# Patient Record
Sex: Female | Born: 1995 | Hispanic: No | Marital: Married | Smoking: Never smoker
Health system: Southern US, Community
[De-identification: ages and names within clinical notes are randomized; demographics above are authoritative.]

## PROBLEM LIST (undated history)

## (undated) ENCOUNTER — Inpatient Hospital Stay: Payer: Self-pay

## (undated) DIAGNOSIS — J45909 Unspecified asthma, uncomplicated: Secondary | ICD-10-CM

## (undated) DIAGNOSIS — O24419 Gestational diabetes mellitus in pregnancy, unspecified control: Secondary | ICD-10-CM

## (undated) DIAGNOSIS — L409 Psoriasis, unspecified: Secondary | ICD-10-CM

## (undated) DIAGNOSIS — L301 Dyshidrosis [pompholyx]: Secondary | ICD-10-CM

## (undated) DIAGNOSIS — K8050 Calculus of bile duct without cholangitis or cholecystitis without obstruction: Secondary | ICD-10-CM

## (undated) DIAGNOSIS — K810 Acute cholecystitis: Secondary | ICD-10-CM

## (undated) DIAGNOSIS — R1011 Right upper quadrant pain: Secondary | ICD-10-CM

## (undated) HISTORY — PX: NO PAST SURGERIES: SHX2092

## (undated) HISTORY — DX: Right upper quadrant pain: R10.11

## (undated) HISTORY — DX: Acute cholecystitis: K81.0

## (undated) HISTORY — DX: Calculus of bile duct without cholangitis or cholecystitis without obstruction: K80.50

## (undated) HISTORY — DX: Gestational diabetes mellitus in pregnancy, unspecified control: O24.419

---

## 2014-08-02 ENCOUNTER — Emergency Department: Payer: Self-pay | Admitting: Emergency Medicine

## 2014-09-15 DIAGNOSIS — L409 Psoriasis, unspecified: Secondary | ICD-10-CM | POA: Insufficient documentation

## 2015-09-15 DIAGNOSIS — J4520 Mild intermittent asthma, uncomplicated: Secondary | ICD-10-CM | POA: Insufficient documentation

## 2016-05-23 LAB — OB RESULTS CONSOLE RPR: RPR: NONREACTIVE

## 2016-05-23 LAB — OB RESULTS CONSOLE GC/CHLAMYDIA
Chlamydia: NEGATIVE
GC PROBE AMP, GENITAL: NEGATIVE

## 2016-05-23 LAB — OB RESULTS CONSOLE HEPATITIS B SURFACE ANTIGEN: HEP B S AG: NEGATIVE

## 2016-05-23 LAB — OB RESULTS CONSOLE VARICELLA ZOSTER ANTIBODY, IGG: Varicella: IMMUNE

## 2016-05-23 LAB — OB RESULTS CONSOLE RUBELLA ANTIBODY, IGM: RUBELLA: IMMUNE

## 2016-09-13 LAB — OB RESULTS CONSOLE HIV ANTIBODY (ROUTINE TESTING): HIV: NONREACTIVE

## 2016-09-16 NOTE — L&D Delivery Note (Signed)
Operative Delivery Note   Delivery was on 11/20/16 at 2243  At  a viable and female  was delivered via Kielland forcep.  Presentation: vtx; Position: Left,, Occiput,, Transverse; Station: +3/3.  Verbal consent: obtained from patient.  Risks and benefits discussed in detail.  Risks include, but are not limited to the risks of anesthesia, bleeding, infection, damage to maternal tissues, fetal cephalhematoma.  There is also the risk of inability to effect vaginal delivery of the head, or shoulder dystocia that cannot be resolved by established maneuvers, leading to the need for emergency cesarean section.  APGAR: , ; weight  .   Placenta status: , .   Cord:  with the following complications: .  Cord pH:  pH cord blood (arterial) 7.210 - 7.380 7.34   pCO2 cord blood (arterial) 42.0 - 56.0 mmHg 40.0    Bicarbonate 13.0 - 22.0 mmol/L 21.6      Anesthesia: local  Instruments: kielland Episiotomy:  MLE Lacerations:  3rd degree laceration , right posterior sulcus tear Suture Repair: 2.0 3.0 vicryl Est. Blood Loss (mL):  800cc  Mom to postpartum.  Baby to Couplet care / Skin to Skin.  For a full detailed account of the procedure see dictated noted Eryanna Regal 11/21/2016, 12:10 AM

## 2016-10-03 ENCOUNTER — Emergency Department: Payer: Medicaid Other

## 2016-10-03 ENCOUNTER — Encounter: Payer: Self-pay | Admitting: Urgent Care

## 2016-10-03 ENCOUNTER — Inpatient Hospital Stay
Admission: EM | Admit: 2016-10-03 | Discharge: 2016-10-05 | DRG: 781 | Disposition: A | Discharge status: Home / Self Care | Payer: Medicaid Other | Attending: Surgery | Admitting: Surgery

## 2016-10-03 DIAGNOSIS — K810 Acute cholecystitis: Secondary | ICD-10-CM | POA: Diagnosis present

## 2016-10-03 DIAGNOSIS — O99713 Diseases of the skin and subcutaneous tissue complicating pregnancy, third trimester: Secondary | ICD-10-CM | POA: Diagnosis present

## 2016-10-03 DIAGNOSIS — O99613 Diseases of the digestive system complicating pregnancy, third trimester: Principal | ICD-10-CM | POA: Diagnosis present

## 2016-10-03 DIAGNOSIS — K8042 Calculus of bile duct with acute cholecystitis without obstruction: Secondary | ICD-10-CM | POA: Diagnosis present

## 2016-10-03 DIAGNOSIS — R1010 Upper abdominal pain, unspecified: Secondary | ICD-10-CM

## 2016-10-03 DIAGNOSIS — R1011 Right upper quadrant pain: Secondary | ICD-10-CM

## 2016-10-03 DIAGNOSIS — J45909 Unspecified asthma, uncomplicated: Secondary | ICD-10-CM | POA: Diagnosis present

## 2016-10-03 DIAGNOSIS — Z3A31 31 weeks gestation of pregnancy: Secondary | ICD-10-CM

## 2016-10-03 DIAGNOSIS — L309 Dermatitis, unspecified: Secondary | ICD-10-CM | POA: Diagnosis present

## 2016-10-03 DIAGNOSIS — L409 Psoriasis, unspecified: Secondary | ICD-10-CM | POA: Diagnosis present

## 2016-10-03 DIAGNOSIS — Z7952 Long term (current) use of systemic steroids: Secondary | ICD-10-CM

## 2016-10-03 DIAGNOSIS — K8050 Calculus of bile duct without cholangitis or cholecystitis without obstruction: Secondary | ICD-10-CM

## 2016-10-03 DIAGNOSIS — O99513 Diseases of the respiratory system complicating pregnancy, third trimester: Secondary | ICD-10-CM | POA: Diagnosis present

## 2016-10-03 DIAGNOSIS — K219 Gastro-esophageal reflux disease without esophagitis: Secondary | ICD-10-CM | POA: Diagnosis present

## 2016-10-03 DIAGNOSIS — K838 Other specified diseases of biliary tract: Secondary | ICD-10-CM

## 2016-10-03 HISTORY — DX: Psoriasis, unspecified: L40.9

## 2016-10-03 HISTORY — DX: Unspecified asthma, uncomplicated: J45.909

## 2016-10-03 HISTORY — DX: Dyshidrosis (pompholyx): L30.1

## 2016-10-03 LAB — COMPREHENSIVE METABOLIC PANEL
ALT: 10 U/L — ABNORMAL LOW (ref 14–54)
ANION GAP: 7 (ref 5–15)
AST: 17 U/L (ref 15–41)
Albumin: 2.9 g/dL — ABNORMAL LOW (ref 3.5–5.0)
Alkaline Phosphatase: 126 U/L (ref 38–126)
BILIRUBIN TOTAL: 0.3 mg/dL (ref 0.3–1.2)
BUN: <5 mg/dL — ABNORMAL LOW (ref 6–20)
CHLORIDE: 109 mmol/L (ref 101–111)
CO2: 21 mmol/L — ABNORMAL LOW (ref 22–32)
Calcium: 8.7 mg/dL — ABNORMAL LOW (ref 8.9–10.3)
Creatinine, Ser: 0.35 mg/dL — ABNORMAL LOW (ref 0.44–1.00)
GFR calc Af Amer: >60 mL/min (ref >60)
GFR calc non Af Amer: >60 mL/min (ref >60)
GLUCOSE: 92 mg/dL (ref 65–99)
POTASSIUM: 3.2 mmol/L — AB (ref 3.5–5.1)
Sodium: 137 mmol/L (ref 135–145)
TOTAL PROTEIN: 6.4 g/dL — AB (ref 6.5–8.1)

## 2016-10-03 LAB — CBC
HEMATOCRIT: 35 % (ref 35.0–47.0)
Hemoglobin: 12.1 g/dL (ref 12.0–16.0)
MCH: 30.1 pg (ref 26.0–34.0)
MCHC: 34.6 g/dL (ref 32.0–36.0)
MCV: 86.9 fL (ref 80.0–100.0)
PLATELETS: 193 10*3/uL (ref 150–440)
RBC: 4.03 MIL/uL (ref 3.80–5.20)
RDW: 14 % (ref 11.5–14.5)
WBC: 13.1 10*3/uL — ABNORMAL HIGH (ref 3.6–11.0)

## 2016-10-03 LAB — TROPONIN I

## 2016-10-03 LAB — LIPASE, BLOOD: Lipase: 24 U/L (ref 11–51)

## 2016-10-03 MED ORDER — GI COCKTAIL ~~LOC~~
ORAL | Status: AC
  Filled 2016-10-03: qty 30

## 2016-10-03 MED ORDER — MORPHINE SULFATE (PF) 2 MG/ML IV SOLN
2.0000 mg | Freq: Once | INTRAVENOUS | Status: AC
  Administered 2016-10-03: 2 mg via INTRAVENOUS
  Filled 2016-10-03: qty 1

## 2016-10-03 MED ORDER — PROMETHAZINE HCL 25 MG/ML IJ SOLN
12.5000 mg | Freq: Once | INTRAMUSCULAR | Status: AC
  Administered 2016-10-03: 12.5 mg via INTRAVENOUS
  Filled 2016-10-03: qty 1

## 2016-10-03 MED ORDER — MORPHINE SULFATE (PF) 4 MG/ML IV SOLN
4.0000 mg | Freq: Once | INTRAVENOUS | Status: AC
  Administered 2016-10-04: 4 mg via INTRAVENOUS
  Filled 2016-10-03: qty 1

## 2016-10-03 NOTE — ED Notes (Signed)
Patient transported to Ultrasound 

## 2016-10-03 NOTE — ED Triage Notes (Addendum)
Patient presents with c/o RUQ abdominal paint with (+) radiation into epigastric and and up into center of chest. Pain started approximately 1 hour ago. Patient with referred pain to RIGHT shoulder. (+) N/V and shortness of breath reported. Patient is [place]; for religious reasons, she would not allow EKG to be done in triage citing that she could not remove her clothing or wrap in front of a female. Patient is an approximate 588 month primigravid female.

## 2016-10-03 NOTE — ED Provider Notes (Addendum)
[facility] Emergency Department Provider Note   ____________________________________________   First MD Initiated Contact with Patient 10/03/16 2137     (approximate)  I have reviewed the triage vital signs and the nursing notes.   HISTORY  Chief Complaint Abdominal Pain    HPI Cheryl Wong is a 21 y.o. female comes in with family reports that she had a grilled chicken sandwich and then vomited and developed pain in the epigastric area rotated radiating through to the back into the right shoulder. Pain is severe seems to be sharp and is associated with nausea area patient is 8 months pregnant. Patient reports the baby is moving normally   History reviewed. No pertinent past medical history.  There are no active problems to display for this patient.   History reviewed. No pertinent surgical history.  Prior to Admission medications   Not on File    Allergies Patient has no known allergies.  No family history on file.  Social History Social History  Substance Use Topics  . Smoking status: Never Smoker  . Smokeless tobacco: Never Used  . Alcohol use No    Review of Systems Constitutional: No fever/chills Eyes: No visual changes. ENT: No sore throat. Cardiovascular: Denies chest pain. Respiratory: Denies shortness of breath. Gastrointestinal:See history of present illness Genitourinary: Negative for dysuria. Musculoskeletal: Negative for back pain. Skin: Negative for rash. Neurological: Negative for headaches, focal weakness or numbness.  10-point ROS otherwise negative.  ____________________________________________   PHYSICAL EXAM:  VITAL SIGNS: ED Triage Vitals  Enc Vitals Group     BP 10/03/16 2132 119/73     Pulse Rate 10/03/16 2132 99     Resp 10/03/16 2132 20     Temp 10/03/16 2132 97.9 F (36.6 C)     Temp Source 10/03/16 2132 Oral     SpO2 10/03/16 2132 97 %     Weight 10/03/16 2128 155 lb (70.3 kg)     Height  10/03/16 2128 4\' 11"  (1.499 m)     Head Circumference --      Peak Flow --      Pain Score 10/03/16 2129 10     Pain Loc --      Pain Edu? --      Excl. in GC? --    Constitutional: Alert and oriented. Well appearing and in no acute distress. Eyes: Conjunctivae are normal. PERRL. EOMI. Head: Atraumatic. Nose: No congestion/rhinnorhea. Mouth/Throat: Mucous membranes are moist.  Oropharynx non-erythematous. Neck: No stridor.  Cardiovascular: Normal rate, regular rhythm. Grossly normal heart sounds.  Good peripheral circulation. Respiratory: Normal respiratory effort.  No retractions. Lungs CTAB. Gastrointestinal: Soft Tender in the epigastric area to palpation percussion. Deep breathing while palpating in the epigastrium and right upper quadrant causes the patient stopped breathing because of pain No distention. No abdominal bruits. Marland Kitchen. there is no tenderness over the uterus Musculoskeletal: No lower extremity tenderness nor edema.  No joint effusions. Neurologic:  Normal speech and language. No gross focal neurologic deficits are appreciated. No gait instability. Skin:  Skin is warm, dry and intact. No rash noted.   ____________________________________________   LABS (all labs ordered are listed, but only abnormal results are displayed)  Labs Reviewed  CBC - Abnormal; Notable for the following:       Result Value   WBC 13.1 (*)    All other components within normal limits  COMPREHENSIVE METABOLIC PANEL - Abnormal; Notable for the following:    Potassium 3.2 (*)  CO2 21 (*)    BUN <5 (*)    Creatinine, Ser 0.35 (*)    Calcium 8.7 (*)    Total Protein 6.4 (*)    Albumin 2.9 (*)    ALT 10 (*)    All other components within normal limits  TROPONIN I  LIPASE, BLOOD   ____________________________________________  EKG EKG read and interpreted by me shows normal sinus rhythm rate of 92 normal axis no acute ST-T wave  changes ____________________________________________  RADIOLOGY  dy Result   CLINICAL DATA:  RIGHT upper quadrant pain.  EXAM: US ABDOMEN LIMITED - RIGHT UPPER QUADRANT  COMPARISON:  None.  FINDINGS: Gallbladder:  Echogenic mobile 3.1 x 2.5 x 2.1 cm collection of tiny gallstones versus gallbladder sludge with intermediate acoustic shadowing. No gallbladder wall thickening or gallbladder distention. No pericholecystic fluid. No sonographic Murphy's sign elicited.  Common bile duct:  Diameter: 3 mm  Liver:  No focal lesion identified. Within normal limits in parenchymal echogenicity.  Included view of the RIGHT kidney demonstrates mild hydronephrosis.  IMPRESSION: 3.1 cm gallbladder sludge ball versus collection of small gallstones without sonographic findings of acute cholecystitis.  Incidental note of mild RIGHT hydronephrosis.   Electronically Signed   By: Awilda Metro M.D.   On: 10/03/2016 22:38    ____________________________________________   PROCEDURES  Procedure(s) performed:  Procedures  Critical Care performed:   ____________________________________________   INITIAL IMPRESSION / ASSESSMENT AND PLAN / ED COURSE  Pertinent labs & imaging results that were available during my care of the patient were reviewed by me and considered in my medical decision making (see chart for details).  Surgeon is coming to evaluate the patient. Disposition per the surgeon.      ____________________________________________   FINAL CLINICAL IMPRESSION(S) / ED DIAGNOSES  Final diagnoses:  Pain of upper abdomen      NEW MEDICATIONS STARTED DURING THIS VISIT:  New Prescriptions   No medications on file     Note:  This document was prepared using Dragon voice recognition software and may include unintentional dictation errors.    Arnaldo Natal, MD 10/03/16 [phone] Surgeon comes down to see the patient wants to wait for couple  hours to see how she does. Patient is reluctant to be admitted to the hospital and want to stay in the ER so Dr. Zenda Alpers will The patient for a couple hours until the surgeon can reexamine her and decide on admission or discharge home.   Arnaldo Natal, MD 10/04/16 [phone]

## 2016-10-04 ENCOUNTER — Encounter: Payer: Self-pay | Admitting: Obstetrics and Gynecology

## 2016-10-04 DIAGNOSIS — K219 Gastro-esophageal reflux disease without esophagitis: Secondary | ICD-10-CM | POA: Diagnosis present

## 2016-10-04 DIAGNOSIS — J45909 Unspecified asthma, uncomplicated: Secondary | ICD-10-CM | POA: Diagnosis present

## 2016-10-04 DIAGNOSIS — L409 Psoriasis, unspecified: Secondary | ICD-10-CM | POA: Diagnosis present

## 2016-10-04 DIAGNOSIS — K8042 Calculus of bile duct with acute cholecystitis without obstruction: Secondary | ICD-10-CM | POA: Diagnosis present

## 2016-10-04 DIAGNOSIS — R109 Unspecified abdominal pain: Secondary | ICD-10-CM | POA: Diagnosis present

## 2016-10-04 DIAGNOSIS — R1011 Right upper quadrant pain: Secondary | ICD-10-CM | POA: Diagnosis not present

## 2016-10-04 DIAGNOSIS — K810 Acute cholecystitis: Secondary | ICD-10-CM | POA: Diagnosis not present

## 2016-10-04 DIAGNOSIS — Z7952 Long term (current) use of systemic steroids: Secondary | ICD-10-CM | POA: Diagnosis not present

## 2016-10-04 DIAGNOSIS — L309 Dermatitis, unspecified: Secondary | ICD-10-CM | POA: Diagnosis present

## 2016-10-04 DIAGNOSIS — K8050 Calculus of bile duct without cholangitis or cholecystitis without obstruction: Secondary | ICD-10-CM

## 2016-10-04 DIAGNOSIS — O99513 Diseases of the respiratory system complicating pregnancy, third trimester: Secondary | ICD-10-CM | POA: Diagnosis present

## 2016-10-04 DIAGNOSIS — O99613 Diseases of the digestive system complicating pregnancy, third trimester: Secondary | ICD-10-CM | POA: Diagnosis present

## 2016-10-04 DIAGNOSIS — Z3A31 31 weeks gestation of pregnancy: Secondary | ICD-10-CM | POA: Diagnosis not present

## 2016-10-04 DIAGNOSIS — O99713 Diseases of the skin and subcutaneous tissue complicating pregnancy, third trimester: Secondary | ICD-10-CM | POA: Diagnosis present

## 2016-10-04 HISTORY — DX: Acute cholecystitis: K81.0

## 2016-10-04 LAB — CBC WITH DIFFERENTIAL/PLATELET
Basophils Absolute: 0.1 10*3/uL (ref 0–0.1)
Basophils Relative: 1 %
EOS PCT: 1 %
Eosinophils Absolute: 0.1 10*3/uL (ref 0–0.7)
HEMATOCRIT: 33.9 % — AB (ref 35.0–47.0)
Hemoglobin: 11.8 g/dL — ABNORMAL LOW (ref 12.0–16.0)
LYMPHS ABS: 1.5 10*3/uL (ref 1.0–3.6)
LYMPHS PCT: 14 %
MCH: 30.4 pg (ref 26.0–34.0)
MCHC: 34.8 g/dL (ref 32.0–36.0)
MCV: 87.1 fL (ref 80.0–100.0)
Monocytes Absolute: 0.7 10*3/uL (ref 0.2–0.9)
Monocytes Relative: 6 %
NEUTROS ABS: 8.3 10*3/uL — AB (ref 1.4–6.5)
NEUTROS PCT: 78 %
PLATELETS: 179 10*3/uL (ref 150–440)
RBC: 3.9 MIL/uL (ref 3.80–5.20)
RDW: 13.5 % (ref 11.5–14.5)
WBC: 10.6 10*3/uL (ref 3.6–11.0)

## 2016-10-04 LAB — BASIC METABOLIC PANEL
ANION GAP: 7 (ref 5–15)
BUN: <5 mg/dL — ABNORMAL LOW (ref 6–20)
CALCIUM: 8.6 mg/dL — AB (ref 8.9–10.3)
CO2: 21 mmol/L — ABNORMAL LOW (ref 22–32)
Chloride: 110 mmol/L (ref 101–111)
Creatinine, Ser: 0.3 mg/dL — ABNORMAL LOW (ref 0.44–1.00)
GFR calc Af Amer: >60 mL/min (ref >60)
GLUCOSE: 76 mg/dL (ref 65–99)
POTASSIUM: 3 mmol/L — AB (ref 3.5–5.1)
Sodium: 138 mmol/L (ref 135–145)

## 2016-10-04 LAB — MAGNESIUM: MAGNESIUM: 1.5 mg/dL — AB (ref 1.7–2.4)

## 2016-10-04 MED ORDER — ALBUTEROL SULFATE (2.5 MG/3ML) 0.083% IN NEBU: 3.0000 mL | INHALATION_SOLUTION | Freq: Four times a day (QID) | RESPIRATORY_TRACT | Status: DC | PRN

## 2016-10-04 MED ORDER — FAMOTIDINE IN NACL 20-0.9 MG/50ML-% IV SOLN
20.0000 mg | Freq: Two times a day (BID) | INTRAVENOUS | Status: DC
  Administered 2016-10-04: 20 mg via INTRAVENOUS

## 2016-10-04 MED ORDER — LACTATED RINGERS IV SOLN
INTRAVENOUS | Status: DC
  Administered 2016-10-04 – 2016-10-05 (×5): via INTRAVENOUS

## 2016-10-04 MED ORDER — ACETAMINOPHEN 500 MG PO TABS
1000.0000 mg | ORAL_TABLET | Freq: Four times a day (QID) | ORAL | Status: DC
  Administered 2016-10-04 – 2016-10-05 (×3): 1000 mg via ORAL
  Filled 2016-10-04 (×3): qty 2

## 2016-10-04 MED ORDER — HYDROMORPHONE HCL 1 MG/ML IJ SOLN: 0.5000 mg | INTRAMUSCULAR | Status: DC | PRN

## 2016-10-04 MED ORDER — HYDROMORPHONE HCL 1 MG/ML IJ SOLN
0.5000 mg | Freq: Once | INTRAMUSCULAR | Status: AC
  Administered 2016-10-04: 0.5 mg via INTRAVENOUS

## 2016-10-04 MED ORDER — SODIUM CHLORIDE 0.9 % IV SOLN
3.0000 g | Freq: Four times a day (QID) | INTRAVENOUS | Status: DC
  Administered 2016-10-04 – 2016-10-05 (×6): 3 g via INTRAVENOUS
  Filled 2016-10-04 (×9): qty 3

## 2016-10-04 MED ORDER — HYDROMORPHONE HCL 1 MG/ML IJ SOLN
INTRAMUSCULAR | Status: AC
  Administered 2016-10-04: 0.5 mg via INTRAVENOUS
  Filled 2016-10-04: qty 1

## 2016-10-04 MED ORDER — POTASSIUM CHLORIDE CRYS ER 20 MEQ PO TBCR
40.0000 meq | EXTENDED_RELEASE_TABLET | Freq: Once | ORAL | Status: AC
  Administered 2016-10-04: 40 meq via ORAL
  Filled 2016-10-04: qty 2

## 2016-10-04 MED ORDER — MAGNESIUM SULFATE 2 GM/50ML IV SOLN
2.0000 g | Freq: Once | INTRAVENOUS | Status: AC
  Administered 2016-10-04: 2 g via INTRAVENOUS
  Filled 2016-10-04: qty 50

## 2016-10-04 MED ORDER — ACETAMINOPHEN 500 MG PO TABS
1000.0000 mg | ORAL_TABLET | Freq: Four times a day (QID) | ORAL | Status: DC
  Administered 2016-10-04 (×3): 1000 mg via ORAL
  Filled 2016-10-04 (×3): qty 2

## 2016-10-04 MED ORDER — ENOXAPARIN SODIUM 40 MG/0.4ML ~~LOC~~ SOLN
40.0000 mg | SUBCUTANEOUS | Status: DC
  Administered 2016-10-04 – 2016-10-05 (×2): 40 mg via SUBCUTANEOUS
  Filled 2016-10-04 (×2): qty 0.4

## 2016-10-04 MED ORDER — HYDROMORPHONE HCL 1 MG/ML PO LIQD: 0.5000 mg | Freq: Once | ORAL | Status: DC

## 2016-10-04 MED ORDER — FAMOTIDINE IN NACL 20-0.9 MG/50ML-% IV SOLN
INTRAVENOUS | Status: AC
  Administered 2016-10-04: 20 mg via INTRAVENOUS
  Filled 2016-10-04: qty 50

## 2016-10-04 MED ORDER — PANTOPRAZOLE SODIUM 40 MG PO TBEC
40.0000 mg | DELAYED_RELEASE_TABLET | Freq: Every day | ORAL | Status: DC
  Administered 2016-10-04 – 2016-10-05 (×2): 40 mg via ORAL
  Filled 2016-10-04 (×2): qty 1

## 2016-10-04 MED ORDER — ACETAMINOPHEN 500 MG PO TABS
ORAL_TABLET | ORAL | Status: AC
  Administered 2016-10-04: 1000 mg via ORAL
  Filled 2016-10-04: qty 2

## 2016-10-04 MED ORDER — ONDANSETRON HCL 4 MG/2ML IJ SOLN: 4.0000 mg | Freq: Four times a day (QID) | INTRAMUSCULAR | Status: DC | PRN

## 2016-10-04 MED ORDER — ONDANSETRON 4 MG PO TBDP: 4.0000 mg | ORAL_TABLET | Freq: Four times a day (QID) | ORAL | Status: DC | PRN

## 2016-10-04 NOTE — H&P (Signed)
Date of Admission:  10/04/2016  Reason for Admission:  Abdominal pain  History of Present Illness: Cheryl Wong is a 21 y.o. female who presents with a one day history of abdominal pain.  Patient reports that she started having abdominal pain in the epigastric and right upper quadrant areas last night around 8-9 pm.  She had a chicken salad sandwich for dinner.  Associated with nausea and one episode of emesis prior to arrival to ED.  Reports she feels a stabbing type of pain, radiating towards the back as well.  No prior episodes of pain like this in the past.  Denies any fevers, chills, chest pain, shortness of breath, other areas or abdominal pain, dysuria, hematuria, blood in the stool.  Patient is 8 months pregnant and has her OB care at a community [facility] center.  Denies any complications with her pregnancy.  Past Medical History: History reviewed. No pertinent past medical history.   Past Surgical History: History reviewed. No pertinent surgical history.  Home Medications: Prior to Admission medications   Not on File    Allergies: No Known Allergies  Social History:  reports that she has never smoked. She has never used smokeless tobacco. She reports that she does not drink alcohol. Her drug history is not on file.   Family History: No family history on file.  Review of Systems: Review of Systems  Constitutional: Negative for chills and fever.  HENT: Negative for hearing loss.   Eyes: Negative for blurred vision.  Respiratory: Negative for cough and shortness of breath.   Cardiovascular: Negative for chest pain and leg swelling.  Gastrointestinal: Positive for abdominal pain, nausea and vomiting. Negative for constipation, diarrhea and heartburn.  Genitourinary: Negative for dysuria and hematuria.  Musculoskeletal: Negative for myalgias.  Skin: Negative for rash.  Neurological: Negative for dizziness.  Psychiatric/Behavioral: Negative for depression.  All other  systems reviewed and are negative.   Physical Exam BP 119/70 (BP Location: Right Arm)   Pulse 95   Temp 97.9 F (36.6 C) (Oral)   Resp 18   Ht 4\' 11"  (1.499 m)   Wt 70.3 kg (155 lb)   SpO2 100%   BMI 31.31 kg/m  CONSTITUTIONAL: No acute distress HEENT:  Normocephalic, atraumatic, extraocular motion intact. NECK: Trachea is midline, and there is no jugular venous distension. RESPIRATORY:  Lungs are clear, and breath sounds are equal bilaterally. Normal respiratory effort without pathologic use of accessory muscles. CARDIOVASCULAR: Heart is regular without murmurs, gallops, or rubs. GI: The abdomen is soft, nondistended, tender to palpation in the epigastric and right upper quadrant.  Positive Murphy's sign.  Uterus size consistent with 8 month pregnancy. MUSCULOSKELETAL:  Normal muscle strength and tone in all four extremities.  No peripheral edema or cyanosis. SKIN: Skin turgor is normal. There are no pathologic skin lesions.  NEUROLOGIC:  Motor and sensation is grossly normal.  Cranial nerves are grossly intact. PSYCH:  Alert and oriented to person, place and time. Affect is normal.  Laboratory Analysis: Results for orders placed or performed during the hospital encounter of 10/03/16 (from the past 24 hour(s))  CBC     Status: Abnormal   Collection Time: 10/03/16  9:45 PM  Result Value Ref Range   WBC 13.1 (H) 3.6 - 11.0 K/uL   RBC 4.03 3.80 - 5.20 MIL/uL   Hemoglobin 12.1 12.0 - 16.0 g/dL   HCT 40.935.0 81.135.0 - 91.447.0 %   MCV 86.9 80.0 - 100.0 fL   MCH 30.1 26.0 -  34.0 pg   MCHC 34.6 32.0 - 36.0 g/dL   RDW 16.1 09.6 - 04.5 %   Platelets 193 150 - 440 K/uL  Comprehensive metabolic panel     Status: Abnormal   Collection Time: 10/03/16  9:45 PM  Result Value Ref Range   Sodium 137 135 - 145 mmol/L   Potassium 3.2 (L) 3.5 - 5.1 mmol/L   Chloride 109 101 - 111 mmol/L   CO2 21 (L) 22 - 32 mmol/L   Glucose, Bld 92 65 - 99 mg/dL   BUN <5 (L) 6 - 20 mg/dL   Creatinine, Ser 4.09 (L)  0.44 - 1.00 mg/dL   Calcium 8.7 (L) 8.9 - 10.3 mg/dL   Total Protein 6.4 (L) 6.5 - 8.1 g/dL   Albumin 2.9 (L) 3.5 - 5.0 g/dL   AST 17 15 - 41 U/L   ALT 10 (L) 14 - 54 U/L   Alkaline Phosphatase 126 38 - 126 U/L   Total Bilirubin 0.3 0.3 - 1.2 mg/dL   GFR calc non Af Amer >60 >60 mL/min   GFR calc Af Amer >60 >60 mL/min   Anion gap 7 5 - 15  Troponin I     Status: None   Collection Time: 10/03/16  9:45 PM  Result Value Ref Range   Troponin I <0.03 <0.03 ng/mL  Lipase, blood     Status: None   Collection Time: 10/03/16  9:45 PM  Result Value Ref Range   Lipase 24 11 - 51 U/L    Imaging: US Abdomen Limited Ruq  Result Date: 10/03/2016 CLINICAL DATA:  RIGHT upper quadrant pain. EXAM: US ABDOMEN LIMITED - RIGHT UPPER QUADRANT COMPARISON:  None. FINDINGS: Gallbladder: Echogenic mobile 3.1 x 2.5 x 2.1 cm collection of tiny gallstones versus gallbladder sludge with intermediate acoustic shadowing. No gallbladder wall thickening or gallbladder distention. No pericholecystic fluid. No sonographic Murphy's sign elicited. Common bile duct: Diameter: 3 mm Liver: No focal lesion identified. Within normal limits in parenchymal echogenicity. Included view of the RIGHT kidney demonstrates mild hydronephrosis. IMPRESSION: 3.1 cm gallbladder sludge ball versus collection of small gallstones without sonographic findings of acute cholecystitis. Incidental note of mild RIGHT hydronephrosis. Electronically Signed   By: Awilda Metro M.D.   On: 10/03/2016 22:38    Assessment and Plan: This is a 21 y.o. female who presents with likely acute cholecystitis. I have personally reviewed her laboratory and imaging studies and discussed them with the patient. There is not much gallbladder wall thickening or pericholecystic fluid but she does have a positive Murphy's sign and symptoms consistent with biliary etiology.  Her WBC is 13.1, but she is pregnant.    Had a lengthy discussion with the patient.  Initially  the patient was hesitant to be admitted but her pain was not improving despite of multiple doses of pain medication and I informed her that going home with a possible infection untreated or undertreated would not be safe for the fetus and recommended admission to the hospital.  After discussing with her family, she agreed to admission.  She will be admitted to General Surgery.  Will be NPO with IVF hydration, appropriate pain and nausea control, and will be started on IV Unasyn for possible cholecystitis.  Have discussed with the patient that conservative management could be attempted first with antibiotics and evaluate her progression.  If there is no improvement, she may require a cholecystectomy, which may need to be open given her larger uterine size.  The patient  does understand this plan and all of her questions have been answered.   Howie Ill, MD [facility]

## 2016-10-04 NOTE — Progress Notes (Signed)
Interval note:  [facility] and are on patient's chart.  She is a G1P0 at 31 weeks today, dated by exact LMP of 03/01/17.   Normal anatomy US on 07/15/16, confirming dates.  OB care complicated by: severe eczema with chronic topical steroid use, asthma, and psoriasis  Otherwise, normal prenatal course.   FHTs: 149 bpm  Carlean JewsMeredith Devinn Hurwitz, CNM

## 2016-10-04 NOTE — Progress Notes (Signed)
Tolerating po intake of clear liquids well , Void qs , No emesis , pain controlled by tylenol , IV fluids and antibiotics continued treatment , Next shift report given to Carmon Ginsberg Smith RN.

## 2016-10-04 NOTE — Progress Notes (Signed)
[age]yr old female with RUQ pain and concern for possible acute cholecystitis verse biliary colic.  She now states no pain and feeling much better after having Unasyn. She denies any nausea.   Vitals:   10/04/16 0312 10/04/16 0831  BP: 120/74 109/72  Pulse: 95 99  Resp: 18 18  Temp: 98.5 F (36.9 C) 98 F (36.7 C)   I/O last 3 completed shifts: In: 50 [IV Piggyback:50] Out: 250 [Urine:250] No intake/output data recorded.   PE:  Gen: NAD Abd: 8 months pregnant with fundus need epigastrium, non tender, now no Murphy's sign Ext: no edema  CBC Latest Ref Rng & Units 10/04/2016 10/03/2016  WBC 3.6 - 11.0 K/uL 10.6 13.1(H)  Hemoglobin 12.0 - 16.0 g/dL 11.8(L) 12.1  Hematocrit 35.0 - 47.0 % 33.9(L) 35.0  Platelets 150 - 440 K/uL 179 193   CMP Latest Ref Rng & Units 10/04/2016 10/03/2016  Glucose 65 - 99 mg/dL 76 92  BUN 6 - 20 mg/dL <1(O<5(L) <1(W<5(L)  Creatinine 0.44 - 1.00 mg/dL 9.60(A0.30(L) 5.40(J0.35(L)  Sodium 135 - 145 mmol/L 138 137  Potassium 3.5 - 5.1 mmol/L 3.0(L) 3.2(L)  Chloride 101 - 111 mmol/L 110 109  CO2 22 - 32 mmol/L 21(L) 21(L)  Calcium 8.9 - 10.3 mg/dL 8.1(X8.6(L) 9.1(Y8.7(L)  Total Protein 6.5 - 8.1 g/dL - 6.4(L)  Total Bilirubin 0.3 - 1.2 mg/dL - 0.3  Alkaline Phos 38 - 126 U/L - 126  AST 15 - 41 U/L - 17  ALT 14 - 54 U/L - 10(L)   A/P:  [age]yr old female with RUQ pain and concern for possible acute cholecystitis verse biliary colic.   WBC improved as well as pain, will start on clear liquids and give regular diet this PM if tolerates well.  She wishes to see only female physicians if possible due to religious reasons.  [facility] and we have placed a consult to Ob in the cas that she were to worsen.

## 2016-10-04 NOTE — ED Provider Notes (Signed)
-----------------------------------------   1:45 AM on 10/04/2016 -----------------------------------------   Blood pressure 119/70, pulse 95, temperature 97.9 F (36.6 C), temperature source Oral, resp. rate 18, height 4\' 11"  (1.499 m), weight 155 lb (70.3 kg), SpO2 100 %.  Assuming care from Dr. Darnelle CatalanMalinda.  In short, Cheryl Wong is a 21 y.o. female with a chief complaint of Abdominal Pain .  Refer to the original H&P for additional details.  The current plan of care is to have the patient evaluated by surgery with the plan for possible admission.  Surgery came to see the patient and they initially stated that they did not want to stay they were unsure if they work and if they are not but the patient developed some more pain so she decided to stay in the hospital. The patient be admitted to the surgical service.        Rebecka ApleyAllison P Caoimhe Damron, MD 10/04/16 [phone]

## 2016-10-04 NOTE — H&P (Signed)
ANTEPARTUM Consult Note   History of Present Illness: Cheryl Wong is a 21 y.o. G1P0 at 31+6 weeks admitted under General Surgery service for acute cholecystitis.  She receives obstet[facility].  She currently denies pain, nausea, vomiting. Her current treatment includes: scheduled IV Unasyn and Pepcid, Magnesium and Potassium replacement.  She has not required IV pain medication or anti-emetics since she has been on the unit.   Patient reports the fetal movement as active. Patient reports uterine contraction  activity as none. Patient reports  vaginal bleeding as none. Patient describes fluid per vagina as None.  Patient Active Problem List   Diagnosis Date Noted  . Acute cholecystitis 10/04/2016  . RUQ pain     History reviewed. No pertinent past medical history.  History reviewed. No pertinent surgical history.  OB History  Gravida Para Term Preterm AB Living  1            SAB TAB Ectopic Multiple Live Births               # Outcome Date GA Lbr Len/2nd Weight Sex Delivery Anes PTL Lv  1 Current               Social History   Social History  . Marital status: Married    Spouse name: N/A  . Number of children: N/A  . Years of education: N/A   Social History Main Topics  . Smoking status: Never Smoker  . Smokeless tobacco: Never Used  . Alcohol use No  . Drug use: Unknown  . Sexual activity: Not Asked   Other Topics Concern  . None   Social History Narrative  . None    No family history on file.  No Known Allergies  No prescriptions prior to admission.    Review of Systems - General ROS: negative Respiratory ROS: no cough, shortness of breath, or wheezing Cardiovascular ROS: no chest pain or dyspnea on exertion Gastrointestinal ROS: no abdominal pain, change in bowel habits, or black or bloody stools Genito-Urinary ROS: no dysuria, trouble voiding, or hematuria Musculoskeletal ROS: negative Neurological ROS:  negative  Vitals:  BP 109/72 (BP Location: Right Arm)   Pulse 99   Temp 98 F (36.7 C) (Oral)   Resp 18   Ht 4\' 11"  (1.499 m)   Wt 70.3 kg (155 lb)   SpO2 99%   BMI 31.31 kg/m  Physical Examination: CONSTITUTIONAL: Well-developed, well-nourished female in no acute distress.  HENT:  Normocephalic, atraumatic NECK: Normal range of motion, supple, no masses SKIN: Skin is warm and dry. No rash noted. Not diaphoretic. No erythema. No pallor. NEUROLGIC: Alert and oriented to person, place, and time. Normal reflexes, muscle tone coordination. No cranial nerve deficit noted. PSYCHIATRIC: Normal mood and affect. Normal behavior. Normal judgment and thought content. CARDIOVASCULAR: Normal heart rate noted, regular rhythm RESPIRATORY: Effort and breath sounds normal, no problems with respiration noted ABDOMEN: Soft, nontender, nondistended, gravid. UTERUS: Gravid, non-tender  MUSCULOSKELETAL: Normal range of motion. No edema and no tenderness. 2+ distal pulses. SCDs on.   Cervix: Not evaluated Membranes:intact Fetal Monitoring: FHTs:  Tocometer: n/a  Labs:  Results for orders placed or performed during the hospital encounter of 10/03/16 (from the past 24 hour(s))  CBC   Collection Time: 10/03/16  9:45 PM  Result Value Ref Range   WBC 13.1 (H) 3.6 - 11.0 K/uL   RBC 4.03 3.80 - 5.20 MIL/uL   Hemoglobin 12.1 12.0 - 16.0  g/dL   HCT 16.1 09.6 - 04.5 %   MCV 86.9 80.0 - 100.0 fL   MCH 30.1 26.0 - 34.0 pg   MCHC 34.6 32.0 - 36.0 g/dL   RDW 40.9 81.1 - 91.4 %   Platelets 193 150 - 440 K/uL  Comprehensive metabolic panel   Collection Time: 10/03/16  9:45 PM  Result Value Ref Range   Sodium 137 135 - 145 mmol/L   Potassium 3.2 (L) 3.5 - 5.1 mmol/L   Chloride 109 101 - 111 mmol/L   CO2 21 (L) 22 - 32 mmol/L   Glucose, Bld 92 65 - 99 mg/dL   BUN <5 (L) 6 - 20 mg/dL   Creatinine, Ser 7.82 (L) 0.44 - 1.00 mg/dL   Calcium 8.7 (L) 8.9 - 10.3 mg/dL   Total Protein 6.4 (L) 6.5 - 8.1 g/dL    Albumin 2.9 (L) 3.5 - 5.0 g/dL   AST 17 15 - 41 U/L   ALT 10 (L) 14 - 54 U/L   Alkaline Phosphatase 126 38 - 126 U/L   Total Bilirubin 0.3 0.3 - 1.2 mg/dL   GFR calc non Af Amer >60 >60 mL/min   GFR calc Af Amer >60 >60 mL/min   Anion gap 7 5 - 15  Troponin I   Collection Time: 10/03/16  9:45 PM  Result Value Ref Range   Troponin I <0.03 <0.03 ng/mL  Lipase, blood   Collection Time: 10/03/16  9:45 PM  Result Value Ref Range   Lipase 24 11 - 51 U/L  Basic metabolic panel   Collection Time: 10/04/16  6:18 AM  Result Value Ref Range   Sodium 138 135 - 145 mmol/L   Potassium 3.0 (L) 3.5 - 5.1 mmol/L   Chloride 110 101 - 111 mmol/L   CO2 21 (L) 22 - 32 mmol/L   Glucose, Bld 76 65 - 99 mg/dL   BUN <5 (L) 6 - 20 mg/dL   Creatinine, Ser 9.56 (L) 0.44 - 1.00 mg/dL   Calcium 8.6 (L) 8.9 - 10.3 mg/dL   GFR calc non Af Amer >60 >60 mL/min   GFR calc Af Amer >60 >60 mL/min   Anion gap 7 5 - 15  Magnesium   Collection Time: 10/04/16  6:18 AM  Result Value Ref Range   Magnesium 1.5 (L) 1.7 - 2.4 mg/dL  CBC WITH DIFFERENTIAL   Collection Time: 10/04/16  6:18 AM  Result Value Ref Range   WBC 10.6 3.6 - 11.0 K/uL   RBC 3.90 3.80 - 5.20 MIL/uL   Hemoglobin 11.8 (L) 12.0 - 16.0 g/dL   HCT 21.3 (L) 08.6 - 57.8 %   MCV 87.1 80.0 - 100.0 fL   MCH 30.4 26.0 - 34.0 pg   MCHC 34.8 32.0 - 36.0 g/dL   RDW 46.9 62.9 - 52.8 %   Platelets 179 150 - 440 K/uL   Neutrophils Relative % 78 %   Neutro Abs 8.3 (H) 1.4 - 6.5 K/uL   Lymphocytes Relative 14 %   Lymphs Abs 1.5 1.0 - 3.6 K/uL   Monocytes Relative 6 %   Monocytes Absolute 0.7 0.2 - 0.9 K/uL   Eosinophils Relative 1 %   Eosinophils Absolute 0.1 0 - 0.7 K/uL   Basophils Relative 1 %   Basophils Absolute 0.1 0 - 0.1 K/uL    Imaging Studies: US Abdomen Limited Ruq  Result Date: 10/03/2016 CLINICAL DATA:  RIGHT upper quadrant pain. EXAM: US ABDOMEN LIMITED - RIGHT UPPER  QUADRANT COMPARISON:  None. FINDINGS: Gallbladder: Echogenic mobile  3.1 x 2.5 x 2.1 cm collection of tiny gallstones versus gallbladder sludge with intermediate acoustic shadowing. No gallbladder wall thickening or gallbladder distention. No pericholecystic fluid. No sonographic Murphy's sign elicited. Common bile duct: Diameter: 3 mm Liver: No focal lesion identified. Within normal limits in parenchymal echogenicity. Included view of the RIGHT kidney demonstrates mild hydronephrosis. IMPRESSION: 3.1 cm gallbladder sludge ball versus collection of small gallstones without sonographic findings of acute cholecystitis. Incidental note of mild RIGHT hydronephrosis. Electronically Signed   By: Awilda Metro M.D.   On: 10/03/2016 22:38     Assessment and Plan: Patient Active Problem List   Diagnosis Date Noted  . Acute cholecystitis 10/04/2016  . RUQ pain    Routine antenatal care WBC normal today Pain improving  FHTs every 8 hours  Request OB records from [facility]   Dr. Elesa Massed consulted and updated/collobrated for plan of care.    Thank you for the opportunity to be involved with this pt's care.   Carlean Jews, CNM

## 2016-10-05 MED ORDER — AMOXICILLIN-POT CLAVULANATE 875-125 MG PO TABS: 1.0000 | ORAL_TABLET | Freq: Two times a day (BID) | ORAL | 0 refills | Status: DC

## 2016-10-05 NOTE — Progress Notes (Signed)
All discharge instructions given to patient and she voices understanding of all instructions given. Iv d/c'd pt tolerated well, prescription given  Patient will make her own f/u appt.  Patient discharged home with spouse

## 2016-10-05 NOTE — Progress Notes (Signed)
Obstetric and Gynecology  Subjective  Cheryl Wong is a 21 y.o. female G1P0 at 31+1 weeks who presented on 10/03/2016 for acute cholecystitis vs. Bilary colic.  We were consulted by Surgery to evaluate. She denies pain after breakfast this morning.  She reports occasional epigastric discomfort, but states Tylenol and Pepcid help, and desires to home with on Pepcid.  She endorses good fetal movement.  She denies contractions, vaginal bleeding, LOF, abnormal discharge, dysuria, nausea, vomiting, constipation, diarrhea.  She expresses interest in going home today.      Objective   Vitals:   10/05/16 0345 10/05/16 0802  BP: 116/61 111/74  Pulse: 87 99  Resp: 20 18  Temp: 98.1 F (36.7 C) 98.3 F (36.8 C)     Intake/Output Summary (Last 24 hours) at 10/05/16 1107 Last data filed at 10/05/16 1021  Gross per 24 hour  Intake             7219 ml  Output             5200 ml  Net             2019 ml    General: NAD Abdomen: Benign. Non-tender, +BS, no guarding. Uterus: gravid, non-tender  Extremities: No erythema or cords, no calf tenderness, +warmth with normal peripheral pulses.  FHTs: 148 bpm  Labs: No results found for this or any previous visit (from the past 24 hour(s)).  Cultures: No results found for this or any previous visit.  Imaging: Kore[place] Abdomen Limited Ruq  Result Date: 10/03/2016 CLINICAL DATA:  RIGHT upper quadrant pain. EXAM: US ABDOMEN LIMITED - RIGHT UPPER QUADRANT COMPARISON:  None. FINDINGS: Gallbladder: Echogenic mobile 3.1 x 2.5 x 2.1 cm collection of tiny gallstones versus gallbladder sludge with intermediate acoustic shadowing. No gallbladder wall thickening or gallbladder distention. No pericholecystic fluid. No sonographic Murphy's sign elicited. Common bile duct: Diameter: 3 mm Liver: No focal lesion identified. Within normal limits in parenchymal echogenicity. Included view of the RIGHT kidney demonstrates mild hydronephrosis. IMPRESSION: 3.1 cm  gallbladder sludge ball versus collection of small gallstones without sonographic findings of acute cholecystitis. Incidental note of mild RIGHT hydronephrosis. Electronically Signed   By: Awilda Metroourtnay  Bloomer M.D.   On: 10/03/2016 22:38     Assessment   20 [facility] 3   Plan   1. Cleared by OB team - no further antenatal testing indiciated 2. Possible discharge home today by surgery  3. Continue fetal kick counts daily 4. Preterm labor warning s/s reviewed  5. [facility] 10/09/16  Dr. Elesa MassedWard consulted and updated/collobrated for plan of care.    Thank you for the opportunity to be involved with this pt's care.   Carlean JewsMeredith Sigmon, CNM

## 2016-10-05 NOTE — Progress Notes (Signed)
FHT = 148.

## 2016-10-05 NOTE — Discharge Instructions (Signed)
Low-Fat Diet for Pancreatitis or Gallbladder Conditions A low-fat diet can be helpful if you have pancreatitis or a gallbladder condition. With these conditions, your pancreas and gallbladder have trouble digesting fats. A healthy eating plan with less fat will help rest your pancreas and gallbladder and reduce your symptoms. What do I need to know about this diet?  Eat a low-fat diet. ? Reduce your fat intake to less than 20-30% of your total daily calories. This is less than 50-60 g of fat per day. ? Remember that you need some fat in your diet. Ask your dietician what your daily goal should be. ? Choose nonfat and low-fat healthy foods. Look for the words "nonfat," "low fat," or "fat free." ? As a guide, look on the label and choose foods with less than 3 g of fat per serving. Eat only one serving.  Avoid alcohol.  Do not smoke. If you need help quitting, talk with your health care provider.  Eat small frequent meals instead of three large heavy meals. What foods can I eat? Grains Include healthy grains and starches such as potatoes, wheat bread, fiber-rich cereal, and brown rice. Choose whole grain options whenever possible. In adults, whole grains should account for 45-65% of your daily calories. Fruits and Vegetables Eat plenty of fruits and vegetables. Fresh fruits and vegetables add fiber to your diet. Meats and Other Protein Sources Eat lean meat such as chicken and pork. Trim any fat off of meat before cooking it. Eggs, fish, and beans are other sources of protein. In adults, these foods should account for 10-35% of your daily calories. Dairy Choose low-fat milk and dairy options. Dairy includes fat and protein, as well as calcium. Fats and Oils Limit high-fat foods such as fried foods, sweets, baked goods, sugary drinks. Other Creamy sauces and condiments, such as mayonnaise, can add extra fat. Think about whether or not you need to use them, or use smaller amounts or low fat  options. What foods are not recommended?  High fat foods, such as: ? Baked goods. ? Ice cream. ? French toast. ? Sweet rolls. ? Pizza. ? Cheese bread. ? Foods covered with batter, butter, creamy sauces, or cheese. ? Fried foods. ? Sugary drinks and desserts.  Foods that cause gas or bloating This information is not intended to replace advice given to you by your health care provider. Make sure you discuss any questions you have with your health care provider. Document Released: 09/07/2013 Document Revised: 02/08/2016 Document Reviewed: 08/16/2013 Elsevier Interactive Patient Education  2017 Elsevier Inc.  

## 2016-10-05 NOTE — Discharge Summary (Signed)
Physician Discharge Summary  Patient ID: Cheryl Wong MRN: [id number] DOB/AGE: 14-Nov-1995 20 y.o.  Admit date: 10/03/2016 Discharge date: 10/05/2016  Admission Diagnoses: acute cholecystitis verses biliary colic  Discharge Diagnoses:  Active Problems:   Acute cholecystitis   Biliary colic   Discharged Condition: good  Hospital Course: 21 yr old female that is 8 months pregnant with biliary colic verse acute cholecystitis.  Patient has improved on antibiotics.  Discussed GERD tx with H2 blockers and PPI over the counter and avoiding acid foods.  Also discussed avoiding fatty foods and will give Augmentin at home for 7 days.  Will have her f/u in our office in 2 weeks to ensure continuing to do well.   Consults: None  Significant Diagnostic Studies: US  Treatments: antibiotics: Unasyn  Discharge Exam: Blood pressure 111/74, pulse 99, temperature 98.3 F (36.8 C), temperature source Oral, resp. rate 18, height 4\' 11"  (1.499 m), weight 155 lb (70.3 kg), SpO2 98 %. General appearance: alert, cooperative and no distress GI: 8 months pregnant, non teder  Disposition: Final discharge disposition not confirmed  Discharge Instructions    Call MD for:  persistant nausea and vomiting    Complete by:  As directed    Call MD for:  severe uncontrolled pain    Complete by:  As directed    Call MD for:  temperature >100.4    Complete by:  As directed    Diet - low sodium heart healthy    Complete by:  As directed    Increase activity slowly    Complete by:  As directed      Allergies as of 10/05/2016   No Known Allergies     Medication List    TAKE these medications   amoxicillin-clavulanate 875-125 MG tablet Commonly known as:  AUGMENTIN Take 1 tablet by mouth 2 (two) times daily.      Follow-up Information    Gladis Riffleatherine L Quintara Bost, MD. Schedule an appointment as soon as possible for a visit in 2 week(s).   Specialty:  Surgery Why:  f/u in approximately 2 weeks to ensure  improving from biliary colic  Contact information: [address] [place] [address] [phone]           Signed: Gladis RiffleCatherine L Shelton Soler 10/05/2016, 11:21 AM

## 2016-11-05 ENCOUNTER — Other Ambulatory Visit: Payer: Self-pay

## 2016-11-06 ENCOUNTER — Ambulatory Visit (INDEPENDENT_AMBULATORY_CARE_PROVIDER_SITE_OTHER): Payer: Medicaid Other | Admitting: Surgery

## 2016-11-06 ENCOUNTER — Encounter: Payer: Self-pay | Admitting: Surgery

## 2016-11-06 VITALS — BP 119/80 | HR 97 | Temp 98.5°F | Ht 59.0 in | Wt 159.6 lb

## 2016-11-06 DIAGNOSIS — K8050 Calculus of bile duct without cholangitis or cholecystitis without obstruction: Secondary | ICD-10-CM | POA: Diagnosis not present

## 2016-11-06 NOTE — Patient Instructions (Signed)
We would like for you to see Dr.Cooper after you deliver your baby. Please see your follow up appointment listed below. Please call our office if you have questions or concerns.

## 2016-11-06 NOTE — Progress Notes (Signed)
  Surgical Consultation  11/06/2016  Cheryl Wong is an 21 y.o. female.   CC: Right upper quadrant pain  HPI: This a patient who is in the emergency room and discharged from the ER with right upper quadrant pain. It was associated fatty food intolerance and workup showed gallstones versus sludge with no sign of acute cholecystitis no pericholecystic fluid and no thickened gallbladder wall. Her LFTs were normal as well.  Her pain comes on after eating but last about 15-20 minutes and then resolves completely she's had some nausea but no emesis and is been going on for 1 month she is due to deliver on March 23. She is pregnant. She denies fevers or chills.   Past Medical History:  Diagnosis Date  . Acute cholecystitis 10/04/2016  . Asthma   . Biliary colic   . Dyshidrotic eczema   . Psoriasis   . RUQ pain     No past surgical history on file.  No family history on file.  Social History:  reports that she has never smoked. She has never used smokeless tobacco. She reports that she does not drink alcohol. Her drug history is not on file.  Allergies: No Known Allergies  Medications reviewed.   Review of Systems:   Review of Systems  Constitutional: Negative for chills and fever.  HENT: Negative.   Eyes: Negative.   Respiratory: Negative.   Cardiovascular: Negative.   Gastrointestinal: Positive for abdominal pain and nausea. Negative for constipation, diarrhea and vomiting.  Genitourinary: Negative.   Musculoskeletal: Negative.   Skin: Negative.   Neurological: Negative.   Endo/Heme/Allergies: Negative.   Psychiatric/Behavioral: Negative.      Physical Exam:  There were no vitals taken for this visit.  Physical Exam  Constitutional: She is well-developed, well-nourished, and in no distress. No distress.  HENT:  Head: Normocephalic.  Eyes: Pupils are equal, round, and reactive to light. Right eye exhibits no discharge. Left eye exhibits no discharge. No scleral  icterus.  Skin: No rash noted. She is not diaphoretic. No erythema.  Vitals reviewed.  Patient is dressed in traditional Muslim garb head to toe and is uncomfortable with allowing me to perform any sort of physical examination. Other than direct visualization as above I have not examined this patient's abdomen etc.(She is experiencing no pain at this point).   No results found for this or any previous visit (from the past 48 hour(s)). No results found.  Assessment/Plan:  I was unable to examine this patient due to her religious preference and the patient's uncomfortable concerns about physical exam at this point.  Ultrasound shows gallstones without thickened gallbladder wall or pericholecystic fluid. Liver function tests are within normal limits as is the white blood cell count.  The patient is pregnant and is experiencing recurrent episodic right upper quadrant pain associated with food which is all suggestive of biliary colic. With her dietary changes to avoid fatty meals. She is taking vitamins at this point.  I would not recommend surgery in this patient at this time as her symptoms are significant but she is not at risk for malnutrition.  I would recommend seeing the patient in the immediate postpartum period to discuss surgical options at that time.  Lattie Hawichard E Cooper, MD, FACS

## 2016-11-20 ENCOUNTER — Inpatient Hospital Stay: Payer: Medicaid Other | Admitting: Anesthesiology

## 2016-11-20 ENCOUNTER — Inpatient Hospital Stay
Admission: EM | Admit: 2016-11-20 | Discharge: 2016-11-22 | DRG: 775 | Disposition: A | Discharge status: Home / Self Care | Payer: Medicaid Other | Attending: Obstetrics and Gynecology | Admitting: Obstetrics and Gynecology

## 2016-11-20 DIAGNOSIS — Z3A37 37 weeks gestation of pregnancy: Secondary | ICD-10-CM

## 2016-11-20 DIAGNOSIS — D62 Acute posthemorrhagic anemia: Secondary | ICD-10-CM | POA: Diagnosis not present

## 2016-11-20 DIAGNOSIS — O4202 Full-term premature rupture of membranes, onset of labor within 24 hours of rupture: Secondary | ICD-10-CM | POA: Diagnosis present

## 2016-11-20 DIAGNOSIS — O9081 Anemia of the puerperium: Secondary | ICD-10-CM | POA: Diagnosis not present

## 2016-11-20 LAB — COMPREHENSIVE METABOLIC PANEL
ALBUMIN: 3 g/dL — AB (ref 3.5–5.0)
ALT: 10 U/L — ABNORMAL LOW (ref 14–54)
ANION GAP: 6 (ref 5–15)
AST: 22 U/L (ref 15–41)
Alkaline Phosphatase: 201 U/L — ABNORMAL HIGH (ref 38–126)
BILIRUBIN TOTAL: 0.5 mg/dL (ref 0.3–1.2)
BUN: 5 mg/dL — ABNORMAL LOW (ref 6–20)
CO2: 21 mmol/L — ABNORMAL LOW (ref 22–32)
Calcium: 9 mg/dL (ref 8.9–10.3)
Chloride: 110 mmol/L (ref 101–111)
Creatinine, Ser: 0.35 mg/dL — ABNORMAL LOW (ref 0.44–1.00)
Glucose, Bld: 83 mg/dL (ref 65–99)
POTASSIUM: 3.7 mmol/L (ref 3.5–5.1)
Sodium: 137 mmol/L (ref 135–145)
TOTAL PROTEIN: 6.6 g/dL (ref 6.5–8.1)

## 2016-11-20 LAB — TYPE AND SCREEN
ABO/RH(D): O POS
ANTIBODY SCREEN: NEGATIVE

## 2016-11-20 LAB — PROTEIN / CREATININE RATIO, URINE
CREATININE, URINE: 31 mg/dL
PROTEIN CREATININE RATIO: 0.35 mg/mg{creat} — AB (ref 0.00–0.15)
TOTAL PROTEIN, URINE: 11 mg/dL

## 2016-11-20 LAB — CHLAMYDIA/NGC RT PCR (ARMC ONLY)
Chlamydia Tr: NOT DETECTED
N gonorrhoeae: NOT DETECTED

## 2016-11-20 LAB — CBC
HEMATOCRIT: 37.7 % (ref 35.0–47.0)
Hemoglobin: 13.3 g/dL (ref 12.0–16.0)
MCH: 30.6 pg (ref 26.0–34.0)
MCHC: 35.2 g/dL (ref 32.0–36.0)
MCV: 87 fL (ref 80.0–100.0)
Platelets: 184 10*3/uL (ref 150–440)
RBC: 4.33 MIL/uL (ref 3.80–5.20)
RDW: 14.4 % (ref 11.5–14.5)
WBC: 10.8 10*3/uL (ref 3.6–11.0)

## 2016-11-20 LAB — RAPID HIV SCREEN (HIV 1/2 AB+AG)
HIV 1/2 ANTIBODIES: NONREACTIVE
HIV-1 P24 Antigen - HIV24: NONREACTIVE

## 2016-11-20 MED ORDER — DIPHENHYDRAMINE HCL 50 MG/ML IJ SOLN: 12.5000 mg | INTRAMUSCULAR | Status: DC | PRN

## 2016-11-20 MED ORDER — HYDRALAZINE HCL 20 MG/ML IJ SOLN
10.0000 mg | Freq: Once | INTRAMUSCULAR | Status: DC | PRN
  Filled 2016-11-20: qty 1

## 2016-11-20 MED ORDER — SODIUM CHLORIDE 0.9% FLUSH: 3.0000 mL | INTRAVENOUS | Status: DC | PRN

## 2016-11-20 MED ORDER — ONDANSETRON HCL 4 MG/2ML IJ SOLN: 4.0000 mg | Freq: Four times a day (QID) | INTRAMUSCULAR | Status: DC | PRN

## 2016-11-20 MED ORDER — SODIUM CHLORIDE 0.9 % IV SOLN
1.0000 g | INTRAVENOUS | Status: DC
  Administered 2016-11-20 (×2): 1 g via INTRAVENOUS
  Filled 2016-11-20 (×2): qty 1000

## 2016-11-20 MED ORDER — OXYTOCIN BOLUS FROM INFUSION
500.0000 mL | Freq: Once | INTRAVENOUS | Status: AC
  Administered 2016-11-20: 500 mL via INTRAVENOUS

## 2016-11-20 MED ORDER — NALBUPHINE HCL 10 MG/ML IJ SOLN: 5.0000 mg | INTRAMUSCULAR | Status: DC | PRN

## 2016-11-20 MED ORDER — LACTATED RINGERS IV SOLN
500.0000 mL | INTRAVENOUS | Status: DC | PRN
  Administered 2016-11-20 (×2): 500 mL via INTRAVENOUS

## 2016-11-20 MED ORDER — NALBUPHINE HCL 10 MG/ML IJ SOLN: 5.0000 mg | Freq: Once | INTRAMUSCULAR | Status: DC | PRN

## 2016-11-20 MED ORDER — FENTANYL 2.5 MCG/ML W/ROPIVACAINE 0.2% IN NS 100 ML EPIDURAL INFUSION (ARMC-ANES)
EPIDURAL | Status: DC | PRN
  Administered 2016-11-20: 10 mL/h via EPIDURAL

## 2016-11-20 MED ORDER — LIDOCAINE-EPINEPHRINE (PF) 1.5 %-1:200000 IJ SOLN
INTRAMUSCULAR | Status: DC | PRN
  Administered 2016-11-20: 3 mL via PERINEURAL

## 2016-11-20 MED ORDER — LIDOCAINE HCL (PF) 1 % IJ SOLN
30.0000 mL | INTRAMUSCULAR | Status: DC | PRN
  Administered 2016-11-20: 30 mL via SUBCUTANEOUS

## 2016-11-20 MED ORDER — DIPHENHYDRAMINE HCL 25 MG PO CAPS: 25.0000 mg | ORAL_CAPSULE | ORAL | Status: DC | PRN

## 2016-11-20 MED ORDER — FENTANYL 2.5 MCG/ML W/ROPIVACAINE 0.2% IN NS 100 ML EPIDURAL INFUSION (ARMC-ANES)
EPIDURAL | Status: AC
Start: 2016-11-20 — End: 2016-11-20
  Filled 2016-11-20: qty 100

## 2016-11-20 MED ORDER — BUPIVACAINE HCL (PF) 0.25 % IJ SOLN
INTRAMUSCULAR | Status: DC | PRN
  Administered 2016-11-20 (×2): 4 mL via EPIDURAL

## 2016-11-20 MED ORDER — TERBUTALINE SULFATE 1 MG/ML IJ SOLN: 0.2500 mg | Freq: Once | INTRAMUSCULAR | Status: DC | PRN

## 2016-11-20 MED ORDER — FENTANYL CITRATE (PF) 100 MCG/2ML IJ SOLN
INTRAMUSCULAR | Status: AC
  Administered 2016-11-20: 50 ug
  Filled 2016-11-20: qty 2

## 2016-11-20 MED ORDER — BUTORPHANOL TARTRATE 1 MG/ML IJ SOLN
2.0000 mg | INTRAMUSCULAR | Status: DC | PRN
  Filled 2016-11-20: qty 2

## 2016-11-20 MED ORDER — AMMONIA AROMATIC IN INHA
RESPIRATORY_TRACT | Status: AC
  Filled 2016-11-20: qty 10

## 2016-11-20 MED ORDER — LABETALOL HCL 5 MG/ML IV SOLN
20.0000 mg | INTRAVENOUS | Status: DC | PRN
  Filled 2016-11-20: qty 16

## 2016-11-20 MED ORDER — SODIUM CHLORIDE 0.9 % IV SOLN
2.0000 g | Freq: Once | INTRAVENOUS | Status: AC
  Administered 2016-11-20: 2 g via INTRAVENOUS
  Filled 2016-11-20: qty 2000

## 2016-11-20 MED ORDER — LIDOCAINE HCL (PF) 1 % IJ SOLN
INTRAMUSCULAR | Status: DC
Start: 2016-11-20 — End: 2016-11-22
  Administered 2016-11-20: 30 mL via SUBCUTANEOUS
  Filled 2016-11-20: qty 30

## 2016-11-20 MED ORDER — PSYLLIUM 95 % PO PACK
1.0000 | PACK | Freq: Every day | ORAL | Status: DC
  Administered 2016-11-22: 1 via ORAL
  Filled 2016-11-20 (×3): qty 1

## 2016-11-20 MED ORDER — ACETAMINOPHEN 325 MG PO TABS
650.0000 mg | ORAL_TABLET | ORAL | Status: DC | PRN
  Administered 2016-11-20: 650 mg via ORAL
  Filled 2016-11-20: qty 2

## 2016-11-20 MED ORDER — FENTANYL CITRATE (PF) 100 MCG/2ML IJ SOLN
50.0000 ug | Freq: Once | INTRAMUSCULAR | Status: AC
  Administered 2016-11-20: 50 ug via INTRAVENOUS

## 2016-11-20 MED ORDER — NALOXONE HCL 0.4 MG/ML IJ SOLN: 0.4000 mg | INTRAMUSCULAR | Status: DC | PRN

## 2016-11-20 MED ORDER — OXYTOCIN 10 UNIT/ML IJ SOLN
INTRAMUSCULAR | Status: AC
  Filled 2016-11-20: qty 2

## 2016-11-20 MED ORDER — OXYTOCIN 40 UNITS IN LACTATED RINGERS INFUSION - SIMPLE MED
1.0000 m[IU]/min | INTRAVENOUS | Status: DC
  Administered 2016-11-20: 1 m[IU]/min via INTRAVENOUS
  Filled 2016-11-20: qty 1000

## 2016-11-20 MED ORDER — LIDOCAINE HCL (PF) 1 % IJ SOLN
INTRAMUSCULAR | Status: DC | PRN
  Administered 2016-11-20: 3 mL via INTRADERMAL

## 2016-11-20 MED ORDER — SOD CITRATE-CITRIC ACID 500-334 MG/5ML PO SOLN
30.0000 mL | ORAL | Status: DC | PRN
  Administered 2016-11-20: 30 mL via ORAL
  Filled 2016-11-20: qty 15
  Filled 2016-11-20: qty 30

## 2016-11-20 MED ORDER — LIDOCAINE HCL (PF) 1 % IJ SOLN
INTRAMUSCULAR | Status: AC
  Filled 2016-11-20: qty 30

## 2016-11-20 MED ORDER — FENTANYL 2.5 MCG/ML W/ROPIVACAINE 0.2% IN NS 100 ML EPIDURAL INFUSION (ARMC-ANES)
10.0000 mL/h | EPIDURAL | Status: DC
  Administered 2016-11-20: 10 mL/h via EPIDURAL

## 2016-11-20 MED ORDER — MISOPROSTOL 200 MCG PO TABS
ORAL_TABLET | ORAL | Status: AC
  Filled 2016-11-20: qty 4

## 2016-11-20 MED ORDER — LACTATED RINGERS IV SOLN
INTRAVENOUS | Status: DC
  Administered 2016-11-20 (×2): via INTRAVENOUS
  Administered 2016-11-21: 125 mL/h via INTRAVENOUS

## 2016-11-20 MED ORDER — DOCUSATE SODIUM 100 MG PO CAPS
100.0000 mg | ORAL_CAPSULE | Freq: Two times a day (BID) | ORAL | Status: DC
  Administered 2016-11-21 – 2016-11-22 (×3): 100 mg via ORAL
  Filled 2016-11-20 (×3): qty 1

## 2016-11-20 MED ORDER — OXYTOCIN 40 UNITS IN LACTATED RINGERS INFUSION - SIMPLE MED
2.5000 [IU]/h | INTRAVENOUS | Status: DC
  Administered 2016-11-21: 2.5 [IU]/h via INTRAVENOUS
  Filled 2016-11-20: qty 1000

## 2016-11-20 MED ORDER — DEXTROSE 5 % IV SOLN
1.0000 ug/kg/h | INTRAVENOUS | Status: DC | PRN
  Filled 2016-11-20: qty 2

## 2016-11-20 NOTE — Progress Notes (Signed)
Cheryl Wong is a 21 y.o. G1P0 at 2631w5d by dating & admitted for SROM at 0630am    Subjective: Hurting with UC and wants to do natural labor  Objective: BP 114/78   Pulse 97   Temp 98.4 F (36.9 C) (Oral)   Ht 5\' 2"  (1.575 m)   Wt 72.1 kg (159 lb)   BMI 29.08 kg/m  No intake/output data recorded. Total I/O In: 63.8 [I.V.:13.8; IV Piggyback:50] Out: -   FHT: 140, Cat 1, No decels, +accels UC:  q 4 mins, 50 mm, IUPC inserted and working well SVE:   Dilation: 6 Effacement (%): 100 Station: 0 Exam by:: Cheryl Wong CNM  Labs: Lab Results  Component Value Date   WBC 10.8 11/20/2016   HGB 13.3 11/20/2016   HCT 37.7 11/20/2016   MCV 87.0 11/20/2016   PLT 184 11/20/2016    Assessment / Plan: A: 1. IUP at term 2. GBS unknown P: 1. IUPC inserted. 2. Pitocin at 11 mu/min 3. Antic SVD.  Sharee PimpleCaron W Wong 11/20/2016, 3:41 PM

## 2016-11-20 NOTE — Progress Notes (Signed)
Cheryl Wong is a 21 y.o. G1P0 at 3757w5d by dates. Pt has been pushing effectively x 1h45 mins and epidural was found on the floor so not working. Pt is pushing very well.   Subjective: "I hurt with pushing" Objective: BP 114/73   Pulse 96   Temp 98.1 F (36.7 C) (Oral)   Resp 18   Ht 5\' 2"  (1.575 m)   Wt 72.1 kg (159 lb)   BMI 29.08 kg/m  I/O last 3 completed shifts: In: 1350.3 [I.V.:1300.3; IV Piggyback:50] Out: -  Total I/O In: 135.4 [I.V.:135.4] Out: -   FHT: 150, Cat 2, decels to 100 x 30-40 secs, Tol pushing well UC:  q 2 mins,  SVE:   Dilation: 10 Effacement (%): 100 Station: +1 [facility]:: [facility]  Labs: Lab Results  Component Value Date   WBC 10.8 11/20/2016   HGB 13.3 11/20/2016   HCT 37.7 11/20/2016   MCV 87.0 11/20/2016   PLT 184 11/20/2016    Assessment / Plan: A: IUP at term  2. Will re-evaluate at 2 hours of pushing. 3. May consider vacuum if pt exhausted. P: Re-evaluate in 10 mins   Cheryl Wong 11/20/2016, 10:01 PM

## 2016-11-20 NOTE — Discharge Summary (Addendum)
Obstetric Discharge Summary Reason for Admission: SROM at 0630am on 11/20/16 with augmentation of labor at term Prenatal Procedures: US Intrapartum Procedures:Antibiotics for GBS, IUPC, ILF [place] forcep delivery persistent LOT position time of delivery 11/20/16 at 2243 Postpartum Procedures: Foley to BSD Complications-Operative and Postpartum: 3rd degree lac with repair Hemoglobin  Date Value Ref Range Status  11/21/2016 9.6 (L) 12.0 - 16.0 g/dL Final   HCT  Date Value Ref Range Status  11/21/2016 28.5 (L) 35.0 - 47.0 % Final    Physical Exam:  General:A,A&O x 3 Heart: S1S2, RRR, No M/R/G Lungs: CTAB, no W/R/R. Lochia: mod, no clots Uterine Fundus: firm Incision: Incision intact, labia edematous but not inhibiting voiding, no erythema, sutures intact DVT Evaluation:neg   Discharge Diagnoses: Term forceps delivery with 3rd deg lac with repair  Discharge Information:  Date: 11/22/16 Activity: pelvic rest Diet: routine Medications: PNV, Colace and Iron and Ibuprofen Condition: stable Instructions: refer to practice specific booklet and no driving x 2 weeks, no sex x 6 weeks, FU at [facility] at 6 weeks for birth control options and check up Discharge to: home   Newborn Data: Live born female  Birth Weight: 7 lb 10.1 oz (3460 g) APGAR: 2, 8 ABG:  pH cord blood (arterial) 7.210 - 7.380 7.34   pCO2 cord blood (arterial) 42.0 - 56.0 mmHg 40.0    Bicarbonate 13.0 - 22.0 mmol/L 21.6      Home with mother.  Christeen DouglasBEASLEY, Cheryl Wartman 11/22/2016, 11:24 AM

## 2016-11-20 NOTE — Progress Notes (Signed)
Aparna A Cena is a 21 y.o. G1P0 at 7465w5d by dating and in active labor. Subjective: Hurts with UC  Objective: BP 117/74   Pulse 97   Temp 98.2 F (36.8 C) (Oral)   Ht 5\' 2"  (1.575 m)   Wt 72.1 kg (159 lb)   BMI 29.08 kg/m  No intake/output data recorded. No intake/output data recorded.  FHT: 130, Cat 1, no decels, +accels UC: q 4-7 mins SVE:   Dilation: 3 Effacement (%): 100 Exam by:: Yetta Barre. Tian Mcmurtrey CNM  Labs: Lab Results  Component Value Date   WBC 10.8 11/20/2016   HGB 13.3 11/20/2016   HCT 37.7 11/20/2016   MCV 87.0 11/20/2016   PLT 184 11/20/2016    Assessment / Plan: A: IUP at term 2. GBS unknown 3. Pitocin augmentation 4. Protein/creat ratio is 350 P: Continue to monitor BP 2. No S/S of HA, RUQ pain or visual changes 3. Continue to monitor UC/FHT's.  Sharee Pimplearon W Deema Juncaj 11/20/2016, 12:15 PM

## 2016-11-20 NOTE — OB Triage Note (Signed)
Pt arrived to obs rm 4 with c/o SROM at 0630 this morning. Nitrazine done and result positive and positive visual assessment. Pt transferred to LDR 3 for admission.

## 2016-11-20 NOTE — Anesthesia Procedure Notes (Signed)
Epidural Patient location during procedure: OB Start time: 11/20/2016 4:20 PM  Staffing Resident/CRNA: Stormy FabianURTIS, LINDA Performed: resident/CRNA   Preanesthetic Checklist Completed: patient identified, site marked, surgical consent, pre-op evaluation, timeout performed, IV checked, risks and benefits discussed and monitors and equipment checked  Epidural Patient position: sitting Prep: Betadine Patient monitoring: heart rate, continuous pulse ox and blood pressure Approach: midline Location: L3-L4 Injection technique: LOR saline  Needle:  Needle type: Tuohy  Needle gauge: 17 G Needle length: 9 cm and 9 Catheter type: closed end flexible Catheter size: 20 Guage Catheter at skin depth: 11 cm Test dose: negative and 1.5% lidocaine with Epi 1:200 K  Assessment Sensory level: T10 Events: blood not aspirated, injection not painful, no injection resistance, negative IV test and no paresthesia  Additional Notes   Patient tolerated the insertion well without complications.Reason for block:procedure for pain

## 2016-11-20 NOTE — Progress Notes (Signed)
Nakeya A Ibrahim is a 21 y.o. G1P0 at 2444w5d by dating in active labor. Pt is shaking all over from transition.   Subjective: "I feel shaky all over"  Objective: BP 114/73   Pulse 96   Temp 98.1 F (36.7 C) (Oral)   Resp 18   Ht 5\' 2"  (1.575 m)   Wt 72.1 kg (159 lb)   BMI 29.08 kg/m  I/O last 3 completed shifts: In: 63.8 [I.V.:13.8; IV Piggyback:50] Out: -  No intake/output data recorded.  FHT:  UC:    SVE:   Dilation: 8.5 Effacement (%): 100 Station: +1 Exam by:: [facility] RN  Labs: Lab Results  Component Value Date   WBC 10.8 11/20/2016   HGB 13.3 11/20/2016   HCT 37.7 11/20/2016   MCV 87.0 11/20/2016   PLT 184 11/20/2016    Assessment / Plan: A:1. IUP at term 2. GBS unknown with Antibiotics per protocol P; Pitocin per protocol for active labor pattern 2. Antic SVD. 3. Dr Feliberto GottronSchermerhorn updated with the plan.  Sharee PimpleCaron W Monie Shere 11/20/2016, 7:54 PM

## 2016-11-20 NOTE — H&P (Signed)
Cheryl Wong is a 21 y.o. female presenting for SROM of clear fluid at 0630am today. [facility] significant for LMP of 36/1617 & EDDD of 12/06/16 c/w US at 19 3/7 weeks with EDD of 12/06/16. Pt has dishydrotic eczema, psoriasis, Gallbladder pain in past . Pt is from [place] and formerly from [place], did not complete [employer].  OB History    Gravida Para Term Preterm AB Living   1             SAB TAB Ectopic Multiple Live Births                 Past Medical History:  Diagnosis Date  . Acute cholecystitis 10/04/2016  . Asthma   . Biliary colic   . Dyshidrotic eczema   . Psoriasis   . RUQ pain    History reviewed. No pertinent surgical history. Family History: family history includes Diabetes in her mother; Hypertension in her father. Social History:  reports that she has never smoked. She has never used smokeless tobacco. She reports that she does not drink alcohol or use drugs.     Maternal Diabetes: passed 3 h GCT  Genetic Screening: not found in chart Maternal Ultrasounds/Referrals: No report but, had anatomy scan  Fetal Ultrasounds or other Referrals:  None Maternal Substance Abuse:  No Significant Maternal Medications:  None Significant Maternal Lab Results:  None Other Comments:  None  Review of Systems  Constitutional: Negative.   HENT: Negative.   Eyes: Negative.   Respiratory: Negative.   Cardiovascular: Negative.   Gastrointestinal: Negative.   Genitourinary: Negative.   Musculoskeletal: Negative.   Skin: Negative.   Neurological: Negative.   Endo/Heme/Allergies: Negative.   Psychiatric/Behavioral: Negative.    History Dilation: 3 Effacement (%): 100 Exam by:: C. Jones CNM Blood pressure (!) 137/91, pulse 90, temperature 98.5 F (36.9 C). Exam Physical Exam  Gen: 21 yo female in NAD, speaks some English and sister in law is interpreting HEENT: Eyes non-icteric, normocephalic. Heart: S1S2, RRR, NO M/R/G. Lungs: CTA bilat, no W/R?R. Abd: Gravid: EFW  8#0oz FHT: 140, Cat 1, +accels, no decels (maternal movement) UC: q 2-3 mins Extrems: 1+/0 Prenatal labs: ABO, Rh:    O pos,  Antibody:  Neg Rubella:  Immune RPR:   NR HBsAg:   Neg HIV:   Neg GBS:   not found  Assessment/Plan: A. 1. IUP at 38 5/7 weeks 2. GBS unknown P: 1. Admit to Birthplace for delivery. 2. GBS coverage if GBS not found and neg 3. Pt wants to see if she needs meds or epidural. 4 Monitor VS, FHR and UC's. 5. Antic SVD    [name] 11/20/2016, 8:26 AM

## 2016-11-20 NOTE — Progress Notes (Signed)
Patient ID: Cheryl Wong, female   DOB: [birth year], 21 y.o.   MRN: [id number]   Late entry: At 2215, pt was checked and baby was still at a +3 station. Pt exhausted and offered 3 options: 1. Consider vacuum (advised pt that I was not sure that baby would come in the transvere position) 2. Call Dr Feliberto GottronSchermerhorn and discss forceps option. 3. Do a LTCS. The pt and her mother in law asked to have Dr Feliberto GottronSchermerhorn called so that he could assist the baby as soon as poss. FHR went to 170 till 2 mins before delivery when the FHR went to 80's due to forceps application. See Dr Francesca OmanSchermerhorn's note for delivery.

## 2016-11-20 NOTE — Progress Notes (Signed)
Leonor A Manchester is a 21 y.o. G1P0 at 4728w5d by accurate LMP here for SROM at 0630am.  Subjective: UC's spacing out  Objective: BP 124/79   Pulse (!) 102   Temp 98.2 F (36.8 C) (Oral)   Ht 5\' 2"  (1.575 m)   Wt 72.1 kg (159 lb)   BMI 29.08 kg/m  No intake/output data recorded. No intake/output data recorded.  FHT: 140, +accels, no decels, Cat 1 UC:  q 7 mins and mild SVE:   Dilation: 3 Effacement (%): 100 Exam by:: Yetta Barre. Jones CNM  Labs: Lab Results  Component Value Date   WBC 10.8 11/20/2016   HGB 13.3 11/20/2016   HCT 37.7 11/20/2016   MCV 87.0 11/20/2016   PLT 184 11/20/2016    Assessment / Plan: A: IUP at 38 5/7 weeks 2. GBs unknown 3. 1 elevated BP  P: PIH panel ordered 2. GBS unknown:Amp per protocol 3. Start Pitocin per protocol to augment labor 4. Dr Feliberto GottronSchermerhorn aware and agrees with the plan Sharee PimpleCaron W Jones 11/20/2016, 10:50 AM

## 2016-11-20 NOTE — Anesthesia Preprocedure Evaluation (Signed)
Anesthesia Evaluation  Patient identified by MRN, date of birth, ID band Patient awake    Reviewed: Allergy & Precautions, H&P , NPO status , Patient's Chart, lab work & pertinent test results  Airway Mallampati: II  TM Distance: <3 FB Neck ROM: full    Dental no notable dental hx.    Pulmonary neg pulmonary ROS,    Pulmonary exam normal        Cardiovascular negative cardio ROS Normal cardiovascular exam     Neuro/Psych negative neurological ROS  negative psych ROS   GI/Hepatic negative GI ROS, Neg liver ROS,   Endo/Other  negative endocrine ROS  Renal/GU negative Renal ROS  negative genitourinary   Musculoskeletal   Abdominal   Peds  Hematology negative hematology ROS (+)   Anesthesia Other Findings   Reproductive/Obstetrics (+) Pregnancy                             Anesthesia Physical Anesthesia Plan  ASA: II  Anesthesia Plan: Epidural   Post-op Pain Management:    Induction:   Airway Management Planned:   Additional Equipment:   Intra-op Plan:   Post-operative Plan:   Informed Consent: I have reviewed the patients History and Physical, chart, labs and discussed the procedure including the risks, benefits and alternatives for the proposed anesthesia with the patient or authorized representative who has indicated his/her understanding and acceptance.     Plan Discussed with: CRNA and Anesthesiologist  Anesthesia Plan Comments:         Anesthesia Quick Evaluation

## 2016-11-20 NOTE — Progress Notes (Signed)
Cheryl Wong is a 21 y.o. G1P0 at 841w5d by dating C/W US. Pt has been pushing x 1 hour and is bringing the baby down but, the position if between ROP and ROT making it challenging to get the head lower than +2 station. Will labor on Rt side and then Lt side to see if position affects the pushing process  Subjective: "I am hurting" Objective: BP 114/73   Pulse 96   Temp 98.1 F (36.7 C) (Oral)   Resp 18   Ht 5\' 2"  (1.575 m)   Wt 72.1 kg (159 lb)   BMI 29.08 kg/m  I/O last 3 completed shifts: In: 1350.3 [I.V.:1300.3; IV Piggyback:50] Out: -  Total I/O In: 135.4 [I.V.:135.4] Out: -   FHT: 140, +decel with pushing, Cat 2 UC: q 2 mins SVE:   Dilation: 10 Effacement (%): 100 Station: +1 Exam by:: [facility] RN  Labs: Lab Results  Component Value Date   WBC 10.8 11/20/2016   HGB 13.3 11/20/2016   HCT 37.7 11/20/2016   MCV 87.0 11/20/2016   PLT 184 11/20/2016    Assessment / Plan: A: IUP at term 2. C/C/vtx+2 P: 1. Enc position change to augment pushing process. 2. Dr Feliberto GottronSchermerhorn texted with progress. 3. Continue to monitor UC/FHT's and progress.   Labor: Progressing normally Preeclampsia:  no S/S  Fetal Wellbeing:  Category II Pain Control:  Epidural I/D:  n/a Anticipated MOD:  NSVD  [name] 11/20/2016, 9:20 PM

## 2016-11-20 NOTE — Progress Notes (Addendum)
Cheryl Wong is a 21 y.o. G1P0 at 2880w5d by dating and US. Subjective: "Hurting with UC's"  Objective: BP 117/74   Pulse 97   Temp 98.2 F (36.8 C) (Oral)   Ht 5\' 2"  (1.575 m)   Wt 72.1 kg (159 lb)   BMI 29.08 kg/m  No intake/output data recorded. No intake/output data recorded.  FHT:  140, Cat 1, no decels, +accels UC:   regular, every 2-3 minutes, mod-strong to palp SVE: 5/90%/vtx-1 Labs: Lab Results  Component Value Date   WBC 10.8 11/20/2016   HGB 13.3 11/20/2016   HCT 37.7 11/20/2016   MCV 87.0 11/20/2016   PLT 184 11/20/2016    Assessment / Plan: A: IUP at term 2. GBS unknown P: Antic SVD. 2. Pitocin at 9 mun/min 3. Continue to monitor uC/FHT's 4. Report to Dr Feliberto GottronSchermerhorn. Will consider IUPC at 1700 if no further progress.    Cheryl Wong 11/20/2016, 2:03 PM

## 2016-11-20 NOTE — Anesthesia Procedure Notes (Signed)
Epidural

## 2016-11-21 DIAGNOSIS — D62 Acute posthemorrhagic anemia: Secondary | ICD-10-CM | POA: Diagnosis not present

## 2016-11-21 LAB — CBC
HCT: 28.5 % — ABNORMAL LOW (ref 35.0–47.0)
Hemoglobin: 9.6 g/dL — ABNORMAL LOW (ref 12.0–16.0)
MCH: 30 pg (ref 26.0–34.0)
MCHC: 33.8 g/dL (ref 32.0–36.0)
MCV: 88.9 fL (ref 80.0–100.0)
PLATELETS: 162 10*3/uL (ref 150–440)
RBC: 3.2 MIL/uL — AB (ref 3.80–5.20)
RDW: 14.2 % (ref 11.5–14.5)
WBC: 16.2 10*3/uL — AB (ref 3.6–11.0)

## 2016-11-21 LAB — RPR: RPR: NONREACTIVE

## 2016-11-21 MED ORDER — ACETAMINOPHEN 325 MG PO TABS
650.0000 mg | ORAL_TABLET | ORAL | Status: DC | PRN
  Administered 2016-11-21: 650 mg via ORAL
  Filled 2016-11-21: qty 2

## 2016-11-21 MED ORDER — TETANUS-DIPHTH-ACELL PERTUSSIS 5-2.5-18.5 LF-MCG/0.5 IM SUSP: 0.5000 mL | Freq: Once | INTRAMUSCULAR | Status: DC

## 2016-11-21 MED ORDER — SODIUM CHLORIDE 0.9% FLUSH: 3.0000 mL | Freq: Two times a day (BID) | INTRAVENOUS | Status: DC

## 2016-11-21 MED ORDER — PRENATAL MULTIVITAMIN CH
1.0000 | ORAL_TABLET | Freq: Every day | ORAL | Status: DC
  Administered 2016-11-21 – 2016-11-22 (×2): 1 via ORAL
  Filled 2016-11-21 (×2): qty 1

## 2016-11-21 MED ORDER — SODIUM CHLORIDE 0.9 % IV SOLN: 250.0000 mL | INTRAVENOUS | Status: DC | PRN

## 2016-11-21 MED ORDER — FERROUS SULFATE 325 (65 FE) MG PO TABS
325.0000 mg | ORAL_TABLET | Freq: Every day | ORAL | Status: DC
  Administered 2016-11-21 – 2016-11-22 (×2): 325 mg via ORAL
  Filled 2016-11-21 (×2): qty 1

## 2016-11-21 MED ORDER — ZOLPIDEM TARTRATE 5 MG PO TABS: 5.0000 mg | ORAL_TABLET | Freq: Every evening | ORAL | Status: DC | PRN

## 2016-11-21 MED ORDER — ONDANSETRON HCL 4 MG/2ML IJ SOLN: 4.0000 mg | INTRAMUSCULAR | Status: DC | PRN

## 2016-11-21 MED ORDER — COCONUT OIL OIL: 1.0000 "application " | TOPICAL_OIL | Status: DC | PRN

## 2016-11-21 MED ORDER — SODIUM CHLORIDE 0.9% FLUSH: 3.0000 mL | INTRAVENOUS | Status: DC | PRN

## 2016-11-21 MED ORDER — BISACODYL 10 MG RE SUPP: 10.0000 mg | Freq: Every day | RECTAL | Status: DC | PRN

## 2016-11-21 MED ORDER — BENZOCAINE-MENTHOL 20-0.5 % EX AERO
1.0000 "application " | INHALATION_SPRAY | CUTANEOUS | Status: DC | PRN
  Administered 2016-11-21: 1 via TOPICAL
  Filled 2016-11-21: qty 56

## 2016-11-21 MED ORDER — FLEET ENEMA 7-19 GM/118ML RE ENEM: 1.0000 | ENEMA | Freq: Every day | RECTAL | Status: DC | PRN

## 2016-11-21 MED ORDER — DIBUCAINE 1 % RE OINT: 1.0000 "application " | TOPICAL_OINTMENT | RECTAL | Status: DC | PRN

## 2016-11-21 MED ORDER — WITCH HAZEL-GLYCERIN EX PADS: 1.0000 "application " | MEDICATED_PAD | CUTANEOUS | Status: DC | PRN

## 2016-11-21 MED ORDER — DIPHENHYDRAMINE HCL 25 MG PO CAPS: 25.0000 mg | ORAL_CAPSULE | Freq: Four times a day (QID) | ORAL | Status: DC | PRN

## 2016-11-21 MED ORDER — IBUPROFEN 600 MG PO TABS
600.0000 mg | ORAL_TABLET | Freq: Four times a day (QID) | ORAL | Status: DC
  Administered 2016-11-22 (×3): 600 mg via ORAL
  Filled 2016-11-21 (×3): qty 1

## 2016-11-21 MED ORDER — MEASLES, MUMPS & RUBELLA VAC ~~LOC~~ INJ: 0.5000 mL | INJECTION | Freq: Once | SUBCUTANEOUS | Status: DC

## 2016-11-21 MED ORDER — IBUPROFEN 600 MG PO TABS
600.0000 mg | ORAL_TABLET | Freq: Four times a day (QID) | ORAL | Status: DC
  Administered 2016-11-21 (×4): 600 mg via ORAL
  Filled 2016-11-21 (×4): qty 1

## 2016-11-21 MED ORDER — OXYCODONE HCL 5 MG PO TABS
5.0000 mg | ORAL_TABLET | ORAL | Status: DC | PRN
  Administered 2016-11-21 – 2016-11-22 (×5): 5 mg via ORAL
  Filled 2016-11-21 (×6): qty 1

## 2016-11-21 MED ORDER — ONDANSETRON HCL 4 MG PO TABS: 4.0000 mg | ORAL_TABLET | ORAL | Status: DC | PRN

## 2016-11-21 MED ORDER — SIMETHICONE 80 MG PO CHEW: 80.0000 mg | CHEWABLE_TABLET | ORAL | Status: DC | PRN

## 2016-11-21 NOTE — Progress Notes (Signed)
Patient ID: Cheryl Wong, female   DOB: 04-29-1996, 21 y.o.   MRN: [id number] Minimal forcep markings on fetal head ,. ABG:  pH cord blood (arterial) 7.210 - 7.380 7.34   pCO2 cord blood (arterial) 42.0 - 56.0 mmHg 40.0    Bicarbonate 13.0 - 22.0 mmol/L 21.6

## 2016-11-21 NOTE — Op Note (Deleted)
  The note originally documented on this encounter has been moved the the encounter in which it belongs.  

## 2016-11-21 NOTE — Op Note (Addendum)
[place]:  [place], [place]                ACCOUNT NO.:  [id number]  MEDICAL RECORD NO.:  [id number]  LOCATION:  346A                         FACILITY:  [facility]  PHYSICIAN:  Jennell Cornerhomas Charmika Macdonnell, MDDATE OF BIRTH:  03-31-1996  DATE OF PROCEDURE:  11/20/2016 DATE OF DISCHARGE:  11/22/2016                              OPERATIVE REPORT   TIME:  2243.  PREOPERATIVE DIAGNOSIS:  Persistent left occiput transverse to protracted second stage labor, 2-1/2 hours.  POSTOPERATIVE DIAGNOSIS: 1. Persistent left occiput transverse to protracted second stage     labor, 2-1/2 hours. 2. Third degree anal sphincter laceration.  PROCEDURE PERFORMED: 1. Indicated low forceps delivery with Kielland forceps with 90-degree     rotation and delivery of fetal head. 2. Repair of third degree rectal sphincter muscle.  SURGEON:  Jennell Cornerhomas Savien Mamula, MD  SURGEON:  Jennell Cornerhomas Catherene Kaleta, MD.  FIRST ASSISTANT:  Yetta BarreJones.  INDICATION:  This is a 21 year old gravida 1, para 0.  The patient was pushing for 2-1/2 hours affectively and was unable to bring the fetus down past +3/3 station.  Nurse midwife called me and examined the patient and ascertained fetal head was in the left occiput transverse position.  This was confirmed with a bedside ultrasound by me.  Foley had been previously removed 20 minutes prior to the procedure.  I spoke to the patient and the patient's mother-in-law about the role for a rotational forceps delivery with the risk involved of potential head injury, although unlikely.  The patient understood the indication and allowed me to proceed.  DESCRIPTION OF PROCEDURE:  The Pitocin was turned off to allow for a decrease uterine contractions during this time.  Kielland forceps were brought up to the operative field and the anterior blade was placed in the classic manner.  Once the anterior blade was placed, the posterior blade was placed and articulation of the forceps occurred without difficulty.  A  small midline episiotomy was performed and the fetal head was rotated 90 degrees in a counter-clockwise fashion.  Forceps remained on the fetal head and the heart rate did lower to the 80s and we had the patient push with the forceps on and the head was then delivered.  Prior to the head exiting the perineum, the blades were removed and then ultimately the head was delivered.  The loose nuchal cord was reduced and with the legs in the McRoberts position, left anterior shoulder was delivered followed by the posterior shoulder.  A floppy infant was placed on the mother's abdomen.  Nursery staff was in attendance.  The cord was doubly clamped and cut and nursery staff took the baby to the warmer and resuscitated the baby.  Apgar scores 2 and 8.  Fetal weight 7 pounds 10 ounces.  The placenta was then delivered approximately 10 minutes after the fetus was delivered intact.  Intravenous Pitocin was administered while fundal massage was performed and good uterine tone was noted.  There was a right posterior sulcus tear that was noted and a full length third degree tear through the anal sphincter muscle.  The tear was approximately 3 cm in width incorporating both the anterior and posterior aspect of the sphincter muscle.  The perineum,  sphincter muscles, and cutaneous areas were injected with 0.5% lidocaine.  A rectal exam was performed that demonstrated no defect in the rectal mucosa.  The right and left sphincter capsules were then grasped with Allis clamps and the capsule was then reapproximated with 3 separate figure-of-eight sutures with good approximation of tissue.  A repeat rectal exam demonstrated good anatomic repair with adequate tone of the sphincter.  The right posterior sulcus tear was then identified and was closed starting at the apex and a running locking 2-0 Vicryl suture was used to repair this defect.  A small defect in the left posterior sulcus area was noted which was also  repaired with 2-0 Vicryl suture.  A crown suture was then performed to reapproximate the perineal body, and the skin was reapproximated with a 3-0 Vicryl subcuticular suture.  A repeat rectal exam revealed normal repair, normal tone.  No defects.  Some blood clots were removed from the vaginal vault and a Foley catheter was placed.  Estimated blood loss was 800 mL.  Sponge and needle counts were correct.          ______________________________ Jennell Corner, MD     TS/MEDQ  D:  11/21/2016  T:  11/21/2016  Job:  [id number]

## 2016-11-21 NOTE — Anesthesia Postprocedure Evaluation (Signed)
Anesthesia Post Note  Patient: Cheryl Wong  Procedure(s) Performed: * No procedures listed *  Patient location during evaluation: Mother Baby Anesthesia Type: Epidural Level of consciousness: awake and awake and alert Pain management: pain level controlled Vital Signs Assessment: post-procedure vital signs reviewed and stable Respiratory status: spontaneous breathing, nonlabored ventilation and respiratory function stable Cardiovascular status: blood pressure returned to baseline and stable Postop Assessment: no headache and no backache Anesthetic complications: no     Last Vitals:  Vitals:   11/21/16 0234 11/21/16 0430  BP: 117/65 120/71  Pulse: (!) 103 100  Resp: 18 18  Temp: 37.2 C 36.9 C    Last Pain:  Vitals:   11/21/16 0430  TempSrc: Oral  PainSc: 10-Worst pain ever                 [employer] CorporationStephanie Nakshatra Klose

## 2016-11-21 NOTE — Progress Notes (Addendum)
Post Partum Day 1 Subjective: Doing well, no complaints.  Tolerating regular diet, pain with PO meds, has not yet ambulated.  Has foley placed due to significant labial swelling.  No CP SOB F/C N/V or leg pain   Objective: BP 129/82 (BP Location: Right Arm)   Pulse (!) 113 Comment: RN notified  Temp 98.6 F (37 C) (Oral)   Resp 19   Ht 5\' 2"  (1.575 m)   Wt 72.1 kg (159 lb)   SpO2 99%   Breastfeeding? Unknown   BMI 29.08 kg/m    Physical Exam:  General: NAD CV: RRR Pulm: nl effort, CTABL Lochia: moderate Uterine Fundus: fundus firm and below umbilicus Pelvic: significant swelling of the bilateral labia, no firm masses bilaterally or posteriorly in vagina DVT Evaluation: no cords, ttp LEs    Recent Labs  11/20/16 0919 11/21/16 0548  HGB 13.3 9.6*  HCT 37.7 28.5*  WBC 10.8 16.2*  PLT 184 162    Assessment/Plan: 21 y.o. G1P1001 postpartum day # 1  1. Post partum:  Continue routine cares, encourage oob 2. Labial swelling: maintain foley for 24hrs and reassess swelling 3. Acute blood loss Anemia: replete with ferrous sulfate 4. Anticipate discharge tomorrow    ----- Ranae Plumberhelsea Ward, MD Attending Obstetrician and Gynecologist [facility]

## 2016-11-22 MED ORDER — BENZOCAINE-MENTHOL 20-0.5 % EX AERO: 1.0000 "application " | INHALATION_SPRAY | CUTANEOUS | 0 refills | Status: DC | PRN

## 2016-11-22 MED ORDER — DOCUSATE SODIUM 100 MG PO CAPS
100.0000 mg | ORAL_CAPSULE | Freq: Two times a day (BID) | ORAL | 0 refills | Status: DC
Start: 2016-11-22 — End: 2020-07-03

## 2016-11-22 MED ORDER — WITCH HAZEL-GLYCERIN EX PADS: 1.0000 "application " | MEDICATED_PAD | CUTANEOUS | 12 refills | Status: DC | PRN

## 2016-11-22 MED ORDER — OXYCODONE HCL 5 MG PO TABS: 5.0000 mg | ORAL_TABLET | ORAL | 0 refills | Status: DC | PRN

## 2016-11-22 MED ORDER — IBUPROFEN 600 MG PO TABS: 600.0000 mg | ORAL_TABLET | Freq: Four times a day (QID) | ORAL | 0 refills | Status: DC

## 2016-11-22 MED ORDER — DIBUCAINE 1 % RE OINT: 1.0000 "application " | TOPICAL_OINTMENT | RECTAL | 0 refills | Status: DC | PRN

## 2016-11-22 NOTE — Discharge Instructions (Signed)
Care of a Perineal Tear °A perineal tear is a cut (laceration) in the tissue between the opening of the vagina and the anus (perineum). Some women naturally develop a perineal tear during a vaginal birth. This can happen as the baby emerges from the birth canal and the perineum is stretched. Perineal tears are graded based on how deep and long the laceration is. The grading for perineal tears is as follows: °· First degree. This involves a shallow tear at the edge of the vaginal opening that extends slightly into the perineal skin. °· Second degree. This involves tearing described in a first degree perineal tear and also a deeper tear of the vaginal opening and perineal tissues. It may also include tearing of a muscle just under the perineal skin. °· Third degree. This involves tearing described in a first and second degree perineal tear, with the tear extending into the muscle of the anus (anal sphincter). °· Fourth degree. This involves all levels of tear described for first, second, and third degree perineal tear, with the tear extending into the rectum. ° °First degree perineal tears may or may not be stitched closed, depending on their location and appearance. Second, third, and fourth degree perineal tears are stitched closed immediately after the baby’s birth. °What are the risks? °Depending on the type of perineal tear you have, you may be at risk for the following: °· Bleeding. °· Developing a collection of blood in the perineal tear area (hematoma). °· Pain. This may include pain with urination or bowel movements. °· Infection at the site of the tear. °· Fever. °· Trouble controlling your bowels (fecal incontinence). °· Painful sexual intercourse. ° °How to care for a perineal tear °· The first day, put ice on the area of the tear. °? Put ice in a plastic bag. °? Place a towel between your skin and the bag. °? Leave the ice on for 20 minutes, 2-3 times a day. °· Bathe using a warm sitz bath as directed by  your health care provider. This can speed up healing. Sitz baths can be performed in your bathtub or using a sitz bath kit that fits over your toilet. °? Place 3-4 in. (7.6-10 cm) of warm water in your bathtub or fill the sitz bath over-the-toilet container with warm water. Make sure the water is not too hot by placing a drop on your wrist. °? Sit in the warm water for 20-30 minutes. °? After bathing, pat your perineum dry with a clean towel. Do not scrub the perineum as this could cause pain, irritation, or open any stitches you may have. °? Keep the over-the-toilet sitz bath container clean by rinsing it thoroughly after each use. Ask for help in keeping the bathtub clean with diluted bleach and water (2 Tbsp [30 mL] of bleach to ½ gal [1.9 L] of water). °? Repeat the sitz bath as often as you would like to relieve perineal pain, itching, or discomfort. °· Apply a numbing spray to the perineal tear site as directed by your health care provider. This may help with discomfort. °· Wash your hands before and after applying medicine to the area. °· Put about 3 witch hazel-containing hemorrhoid treatment pads on top of your sanitary pad. The witch hazel in the hemorrhoid pads helps with discomfort and swelling. °· Get a squeeze bottle to squeeze warm water on your perineum when urinating, spraying the area from front to back. Pat the area to dry it. °· Sitting on an inflatable   ring or pillow may provide comfort. °· Take medicines only as directed by your health care provider. °· Do not have sexual intercourse or use tampons until your health care provider says it is okay. Typically, you must wait at least 6 weeks. °· Keep all postpartum appointments as directed by your health care provider. °Contact a health care provider if: °· Your pain is not relieved with medicines. °· You have painful urination. °· You have a fever. °Get help right away if: °· You have redness, swelling, or increasing pain in the area of the  tear. °· You have pus coming from the area of the tear. °· You notice a bad smell coming from the area of the tear. °· Your tear opens. °· You notice swelling in the area of the tear that is larger than when you left the hospital. °· You cannot urinate. °This information is not intended to replace advice given to you by your health care provider. Make sure you discuss any questions you have with your health care provider. °Document Released: 01/17/2014 Document Revised: 02/14/2016 Document Reviewed: 06/08/2013 °Elsevier Interactive Patient Education © 2017 [employer] ° °

## 2016-11-22 NOTE — Progress Notes (Signed)
Patient discharged home with infant and family Discharge instructions, prescriptions and follow up appointment given to and reviewed with patient and family. Patient verbalized understanding. [facility] [place].

## 2017-01-01 ENCOUNTER — Ambulatory Visit: Payer: Medicaid Other | Admitting: Surgery

## 2017-01-03 ENCOUNTER — Ambulatory Visit: Payer: Medicaid Other | Admitting: Surgery

## 2018-09-04 ENCOUNTER — Encounter: Payer: Self-pay | Admitting: Cardiovascular Disease

## 2018-09-19 ENCOUNTER — Other Ambulatory Visit: Payer: Self-pay

## 2018-09-19 ENCOUNTER — Ambulatory Visit: Payer: Self-pay | Admitting: Cardiovascular Disease

## 2018-09-19 ENCOUNTER — Encounter: Payer: Self-pay | Admitting: Cardiovascular Disease

## 2018-09-19 VITALS — BP 103/67 | HR 85 | Temp 98.6°F | Ht 62.0 in | Wt 137.2 lb

## 2018-09-19 DIAGNOSIS — L709 Acne, unspecified: Secondary | ICD-10-CM

## 2018-09-19 NOTE — Progress Notes (Signed)
Date:  09/19/2018   ID:  Cheryl Wong, DOB 04-Aug-1996, MRN [id number]  PCP:  [facility]   Chief Complaint  Patient presents with  . Acne    concern for skin (fsce, neck, back) - occurred during pregnancy, but came back after      History of Present Illness: Cheryl Wong is a 23 y.o. female who presents for evaluation of acne.  This started during pregnancy and has not improved since then.  The problem persisted for more than 2 years.  She reports being seen by a dermatologist and was given treatment but she does not know the name of the treating physician no other treatment.  She reported no improvement at that time.    Past Medical History:  Diagnosis Date  . Acute cholecystitis 10/04/2016  . Asthma   . Biliary colic   . Dyshidrotic eczema   . Psoriasis   . RUQ pain     No past surgical history on file.   Current Outpatient Medications  Medication Sig Dispense Refill  . azelaic acid (AZELEX) 20 % cream Apply topically.    . benzocaine-Menthol (DERMOPLAST) 20-0.5 % AERO Apply 1 application topically as needed for irritation (perineal discomfort). (Patient not taking: Reported on 09/19/2018) 56 g 0  . cetirizine (ZYRTEC) 10 MG tablet Take 10 mg by mouth.    . clindamycin (CLINDAGEL) 1 % gel Apply topically.    . dibucaine (NUPERCAINAL) 1 % OINT Place 1 application rectally as needed for hemorrhoids. (Patient not taking: Reported on 09/19/2018) 28 g 0  . docusate sodium (COLACE) 100 MG capsule Take 1 capsule (100 mg total) by mouth 2 (two) times daily. (Patient not taking: Reported on 09/19/2018) 10 capsule 0  . ibuprofen (ADVIL,MOTRIN) 600 MG tablet Take 1 tablet (600 mg total) by mouth every 6 (six) hours. (Patient not taking: Reported on 09/19/2018) 30 tablet 0  . oxyCODONE (OXY IR/ROXICODONE) 5 MG immediate release tablet Take 1 tablet (5 mg total) by mouth every 4 (four) hours as needed (pain scale 4-7). (Patient not taking: Reported on 09/19/2018) 30  tablet 0  . Prenatal Vit-Fe Fumarate-FA (MULTIVITAMIN-PRENATAL) 27-0.8 MG TABS tablet Take 1 tablet by mouth daily at 12 noon.    . ranitidine (ZANTAC) 150 MG tablet Take 150 mg by mouth 2 (two) times daily.    Marland Kitchen triamcinolone (KENALOG) 0.025 % cream Apply topically.    Marland Kitchen witch hazel-glycerin (TUCKS) pad Apply 1 application topically as needed for hemorrhoids. (Patient not taking: Reported on 09/19/2018) 40 each 12   No current facility-administered medications for this visit.     Allergies:   Patient has no known allergies.    Social History:  The patient  reports that she has never smoked. She has never used smokeless tobacco. She reports that she does not drink alcohol or use drugs.   Family History:  The patient's family history includes Diabetes in her mother; Hypertension in her father.    ROS:  Please see the history of present illness.   Otherwise, review of systems are positive for none.   All other systems are reviewed and negative.    PHYSICAL EXAM: VS:  BP 103/67 (BP Location: Left Arm, Patient Position: Sitting)   Pulse 85   Temp 98.6 F (37 C)   Ht 5\' 2"  (1.575 m)   Wt 137 lb 3.2 oz (62.2 kg)   SpO2 96%   BMI 25.09 kg/m  , BMI Body mass index is 25.09 kg/m.  EKG:  EKG is not ordered today.    Recent Labs: No results found for requested labs within last 8760 hours.    Lipid Panel No results found for: CHOL, TRIG, HDL, CHOLHDL, VLDL, LDLCALC, LDLDIRECT    Wt Readings from Last 3 Encounters:  09/19/18 137 lb 3.2 oz (62.2 kg)  11/20/16 159 lb (72.1 kg)  11/06/16 159 lb 9.6 oz (72.4 kg)        No flowsheet data found.    ASSESSMENT AND PLAN:  1.  Moderate to severe acne on the face and the back.  This has persisted since her pregnancy.  I referred the patient to dermatology.     Signed,  Lorine BearsMuhammad Arida, MD  09/19/2018 11:54 AM    [facility]

## 2019-11-01 ENCOUNTER — Other Ambulatory Visit: Payer: Self-pay

## 2019-11-01 ENCOUNTER — Ambulatory Visit: Payer: Medicaid Other

## 2019-11-03 ENCOUNTER — Other Ambulatory Visit: Payer: Self-pay

## 2019-11-03 ENCOUNTER — Encounter: Payer: Self-pay | Admitting: Advanced Practice Midwife

## 2019-11-03 ENCOUNTER — Ambulatory Visit (LOCAL_COMMUNITY_HEALTH_CENTER): Payer: Medicaid Other | Admitting: Advanced Practice Midwife

## 2019-11-03 VITALS — BP 105/64 | Ht 62.0 in | Wt 140.6 lb

## 2019-11-03 DIAGNOSIS — Z30011 Encounter for initial prescription of contraceptive pills: Secondary | ICD-10-CM

## 2019-11-03 DIAGNOSIS — Z3009 Encounter for other general counseling and advice on contraception: Secondary | ICD-10-CM

## 2019-11-03 DIAGNOSIS — Z3046 Encounter for surveillance of implantable subdermal contraceptive: Secondary | ICD-10-CM

## 2019-11-03 LAB — WET PREP FOR TRICH, YEAST, CLUE
Trichomonas Exam: NEGATIVE
Yeast Exam: NEGATIVE

## 2019-11-03 MED ORDER — NORGESTIM-ETH ESTRAD TRIPHASIC 0.18/0.215/0.25 MG-25 MCG PO TABS
1.0000 | ORAL_TABLET | Freq: Every day | ORAL | 2 refills | Status: DC
Start: 2019-11-03 — End: 2020-10-10

## 2019-11-03 NOTE — Progress Notes (Signed)
[facility]  Subjective:  Cheryl Wong is a 24 y.o. G1P1 nonsmoker being seen today for pap and Nexplanon removal  Chief Complaint  Patient presents with  . Procedure    Nexplanon removal  . Contraception    Pills    HPI Last sex 10/29/19 without condom.  LMP 10/30/19.  Last pap 2017 wnl.  Last physical 12/25/16.  Nexplanon inserted 12/27/16.  Does the patient have a current or past history of drug use? No   No components found for: HCV]   Health Maintenance Due  Topic Date Due  . TETANUS/TDAP  07/11/2013  . PAP-Cervical Cytology Screening  07/12/2015  . PAP SMEAR-Modifier  07/12/2015  . INFLUENZA VACCINE  04/17/2019    ROS  The following portions of the patient's history were reviewed and updated as appropriate: allergies, current medications, past family history, past medical history, past social history, past surgical history and problem list. Problem list updated.   See flowsheet for other program required questions.  Objective:   Vitals:   11/03/19 1320  BP: 105/64  Weight: 140 lb 9.6 oz (63.8 kg)  Height: 5\' 2"  (1.575 m)    Physical Exam Constitutional:      Appearance: Normal appearance.  Genitourinary:    General: Normal vulva.     Exam position: Lithotomy position.     Vagina: Vaginal discharge (light red menses) present.     Cervix: Normal.     Uterus: Normal.      Adnexa: Right adnexa normal and left adnexa normal.     Rectum: Normal.  Neurological:     Mental Status: She is alert.       Assessment and Plan:  Cheryl Wong is a 24 y.o. female presenting to the [facility] Department for a Women's Health problem visit  1. Family planning services Pap done.  Treat wet mount per standing orders. Immunization nurse consult - Chlamydia/Gonorrhea [facility] - WET PREP FOR TRICH, YEAST, CLUE - IGP, rfx Aptima HPV ASCU  2. Nexplanon removal Nexplanon  Removal Patient identified, informed consent performed, consent signed.   Appropriate time out taken. Nexplanon site identified.  Area prepped in usual sterile fashon. 3 ml of 1% lidocaine with Epinephrine was used to anesthetize the area at the distal end of the implant and along implant site. A small stab incision was made right beside the implant on the distal portion.  The Nexplanon rod was grasped using straight hemostats/manual and removed without difficulty.  There was minimal blood loss. There were no complications.  Steri-strips were applied over the small incision.  A pressure bandage was applied to reduce any bruising.  The patient tolerated the procedure well and was given post procedure instructions.   Nexplanon:   Counseled patient to take OTC analgesic starting as soon as lidocaine starts to wear off and take regularly for at least 48 hr to decrease discomfort.  Specifically to take with food or milk to decrease stomach upset and for IB 600 mg (3 tablets) every 6 hrs; IB 800 mg (4 tablets) every 8 hrs; or Aleve 2 tablets every 12 hrs.    3. Family planning   4. Encounter for initial prescription of contraceptive pills May have OTC #3 I po daily to begin today.  Please counsel on need for backup condoms/abstinance next 7 days     Return in about 3 months (around 01/31/2020).  No future appointments.  02/02/2020  A Jalysa Swopes, CNM

## 2019-11-03 NOTE — Progress Notes (Addendum)
Here today for Nexplanon removal. Declines interpreter. Nexplanon Insertion was here on 12/27/2016. Last PE was here 12/25/2016. No pap Smear records. Patient unsure as to if she has had a Pap Smear prior to today. Would like to discuss birth control pills as new birth control. Tawny Hopping, RN

## 2019-11-09 LAB — IGP, RFX APTIMA HPV ASCU: PAP Smear Comment: 0

## 2019-12-07 ENCOUNTER — Encounter: Payer: Self-pay | Admitting: Physician Assistant

## 2019-12-07 LAB — HM PAP SMEAR

## 2019-12-31 ENCOUNTER — Telehealth: Payer: Self-pay | Admitting: General Practice

## 2019-12-31 ENCOUNTER — Other Ambulatory Visit: Payer: Self-pay | Admitting: Physician Assistant

## 2019-12-31 DIAGNOSIS — Z3041 Encounter for surveillance of contraceptive pills: Secondary | ICD-10-CM

## 2019-12-31 MED ORDER — NORGESTIM-ETH ESTRAD TRIPHASIC 0.18/0.215/0.25 MG-35 MCG PO TABS: 1.0000 | ORAL_TABLET | Freq: Every day | ORAL | 7 refills | Status: DC

## 2019-12-31 NOTE — Telephone Encounter (Signed)
refill birth control ; [facility]

## 2019-12-31 NOTE — Progress Notes (Signed)
Patient called an left message that she would like refills of OCs sent to [facility] in [place].  Will sent Rx for refills to last until needs annual visit in 10/2020.  RN to call and let patient know Rx sent to pharmacy per her request.

## 2019-12-31 NOTE — Telephone Encounter (Signed)
Phone call to pt. Pt informed that Rx request was sent to her pharmacy as requested.

## 2020-05-03 ENCOUNTER — Other Ambulatory Visit: Payer: Self-pay | Admitting: Family Medicine

## 2020-05-03 DIAGNOSIS — R102 Pelvic and perineal pain: Secondary | ICD-10-CM

## 2020-05-10 ENCOUNTER — Other Ambulatory Visit: Payer: Self-pay

## 2020-05-10 ENCOUNTER — Ambulatory Visit
Admission: RE | Admit: 2020-05-10 | Discharge: 2020-05-10 | Disposition: A | Discharge status: Home / Self Care | Payer: Medicaid Other | Source: Ambulatory Visit | Attending: Family Medicine | Admitting: Family Medicine

## 2020-05-10 ENCOUNTER — Other Ambulatory Visit: Payer: Self-pay | Admitting: Family Medicine

## 2020-05-10 ENCOUNTER — Other Ambulatory Visit: Payer: Self-pay | Admitting: Physician Assistant

## 2020-05-10 DIAGNOSIS — R102 Pelvic and perineal pain: Secondary | ICD-10-CM | POA: Insufficient documentation

## 2020-05-10 DIAGNOSIS — Z3490 Encounter for supervision of normal pregnancy, unspecified, unspecified trimester: Secondary | ICD-10-CM | POA: Diagnosis present

## 2020-05-18 ENCOUNTER — Other Ambulatory Visit: Payer: Self-pay

## 2020-05-18 ENCOUNTER — Emergency Department: Payer: Medicaid Other

## 2020-05-18 ENCOUNTER — Encounter: Payer: Self-pay | Admitting: Emergency Medicine

## 2020-05-18 ENCOUNTER — Emergency Department
Admission: EM | Admit: 2020-05-18 | Discharge: 2020-05-18 | Disposition: A | Discharge status: Home / Self Care | Payer: Medicaid Other | Attending: Emergency Medicine | Admitting: Emergency Medicine

## 2020-05-18 DIAGNOSIS — Z79899 Other long term (current) drug therapy: Secondary | ICD-10-CM | POA: Diagnosis not present

## 2020-05-18 DIAGNOSIS — O23591 Infection of other part of genital tract in pregnancy, first trimester: Secondary | ICD-10-CM

## 2020-05-18 DIAGNOSIS — R102 Pelvic and perineal pain: Secondary | ICD-10-CM | POA: Insufficient documentation

## 2020-05-18 DIAGNOSIS — Z3A08 8 weeks gestation of pregnancy: Secondary | ICD-10-CM | POA: Insufficient documentation

## 2020-05-18 DIAGNOSIS — J45909 Unspecified asthma, uncomplicated: Secondary | ICD-10-CM | POA: Diagnosis not present

## 2020-05-18 LAB — COMPREHENSIVE METABOLIC PANEL
ALT: 45 U/L — ABNORMAL HIGH (ref 0–44)
AST: 28 U/L (ref 15–41)
Albumin: 4.1 g/dL (ref 3.5–5.0)
Alkaline Phosphatase: 53 U/L (ref 38–126)
Anion gap: 10 (ref 5–15)
BUN: <5 mg/dL — ABNORMAL LOW (ref 6–20)
CO2: 20 mmol/L — ABNORMAL LOW (ref 22–32)
Calcium: 9 mg/dL (ref 8.9–10.3)
Chloride: 105 mmol/L (ref 98–111)
Creatinine, Ser: 0.37 mg/dL — ABNORMAL LOW (ref 0.44–1.00)
GFR calc Af Amer: >60 mL/min (ref >60)
GFR calc non Af Amer: >60 mL/min (ref >60)
Glucose, Bld: 86 mg/dL (ref 70–99)
Potassium: 3.8 mmol/L (ref 3.5–5.1)
Sodium: 135 mmol/L (ref 135–145)
Total Bilirubin: 0.8 mg/dL (ref 0.3–1.2)
Total Protein: 7.8 g/dL (ref 6.5–8.1)

## 2020-05-18 LAB — CBC
HCT: 39.3 % (ref 36.0–46.0)
Hemoglobin: 13.7 g/dL (ref 12.0–15.0)
MCH: 30.5 pg (ref 26.0–34.0)
MCHC: 34.9 g/dL (ref 30.0–36.0)
MCV: 87.5 fL (ref 80.0–100.0)
Platelets: 226 10*3/uL (ref 150–400)
RBC: 4.49 MIL/uL (ref 3.87–5.11)
RDW: 12.7 % (ref 11.5–15.5)
WBC: 8.1 10*3/uL (ref 4.0–10.5)
nRBC: 0 % (ref 0.0–0.2)

## 2020-05-18 LAB — URINALYSIS, COMPLETE (UACMP) WITH MICROSCOPIC
Bacteria, UA: NONE SEEN
Bilirubin Urine: NEGATIVE
Glucose, UA: NEGATIVE mg/dL
Hgb urine dipstick: NEGATIVE
Ketones, ur: NEGATIVE mg/dL
Nitrite: NEGATIVE
Protein, ur: NEGATIVE mg/dL
Specific Gravity, Urine: 1.01 (ref 1.005–1.030)
pH: 7 (ref 5.0–8.0)

## 2020-05-18 LAB — WET PREP, GENITAL
Clue Cells Wet Prep HPF POC: NONE SEEN
Sperm: NONE SEEN
Trich, Wet Prep: NONE SEEN

## 2020-05-18 LAB — LIPASE, BLOOD: Lipase: 28 U/L (ref 11–51)

## 2020-05-18 LAB — HCG, QUANTITATIVE, PREGNANCY: hCG, Beta Chain, Quant, S: 259541 m[IU]/mL — ABNORMAL HIGH (ref <5)

## 2020-05-18 LAB — CHLAMYDIA/NGC RT PCR (ARMC ONLY)
Chlamydia Tr: NOT DETECTED
N gonorrhoeae: NOT DETECTED

## 2020-05-18 MED ORDER — ACETAMINOPHEN 325 MG PO TABS
650.0000 mg | ORAL_TABLET | Freq: Once | ORAL | Status: AC
  Administered 2020-05-18: 650 mg via ORAL
  Filled 2020-05-18: qty 2

## 2020-05-18 MED ORDER — AZITHROMYCIN 500 MG PO TABS
1000.0000 mg | ORAL_TABLET | Freq: Once | ORAL | Status: AC
  Administered 2020-05-18: 1000 mg via ORAL
  Filled 2020-05-18: qty 2

## 2020-05-18 MED ORDER — METOCLOPRAMIDE HCL 10 MG PO TABS
10.0000 mg | ORAL_TABLET | Freq: Once | ORAL | Status: AC
  Administered 2020-05-18: 10 mg via ORAL
  Filled 2020-05-18: qty 1

## 2020-05-18 MED ORDER — CEFTRIAXONE SODIUM 1 G IJ SOLR
500.0000 mg | Freq: Once | INTRAMUSCULAR | Status: AC
  Administered 2020-05-18: 500 mg via INTRAMUSCULAR
  Filled 2020-05-18: qty 10

## 2020-05-18 MED ORDER — METOCLOPRAMIDE HCL 5 MG PO TABS: 5.0000 mg | ORAL_TABLET | Freq: Three times a day (TID) | ORAL | 0 refills | Status: DC | PRN

## 2020-05-18 NOTE — ED Triage Notes (Signed)
Pt reports is [redacted] weeks pregnant and is having pain in her lower abd. Pt denies bleeding. Pt states her MD is aware and did an [place] test but noone has ever called her and told her anything and she is still having pain

## 2020-05-18 NOTE — ED Triage Notes (Signed)
Pt concerned she has an infections stating that she has yellow discharge down their and it feels more wet than usual. Pt tearful in triage concerned about her baby

## 2020-05-18 NOTE — ED Notes (Signed)
PA, Jenise to bedside; pelvic exam performed.

## 2020-05-18 NOTE — Discharge Instructions (Signed)
Your labs were essentially normal at this time. You are being treated for a possible vaginal infection. You have been given a single dose of 2 different antibiotics. Follow-up with your preferred OB provider or return if needed.

## 2020-05-18 NOTE — ED Provider Notes (Signed)
[facility] Emergency Department Provider Note ____________________________________________  Time seen: 1318  I have reviewed the triage vital signs and the nursing notes.  HISTORY  Chief Complaint  Abdominal Pain  HPI Cheryl Wong is a 24 y.o. female with a confirmed single IUP, presents to the ED for worsening vaginal discharge and intermittent pelvic pain.  Patient describes onset of a malodorous, copious yellow vaginal discharge about 6 weeks prior.  She had a recent ultrasound about 3 weeks ago that was normal without any signs of harm to the developing fetus.  She describes onset today of a sharp pulling pelvic pain that lasted approximately 15 minutes, and then resolved spontaneously.  She denies any abnormal vaginal bleeding.   Past Medical History:  Diagnosis Date  . Acute cholecystitis 10/04/2016  . Asthma   . Biliary colic   . Dyshidrotic eczema   . Psoriasis   . RUQ pain     Patient Active Problem List   Diagnosis Date Noted  . Postpartum hemorrhage 11/21/2016  . Acute blood loss anemia 11/21/2016  . Indication for care in labor or delivery 11/20/2016  . Indication for care or intervention in labor or delivery 11/20/2016  . Acute cholecystitis 10/04/2016  . RUQ pain   . Biliary colic     History reviewed. No pertinent surgical history.  Prior to Admission medications   Medication Sig Start Date End Date Taking? Authorizing Provider  azelaic acid (AZELEX) 20 % cream Apply topically. 08/29/16   [provider]  benzocaine-Menthol (DERMOPLAST) 20-0.5 % AERO Apply 1 application topically as needed for irritation (perineal discomfort). Patient not taking: Reported on 09/19/2018 11/22/16   Christeen Douglas, MD  cetirizine (ZYRTEC) 10 MG tablet Take 10 mg by mouth.    [provider]  clindamycin (CLINDAGEL) 1 % gel Apply topically.    [provider]  dibucaine (NUPERCAINAL) 1 % OINT Place 1 application rectally as  needed for hemorrhoids. Patient not taking: Reported on 09/19/2018 11/22/16   Christeen Douglas, MD  docusate sodium (COLACE) 100 MG capsule Take 1 capsule (100 mg total) by mouth 2 (two) times daily. Patient not taking: Reported on 09/19/2018 11/22/16   Christeen Douglas, MD  ibuprofen (ADVIL,MOTRIN) 600 MG tablet Take 1 tablet (600 mg total) by mouth every 6 (six) hours. Patient not taking: Reported on 09/19/2018 11/22/16   Christeen Douglas, MD  metoCLOPramide (REGLAN) 5 MG tablet Take 1 tablet (5 mg total) by mouth every 8 (eight) hours as needed for up to 5 days for nausea or vomiting. 05/18/20 05/23/20  Carlita Whitcomb, Charlesetta Ivory, PA-C  Norgestimate-Ethinyl Estradiol Triphasic (TRI-SPRINTEC) 0.18/0.215/0.25 MG-35 MCG tablet Take 1 tablet by mouth daily. 12/31/19   Matt Holmes, PA  Norgestimate-Ethinyl Estradiol Triphasic 0.18/0.215/0.25 MG-25 MCG tab Take 1 tablet by mouth daily for 28 days. 11/03/19 12/01/19  Sciora, Austin Miles, CNM  oxyCODONE (OXY IR/ROXICODONE) 5 MG immediate release tablet Take 1 tablet (5 mg total) by mouth every 4 (four) hours as needed (pain scale 4-7). Patient not taking: Reported on 09/19/2018 11/22/16   Christeen Douglas, MD  Prenatal Vit-Fe Fumarate-FA (MULTIVITAMIN-PRENATAL) 27-0.8 MG TABS tablet Take 1 tablet by mouth daily at 12 noon.    [provider]  ranitidine (ZANTAC) 150 MG tablet Take 150 mg by mouth 2 (two) times daily.    [provider]  triamcinolone (KENALOG) 0.025 % cream Apply topically.    [provider]  witch hazel-glycerin (TUCKS) pad Apply 1 application topically as needed for  hemorrhoids. Patient not taking: Reported on 09/19/2018 11/22/16   Christeen Douglas, MD    Allergies Patient has no known allergies.  Family History  Problem Relation Age of Onset  . Diabetes Mother   . Hypertension Father     Social History Social History   Tobacco Use  . Smoking status: Never Smoker  . Smokeless tobacco: Never Used  Substance Use Topics   . Alcohol use: No  . Drug use: No    Review of Systems  Constitutional: Negative for fever. Cardiovascular: Negative for chest pain. Respiratory: Negative for shortness of breath. Gastrointestinal: Negative for abdominal pain, vomiting and diarrhea. Genitourinary: Negative for dysuria.  Reports vaginal discharge as above. Musculoskeletal: Negative for back pain. Neurological: Negative for headaches, focal weakness or numbness. ____________________________________________  PHYSICAL EXAM:  VITAL SIGNS: ED Triage Vitals  Enc Vitals Group     BP 05/18/20 0939 114/72     Pulse Rate 05/18/20 0939 89     Resp 05/18/20 0939 20     Temp 05/18/20 0939 (!) 97.5 F (36.4 C)     Temp Source 05/18/20 0939 Oral     SpO2 05/18/20 0939 99 %     Weight 05/18/20 0940 140 lb (63.5 kg)     Height 05/18/20 0940 5\' 2"  (1.575 m)     Head Circumference --      Peak Flow --      Pain Score 05/18/20 0940 8     Pain Loc --      Pain Edu? --      Excl. in GC? --     Constitutional: Alert and oriented. Well appearing and in no distress. Head: Normocephalic and atraumatic. Eyes: Conjunctivae are normal. Normal extraocular movements Cardiovascular: Normal rate, regular rhythm. Normal distal pulses. Respiratory: Normal respiratory effort. No wheezes/rales/rhonchi. Gastrointestinal: Soft and nontender. No distention. GU: Normal external genitalia.  Patient with copious amounts of yellowish-greenish thick discharge in the cervical vault.  There is friability to the cervical os with endocervical collection.  No CMT or adnexal masses are appreciated. Musculoskeletal: Nontender with normal range of motion in all extremities.  Neurologic:  Normal gait without ataxia. Normal speech and language. No gross focal neurologic deficits are appreciated. Skin:  Skin is warm, dry and intact. No rash noted. Psychiatric: Mood and affect are normal. Patient exhibits appropriate insight and  judgment. ____________________________________________   LABS (pertinent positives/negatives)  Labs Reviewed  WET PREP, GENITAL - Abnormal; Notable for the following components:      Result Value   Yeast Wet Prep HPF POC PRESENT (*)    WBC, Wet Prep HPF POC MANY (*)    All other components within normal limits  COMPREHENSIVE METABOLIC PANEL - Abnormal; Notable for the following components:   CO2 20 (*)    BUN <5 (*)    Creatinine, Ser 0.37 (*)    ALT 45 (*)    All other components within normal limits  URINALYSIS, COMPLETE (UACMP) WITH MICROSCOPIC - Abnormal; Notable for the following components:   Color, Urine YELLOW (*)    APPearance HAZY (*)    Leukocytes,Ua LARGE (*)    All other components within normal limits  HCG, QUANTITATIVE, PREGNANCY - Abnormal; Notable for the following components:   hCG, Beta Chain, Quant, S 07/18/20 (*)    All other components within normal limits  CHLAMYDIA/NGC RT PCR ([facility] ONLY)  LIPASE, BLOOD  CBC  ____________________________________________   RADIOLOGY  [id number] OB Complete >14 Weeks  IMPRESSION: Single intrauterine gestation  with an estimated gestational age of [redacted] weeks 4 days by crown-rump length measurement. This is concordant with both LMP and prior sonographic estimations.  No acute sonographically evident complication. ____________________________________________  PROCEDURES  Reglan 10 mg PO Azithromycin 1 g PO Rocephin 500 mg IM  Procedures ____________________________________________  INITIAL IMPRESSION / ASSESSMENT AND PLAN / ED COURSE  DDX: PID, vaginitis/cervicitis, SAb  Patient at [redacted] weeks gestation with a single IUP confirmed by prior ultrasound, presents for about 6 to 8 weeks complaint of worsening pelvic discharge patient also reports intermittent and increasing pelvic pain.  Patient denies any abnormal vaginal bleeding.  She was found to have normal blood labs, and wet prep that showed white blood cells and  yeast.  Her exam was concerning however, for a vaginitis due to gonorrhea/chlamydia.  Patient was treated empirically with azithromycin and Rocephin.  She is encouraged to select follow-up with a local OB provider.  Prescription for Reglan is provided for nausea and vomiting.  Patient will follow up as discussed and return to the ED if needed.  Cheryl Wong was evaluated in Emergency Department on 05/18/2020 for the symptoms described in the history of present illness. She was evaluated in the context of the global COVID-19 pandemic, which necessitated consideration that the patient might be at risk for infection with the SARS-CoV-2 virus that causes COVID-19. Institutional protocols and algorithms that pertain to the evaluation of patients at risk for COVID-19 are in a state of rapid change based on information released by regulatory bodies including the CDC and federal and state organizations. These policies and algorithms were followed during the patient's care in the ED. ____________________________________________  FINAL CLINICAL IMPRESSION(S) / ED DIAGNOSES  Final diagnoses:  Pelvic pain  Vaginitis affecting pregnancy in first trimester, antepartum      Lissa Hoard, PA-C 05/18/20 1737    Delton Prairie, MD 05/19/20 1627

## 2020-07-03 ENCOUNTER — Other Ambulatory Visit: Payer: Self-pay

## 2020-07-03 ENCOUNTER — Other Ambulatory Visit (HOSPITAL_COMMUNITY)
Admission: RE | Admit: 2020-07-03 | Discharge: 2020-07-03 | Disposition: A | Discharge status: Home / Self Care | Payer: Medicaid Other | Source: Ambulatory Visit | Attending: Certified Nurse Midwife | Admitting: Certified Nurse Midwife

## 2020-07-03 ENCOUNTER — Ambulatory Visit (INDEPENDENT_AMBULATORY_CARE_PROVIDER_SITE_OTHER): Payer: Medicaid Other | Admitting: Certified Nurse Midwife

## 2020-07-03 ENCOUNTER — Encounter: Payer: Self-pay | Admitting: Obstetrics and Gynecology

## 2020-07-03 VITALS — BP 124/67 | HR 112 | Wt 149.1 lb

## 2020-07-03 DIAGNOSIS — N898 Other specified noninflammatory disorders of vagina: Secondary | ICD-10-CM

## 2020-07-03 DIAGNOSIS — Z3A15 15 weeks gestation of pregnancy: Secondary | ICD-10-CM | POA: Insufficient documentation

## 2020-07-03 DIAGNOSIS — Z113 Encounter for screening for infections with a predominantly sexual mode of transmission: Secondary | ICD-10-CM

## 2020-07-03 DIAGNOSIS — Z1379 Encounter for other screening for genetic and chromosomal anomalies: Secondary | ICD-10-CM

## 2020-07-03 DIAGNOSIS — Z130 Encounter for screening for diseases of the blood and blood-forming organs and certain disorders involving the immune mechanism: Secondary | ICD-10-CM

## 2020-07-03 DIAGNOSIS — O26892 Other specified pregnancy related conditions, second trimester: Secondary | ICD-10-CM | POA: Diagnosis present

## 2020-07-03 DIAGNOSIS — Z3482 Encounter for supervision of other normal pregnancy, second trimester: Secondary | ICD-10-CM | POA: Diagnosis present

## 2020-07-03 DIAGNOSIS — O0930 Supervision of pregnancy with insufficient antenatal care, unspecified trimester: Secondary | ICD-10-CM | POA: Insufficient documentation

## 2020-07-03 LAB — POCT URINALYSIS DIPSTICK OB
Bilirubin, UA: NEGATIVE
Blood, UA: NEGATIVE
Glucose, UA: NEGATIVE
Ketones, UA: NEGATIVE
Nitrite, UA: NEGATIVE
POC,PROTEIN,UA: NEGATIVE
Spec Grav, UA: 1.015 (ref 1.010–1.025)
Urobilinogen, UA: 0.2 E.U./dL
pH, UA: 7.5 (ref 5.0–8.0)

## 2020-07-03 NOTE — Progress Notes (Signed)
NEW OB HISTORY AND PHYSICAL  SUBJECTIVE:       Cheryl Wong is a 24 y.o. G50P1001 female, Patient's last menstrual period was 03/23/2020., Estimated Date of Delivery: 12/24/20, [redacted]w[redacted]d, presents today for establishment of Prenatal Care.   Pregnancy confirmed at [facility].   She has no unusual complaints. Endorses nausea with occasional vomiting, breast tenderness, round ligament pain and increased vaginal discharge.   Denies difficulty breathing or respiratory distress, chest pain, dysuria, and leg pain or swelling.    Gynecologic History  Patient's last menstrual period was 03/23/2020.   Contraception: none   Last Pap: 10/2019. Results were: normal  Obstetric History  OB History  Gravida Para Term Preterm AB Living  2 1 1     1   SAB TAB Ectopic Multiple Live Births        0 1    # Outcome Date GA Lbr Len/2nd Weight Sex Delivery Anes PTL Lv  2 Current           1 Term 11/20/16 [redacted]w[redacted]d / 02:47 7 lb 10.1 oz (3.46 kg) M Vag-Forceps EPI, Local  LIV    Past Medical History:  Diagnosis Date  . Acute cholecystitis 10/04/2016  . Asthma   . Biliary colic   . Dyshidrotic eczema   . Psoriasis   . RUQ pain     History reviewed. No pertinent surgical history.  Current Outpatient Medications on File Prior to Visit  Medication Sig Dispense Refill  . azelaic acid (AZELEX) 20 % cream Apply topically.    . Prenatal Vit-Fe Fumarate-FA (MULTIVITAMIN-PRENATAL) 27-0.8 MG TABS tablet Take 1 tablet by mouth daily at 12 noon.    . triamcinolone (KENALOG) 0.025 % cream Apply topically.    . Norgestimate-Ethinyl Estradiol Triphasic 0.18/0.215/0.25 MG-25 MCG tab Take 1 tablet by mouth daily for 28 days. 1 Package 2   No current facility-administered medications on file prior to visit.    No Known Allergies  Social History   Socioeconomic History  . Marital status: Married    Spouse name: Not on file  . Number of children: Not on file  . Years of education: Not on file  . Highest  education level: Not on file  Occupational History  . Not on file  Tobacco Use  . Smoking status: Never Smoker  . Smokeless tobacco: Never Used  Vaping Use  . Vaping Use: Never used  Substance and Sexual Activity  . Alcohol use: No  . Drug use: No  . Sexual activity: Yes  Other Topics Concern  . Not on file  Social History Narrative  . Not on file   Social Determinants of Health   Financial Resource Strain:   . Difficulty of Paying Living Expenses: Not on file  Food Insecurity:   . Worried About 10[birth year]14-1981 in the Last Year: Not on file  . Ran Out of Food in the Last Year: Not on file  Transportation Needs:   . Lack of Transportation (Medical): Not on file  . Lack of Transportation (Non-Medical): Not on file  Physical Activity:   . Days of Exercise per Week: Not on file  . Minutes of Exercise per Session: Not on file  Stress:   . Feeling of Stress : Not on file  Social Connections:   . Frequency of Communication with Friends and Family: Not on file  . Frequency of Social Gatherings with Friends and Family: Not on file  . Attends Religious Services: Not on file  .  Active Member of Clubs or Organizations: Not on file  . Attends Banker Meetings: Not on file  . Marital Status: Not on file  Intimate Partner Violence:   . Fear of Current or Ex-Partner: Not on file  . Emotionally Abused: Not on file  . Physically Abused: Not on file  . Sexually Abused: Not on file    Family History  Problem Relation Age of Onset  . Diabetes Mother   . Hypertension Father     The following portions of the patient's history were reviewed and updated as appropriate: allergies, current medications, past OB history, past medical history, past surgical history, past family history, past social history, and problem list.  Review of Systems:  ROS negative except as noted above. Information obtained from patient.    OBJECTIVE:  BP 124/67   Pulse (!) 112   Wt 149 lb  1.6 oz (67.6 kg)   LMP 03/23/2020   BMI 27.27 kg/m   Initial Physical Exam (New OB)  GENERAL APPEARANCE: alert, well appearing, in no apparent distress  HEAD: normocephalic, atraumatic  MOUTH: mucous membranes moist, pharynx normal without lesions  BREASTS: patient declined exam  LUNGS: clear to auscultation, no wheezes, rales or rhonchi, symmetric air entry  HEART: regular rate and rhythm, no murmurs  ABDOMEN: soft, nontender, nondistended, no abnormal masses, no epigastric pain, fundus soft, nontender 16 weeks size and FHT present  EXTREMITIES: no redness or tenderness in the calves or thighs, no edema  SKIN: normal coloration and turgor, no rashes  LYMPH NODES: no adenopathy palpable  NEUROLOGIC: alert, oriented, normal speech, no focal findings or movement disorder noted  PELVIC EXAM EXTERNAL GENITALIA: normal appearing vulva with no masses, tenderness or lesions VAGINA: discharge thin white present; swab collected  ASSESSMENT: Normal pregnancy Late entry prenatal care Genetic screening  PLAN: Prenatal care New OB counseling: The patient has been given an overview regarding routine prenatal care. Recommendations regarding diet, weight gain, and exercise in pregnancy were given. Prenatal testing, optional genetic testing, and ultrasound use in pregnancy were reviewed.  Benefits of Breast Feeding were discussed. The patient is encouraged to consider nursing her baby post partum. See orders

## 2020-07-03 NOTE — Progress Notes (Signed)
NOB PE present for prenatal care. Pt c/o of lower abd pain.

## 2020-07-03 NOTE — Patient Instructions (Signed)
Healthy Weight Gain During Pregnancy, Adult A certain amount of weight gain during pregnancy is normal and healthy. How much weight you should gain depends on your overall health and a measurement called BMI (body mass index). BMI is an estimate of your body fat based on your height and weight. You can use an online calculator to figure out your BMI, or you can ask your health care provider to calculate it for you at your next visit. Your recommended pregnancy weight gain is based on your pre-pregnancy BMI. General guidelines for a healthy total weight gain during pregnancy are listed below. If your BMI at or before the start of your pregnancy is:  Less than 18.5 (underweight), you should gain 28-40 lb (13-18 kg).  18.5-24.9 (normal weight), you should gain 25-35 lb (11-16 kg).  25-29.9 (overweight), you should gain 15-25 lb (7-11 kg).  30 or higher (obese), you should gain 11-20 lb (5-9 kg). These ranges vary depending on your individual health. If you are carrying more than one baby (multiples), it may be safe to gain more weight than these recommendations. If you gain less weight than recommended, that may be safe as long as your baby is growing and developing normally. How can unhealthy weight gain affect me and my baby? Gaining too much weight during pregnancy can lead to pregnancy complications, such as:  A temporary form of diabetes that develops during pregnancy (gestational diabetes).  High blood pressure during pregnancy and protein in your urine (preeclampsia).  High blood pressure during pregnancy without protein in your urine (gestational hypertension).  Your baby having a high weight at birth, which may: ? Raise your risk of having a more difficult delivery or a surgical delivery (cesarean delivery, or C-section). ? Raise your child's risk of developing obesity during childhood. Not gaining enough weight can be life-threatening for your baby, and it may raise your baby's chances  of:  Being born early (preterm).  Growing more slowly than normal during pregnancy (growth restriction).  Having a low weight at birth. What actions can I take to gain a healthy amount of weight during pregnancy? General instructions  Keep track of your weight gain during pregnancy.  Take over-the-counter and prescription medicines only as told by your health care provider. Take all prenatal supplements as directed.  Keep all health care visits during pregnancy (prenatal visits). These visits are a good time to discuss your weight gain. Your health care provider will weigh you at each visit to make sure you are gaining a healthy amount of weight. Nutrition   Eat a balanced, nutrient-rich diet. Eat plenty of: ? Fruits and vegetables, such as berries and broccoli. ? Whole grains, such as millet, barley, whole-wheat breads and cereals, and oatmeal. ? Low-fat dairy products or non-dairy products such as almond milk or rice milk. ? Protein foods, such as lean meat, chicken, eggs, and legumes (such as peas, beans, soybeans, and lentils).  Avoid foods that are fried or have a lot of fat, salt (sodium), or sugar.  Drink enough fluid to keep your urine pale yellow.  Choose healthy snack and drink options when you are at work or on the go: ? Drink water. Avoid soda, sports drinks, and juices that have added sugar. ? Avoid drinks with caffeine, such as coffee and energy drinks. ? Eat snacks that are high in protein, such as nuts, protein bars, and low-fat yogurt. ? Carry convenient snacks in your purse that do not need refrigeration, such as a pack of   trail mix, an apple, or a granola bar.  If you need help improving your diet, work with a health care provider or a diet and nutrition specialist (dietitian). Activity   Exercise regularly, as told by your health care provider. ? If you were active before becoming pregnant, you may be able to continue your regular fitness activities. ? If  you were not active before pregnancy, you may gradually build up to exercising for 30 or more minutes on most days of the week. This may include walking, swimming, or yoga.  Ask your health care provider what activities are safe for you. Talk with your health care provider about whether you may need to be excused from certain school or work activities. Where to find more information Learn more about managing your weight gain during pregnancy from:  American Pregnancy Association: www.americanpregnancy.org  [place] Department of Agriculture pregnancy weight gain calculator: [employer] Summary  Too much weight gain during pregnancy can lead to complications for you and your baby.  Find out your pre-pregnancy BMI to determine how much weight gain is healthy for you.  Eat nutritious foods and stay active.  Keep all of your prenatal visits as told by your health care provider. This information is not intended to replace advice given to you by your health care provider. Make sure you discuss any questions you have with your health care provider. Document Revised: 05/26/2019 Document Reviewed: 05/23/2017 Elsevier Patient Education  [place].   Exercise During Pregnancy Exercise is an important part of being healthy for people of all ages. Exercise improves the function of your heart and lungs and helps you maintain strength, flexibility, and a healthy body weight. Exercise also boosts energy levels and elevates mood. Most women should exercise regularly during pregnancy. In rare cases, women with certain medical conditions or complications may be asked to limit or avoid exercise during pregnancy. How does this affect me? Along with maintaining general strength and flexibility, exercising during pregnancy can help:  Keep strength in muscles that are used during labor and childbirth.  Decrease low back pain.  Reduce symptoms of depression.  Control weight gain during  pregnancy.  Reduce the risk of needing insulin if you develop diabetes during pregnancy.  Decrease the risk of cesarean delivery.  Speed up your recovery after giving birth. How does this affect my baby? Exercise can help you have a healthy pregnancy. Exercise does not cause premature birth. It will not cause your baby to weigh less at birth. What exercises can I do? Many exercises are safe for you to do during pregnancy. Do a variety of exercises that safely increase your heart and breathing rates and help you build and maintain muscle strength. Do exercises exactly as told by your health care provider. You may do these exercises:  Walking or hiking.  Swimming.  Water aerobics.  Riding a stationary bike.  Strength training.  Modified yoga or Pilates. Tell your instructor that you are pregnant. Avoid overstretching, and avoid lying on your back for long periods of time.  Running or jogging. Only choose this type of exercise if you: ? Ran or jogged regularly before your pregnancy. ? Can run or jog and still talk in complete sentences. What exercises should I avoid? Depending on your level of fitness and whether you exercised regularly before your pregnancy, you may be told to limit high-intensity exercise. You can tell that you are exercising at a high intensity if you are breathing much harder and faster and  cannot hold a conversation while exercising. You must avoid:  Contact sports.  Activities that put you at risk for falling on or being hit in the belly, such as downhill skiing, water skiing, surfing, rock climbing, cycling, gymnastics, and horseback riding.  Scuba diving.  Skydiving.  Yoga or Pilates in a room that is heated to high temperatures.  Jogging or running, unless you ran or jogged regularly before your pregnancy. While jogging or running, you should always be able to talk in full sentences. Do not run or jog so fast that you are unable to have a  conversation.  Do not exercise at more than 6,000 feet above sea level (high elevation) if you are not used to exercising at high elevation. How do I exercise in a safe way?   Avoid overheating. Do not exercise in very high temperatures.  Wear loose-fitting, breathable clothes.  Avoid dehydration. Drink enough water before, during, and after exercise to keep your urine pale yellow.  Avoid overstretching. Because of hormone changes during pregnancy, it is easy to overstretch muscles, tendons, and ligaments during pregnancy.  Start slowly and ask your health care provider to recommend the types of exercise that are safe for you.  Do not exercise to lose weight. Follow these instructions at home:  Exercise on most days or all days of the week. Try to exercise for 30 minutes a day, 5 days a week, unless your health care provider tells you not to.  If you actively exercised before your pregnancy and you are healthy, your health care provider may tell you to continue to do moderate to high-intensity exercise.  If you are just starting to exercise or did not exercise much before your pregnancy, your health care provider may tell you to do low to moderate-intensity exercise. Questions to ask your health care provider  Is exercise safe for me?  What are signs that I should stop exercising?  Does my health condition mean that I should not exercise during pregnancy?  When should I avoid exercising during pregnancy? Stop exercising and contact a health care provider if: You have any unusual symptoms, such as:  Mild contractions of the uterus or cramps in the abdomen.  Dizziness that does not go away when you rest. Stop exercising and get help right away if: You have any unusual symptoms, such as:  Sudden, severe pain in your low back or your belly.  Mild contractions of the uterus or cramps in the abdomen that do not improve with rest and drinking fluids.  Chest pain.  Bleeding or  fluid leaking from your vagina.  Shortness of breath. These symptoms may represent a serious problem that is an emergency. Do not wait to see if the symptoms will go away. Get medical help right away. Call your local emergency services (911 in the [place] Do not drive yourself to the hospital. Summary  Most women should exercise regularly throughout pregnancy. In rare cases, women with certain medical conditions or complications may be asked to limit or avoid exercise during pregnancy.  Do not exercise to lose weight during pregnancy.  Your health care provider will tell you what level of physical activity is right for you.  Stop exercising and contact a health care provider if you have mild contractions of the uterus or cramps in the abdomen. Get help right away if these contractions or cramps do not improve with rest and drinking fluids.  Stop exercising and get help right away if you have sudden, severe  pain in your low back or belly, chest pain, shortness of breath, or bleeding or leaking of fluid from your vagina. This information is not intended to replace advice given to you by your health care provider. Make sure you discuss any questions you have with your health care provider. Document Revised: 12/24/2018 Document Reviewed: 10/07/2018 Elsevier Patient Education  2020 [employer] Inc.   Common Medications Safe in Pregnancy  Acne:      Constipation:  Benzoyl Peroxide     Colace  Clindamycin      Dulcolax Suppository  Topica Erythromycin     Fibercon  Salicylic Acid      Metamucil         Miralax AVOID:        Senakot   Accutane    Cough:  Retin-A       Cough Drops  Tetracycline      Phenergan w/ Codeine if Rx  Minocycline      Robitussin (Plain & DM)  Antibiotics:     Crabs/Lice:  Ceclor       RID  Cephalosporins    AVOID:  E-Mycins      Kwell  Keflex  Macrobid/Macrodantin   Diarrhea:  Penicillin      Kao-Pectate  Zithromax      Imodium AD         PUSH  FLUIDS AVOID:       Cipro     Fever:  Tetracycline      Tylenol (Regular or Extra  Minocycline       Strength)  Levaquin      Extra Strength-Do not          Exceed 8 tabs/24 hrs Caffeine:        <200mg/day (equiv. To 1 cup of coffee or  approx. 3 12 oz sodas)         Gas: Cold/Hayfever:       Gas-X  Benadryl      Mylicon  Claritin       Phazyme  **Claritin-D        Chlor-Trimeton    Headaches:  Dimetapp      ASA-Free Excedrin  Drixoral-Non-Drowsy     Cold Compress  Mucinex (Guaifenasin)     Tylenol (Regular or Extra  Sudafed/Sudafed-12 Hour     Strength)  **Sudafed PE Pseudoephedrine   Tylenol Cold & Sinus     Vicks Vapor Rub  Zyrtec  **AVOID if Problems With Blood Pressure         Heartburn: Avoid lying down for at least 1 hour after meals  Aciphex      Maalox     Rash:  Milk of Magnesia     Benadryl    Mylanta       1% Hydrocortisone Cream  Pepcid  Pepcid Complete   Sleep Aids:  Prevacid      Ambien   Prilosec       Benadryl  Rolaids       Chamomile Tea  Tums (Limit 4/day)     Unisom         Tylenol PM         Warm milk-add vanilla or  Hemorrhoids:       Sugar for taste  Anusol/Anusol H.C.  (RX: Analapram 2.5%)  Sugar Substitutes:  Hydrocortisone OTC     Ok in moderation  Preparation H      Tucks        Vaseline lotion applied to tissue with wiping      Herpes:     Throat:  Acyclovir      Oragel  Famvir  Valtrex     Vaccines:         Flu Shot Leg Cramps:       *Gardasil  Benadryl      Hepatitis A         Hepatitis B Nasal Spray:       Pneumovax  Saline Nasal Spray     Polio Booster         Tetanus Nausea:       Tuberculosis test or PPD  Vitamin B6 25 mg TID   AVOID:    Dramamine      *Gardasil  Emetrol       Live Poliovirus  Ginger Root 250 mg QID    MMR (measles, mumps &  High Complex Carbs @ Bedtime    rebella)  Sea Bands-Accupressure    Varicella (Chickenpox)  Unisom 1/2 tab TID     *No known complications           If received  before Pain:         Known pregnancy;   Darvocet       Resume series after  Lortab        Delivery  Percocet    Yeast:   Tramadol      Femstat  Tylenol 3      Gyne-lotrimin  Ultram       Monistat  Vicodin           MISC:         All Sunscreens           Hair Coloring/highlights          Insect Repellant's          (Including DEET)         Mystic Tans    Second Trimester of Pregnancy  The second trimester is from week 14 through week 27 (month 4 through 6). This is often the time in pregnancy that you feel your best. Often times, morning sickness has lessened or quit. You may have more energy, and you may get hungry more often. Your unborn baby is growing rapidly. At the end of the sixth month, he or she is about 9 inches long and weighs about 1 pounds. You will likely feel the baby move between 18 and 20 weeks of pregnancy. Follow these instructions at home: Medicines  Take over-the-counter and prescription medicines only as told by your doctor. Some medicines are safe and some medicines are not safe during pregnancy.  Take a prenatal vitamin that contains at least 600 micrograms (mcg) of folic acid.  If you have trouble pooping (constipation), take medicine that will make your stool soft (stool softener) if your doctor approves. Eating and drinking   Eat regular, healthy meals.  Avoid raw meat and uncooked cheese.  If you get low calcium from the food you eat, talk to your doctor about taking a daily calcium supplement.  Avoid foods that are high in fat and sugars, such as fried and sweet foods.  If you feel sick to your stomach (nauseous) or throw up (vomit): ? Eat 4 or 5 small meals a day instead of 3 large meals. ? Try eating a few soda crackers. ? Drink liquids between meals instead of during meals.  To prevent constipation: ? Eat foods that are high in fiber, like fresh fruits and vegetables, whole grains, and beans. ? Drink enough fluids   keep your pee (urine)  clear or pale yellow. Activity  Exercise only as told by your doctor. Stop exercising if you start to have cramps.  Do not exercise if it is too hot, too humid, or if you are in a place of great height (high altitude).  Avoid heavy lifting.  Wear low-heeled shoes. Sit and stand up straight.  You can continue to have sex unless your doctor tells you not to. Relieving pain and discomfort  Wear a good support bra if your breasts are tender.  Take warm water baths (sitz baths) to soothe pain or discomfort caused by hemorrhoids. Use hemorrhoid cream if your doctor approves.  Rest with your legs raised if you have leg cramps or low back pain.  If you develop puffy, bulging veins (varicose veins) in your legs: ? Wear support hose or compression stockings as told by your doctor. ? Raise (elevate) your feet for 15 minutes, 3-4 times a day. ? Limit salt in your food. Prenatal care  Write down your questions. Take them to your prenatal visits.  Keep all your prenatal visits as told by your doctor. This is important. Safety  Wear your seat belt when driving.  Make a list of emergency phone numbers, including numbers for family, friends, the hospital, and police and fire departments. General instructions  Ask your doctor about the right foods to eat or for help finding a counselor, if you need these services.  Ask your doctor about local prenatal classes. Begin classes before month 6 of your pregnancy.  Do not use hot tubs, steam rooms, or saunas.  Do not douche or use tampons or scented sanitary pads.  Do not cross your legs for long periods of time.  Visit your dentist if you have not done so. Use a soft toothbrush to brush your teeth. Floss gently.  Avoid all smoking, herbs, and alcohol. Avoid drugs that are not approved by your doctor.  Do not use any products that contain nicotine or tobacco, such as cigarettes and e-cigarettes. If you need help quitting, ask your  doctor.  Avoid cat litter boxes and soil used by cats. These carry germs that can cause birth defects in the baby and can cause a loss of your baby (miscarriage) or stillbirth. Contact a doctor if:  You have mild cramps or pressure in your lower belly.  You have pain when you pee (urinate).  You have bad smelling fluid coming from your vagina.  You continue to feel sick to your stomach (nauseous), throw up (vomit), or have watery poop (diarrhea).  You have a nagging pain in your belly area.  You feel dizzy. Get help right away if:  You have a fever.  You are leaking fluid from your vagina.  You have spotting or bleeding from your vagina.  You have severe belly cramping or pain.  You lose or gain weight rapidly.  You have trouble catching your breath and have chest pain.  You notice sudden or extreme puffiness (swelling) of your face, hands, ankles, feet, or legs.  You have not felt the baby move in over an hour.  You have severe headaches that do not go away when you take medicine.  You have trouble seeing. Summary  The second trimester is from week 14 through week 27 (months 4 through 6). This is often the time in pregnancy that you feel your best.  To take care of yourself and your unborn baby, you will need to eat healthy meals, take  medicines only if your doctor tells you to do so, and do activities that are safe for you and your baby.  Call your doctor if you get sick or if you notice anything unusual about your pregnancy. Also, call your doctor if you need help with the right food to eat, or if you want to know what activities are safe for you. This information is not intended to replace advice given to you by your health care provider. Make sure you discuss any questions you have with your health care provider. Document Revised: 12/25/2018 Document Reviewed: 10/08/2016 Elsevier Patient Education  [place].

## 2020-07-03 NOTE — Progress Notes (Signed)
Pt presents for NOB nurse interview visit. Pregnancy confirmation done 05/18/2020 ultrasound . G2 . P1001  . Pregnancy education material explained and given. No cats in the home. NOB labs ordered. HIV labs and Drug screen were explained optional and she declined. Drug screen declined. Pt taking PNV . Genetic screening options discussed . Genetic testing: Ordered. Financial policy reviewed. FMLA paperwork policy reviewed and signed. All questions answered.

## 2020-07-04 LAB — CERVICOVAGINAL ANCILLARY ONLY
Bacterial Vaginitis (gardnerella): POSITIVE — AB
Candida Glabrata: NEGATIVE
Candida Vaginitis: POSITIVE — AB
Chlamydia: NEGATIVE
Comment: NEGATIVE
Comment: NEGATIVE
Comment: NEGATIVE
Comment: NEGATIVE
Comment: NEGATIVE
Comment: NORMAL
Neisseria Gonorrhea: NEGATIVE
Trichomonas: NEGATIVE

## 2020-07-04 LAB — CBC WITH DIFFERENTIAL/PLATELET
Basophils Absolute: 0 10*3/uL (ref 0.0–0.2)
Basos: 1 %
EOS (ABSOLUTE): 0.1 10*3/uL (ref 0.0–0.4)
Eos: 2 %
Hematocrit: 36.4 % (ref 34.0–46.6)
Hemoglobin: 12.7 g/dL (ref 11.1–15.9)
Immature Grans (Abs): 0.2 10*3/uL — ABNORMAL HIGH (ref 0.0–0.1)
Immature Granulocytes: 2 %
Lymphocytes Absolute: 1.8 10*3/uL (ref 0.7–3.1)
Lymphs: 21 %
MCH: 31.5 pg (ref 26.6–33.0)
MCHC: 34.9 g/dL (ref 31.5–35.7)
MCV: 90 fL (ref 79–97)
Monocytes Absolute: 0.4 10*3/uL (ref 0.1–0.9)
Monocytes: 5 %
Neutrophils Absolute: 6 10*3/uL (ref 1.4–7.0)
Neutrophils: 69 %
Platelets: 279 10*3/uL (ref 150–450)
RBC: 4.03 x10E6/uL (ref 3.77–5.28)
RDW: 12.7 % (ref 11.7–15.4)
WBC: 8.5 10*3/uL (ref 3.4–10.8)

## 2020-07-04 LAB — RUBELLA SCREEN: Rubella Antibodies, IGG: 4.99 index (ref >0.99)

## 2020-07-04 LAB — MICROSCOPIC EXAMINATION
Casts: NONE SEEN /lpf
RBC, Urine: NONE SEEN /hpf (ref 0–2)

## 2020-07-04 LAB — ABO AND RH: Rh Factor: POSITIVE

## 2020-07-04 LAB — URINALYSIS, ROUTINE W REFLEX MICROSCOPIC
Bilirubin, UA: NEGATIVE
Glucose, UA: NEGATIVE
Ketones, UA: NEGATIVE
Nitrite, UA: NEGATIVE
RBC, UA: NEGATIVE
Specific Gravity, UA: 1.017 (ref 1.005–1.030)
Urobilinogen, Ur: 0.2 mg/dL (ref 0.2–1.0)
pH, UA: 7.5 (ref 5.0–7.5)

## 2020-07-04 LAB — RPR: RPR Ser Ql: NONREACTIVE

## 2020-07-04 LAB — HEPATITIS B SURFACE ANTIGEN: Hepatitis B Surface Ag: NEGATIVE

## 2020-07-04 LAB — ANTIBODY SCREEN: Antibody Screen: NEGATIVE

## 2020-07-04 LAB — VARICELLA ZOSTER ANTIBODY, IGG: Varicella zoster IgG: 1241 index (ref >165)

## 2020-07-05 LAB — URINE CULTURE, OB REFLEX

## 2020-07-05 LAB — CULTURE, OB URINE

## 2020-07-06 ENCOUNTER — Encounter: Payer: Self-pay | Admitting: Certified Nurse Midwife

## 2020-07-06 ENCOUNTER — Telehealth: Payer: Self-pay

## 2020-07-06 ENCOUNTER — Other Ambulatory Visit: Payer: Self-pay

## 2020-07-06 DIAGNOSIS — N760 Acute vaginitis: Secondary | ICD-10-CM | POA: Insufficient documentation

## 2020-07-06 DIAGNOSIS — Z6740 Type O blood, Rh positive: Secondary | ICD-10-CM | POA: Insufficient documentation

## 2020-07-06 MED ORDER — TERCONAZOLE 0.4 % VA CREA: 1.0000 | TOPICAL_CREAM | Freq: Every day | VAGINAL | 0 refills | Status: DC

## 2020-07-06 MED ORDER — METRONIDAZOLE 500 MG PO TABS: 500.0000 mg | ORAL_TABLET | Freq: Two times a day (BID) | ORAL | 0 refills | Status: DC

## 2020-07-06 NOTE — Telephone Encounter (Signed)
Called pt per [name], CNM. Pt aware that NOB labs were normal. Informed pt swab came back positive for BV and yeast. Script for Flagyl and Terazol sent to pharmacy on file. Pt verbalized understanding.

## 2020-07-06 NOTE — Progress Notes (Signed)
Please contact patient. Blood type is O positive. Labs were normal for pregnancy except vaginal swab positive for yeast and bacterial vaginosis. Rx Terazol and Flagyl, please. Encouraged to activate MyChart. Thanks, [name], CNM

## 2020-07-06 NOTE — Telephone Encounter (Signed)
Called pt in regards to lab results. No answer

## 2020-07-11 ENCOUNTER — Telehealth: Payer: Self-pay

## 2020-07-11 NOTE — Telephone Encounter (Signed)
Called pt to inform panorama results came in. Pt stated she wanted me to tell her sister the baby's gender. Sister aware. Informed pt I could place results in envelope- pt verbalized understanding however, declined.

## 2020-08-01 ENCOUNTER — Ambulatory Visit (INDEPENDENT_AMBULATORY_CARE_PROVIDER_SITE_OTHER): Payer: Medicaid Other

## 2020-08-01 ENCOUNTER — Other Ambulatory Visit: Payer: Self-pay | Admitting: Certified Nurse Midwife

## 2020-08-01 ENCOUNTER — Ambulatory Visit (INDEPENDENT_AMBULATORY_CARE_PROVIDER_SITE_OTHER): Payer: Medicaid Other | Admitting: Certified Nurse Midwife

## 2020-08-01 ENCOUNTER — Other Ambulatory Visit: Payer: Self-pay

## 2020-08-01 ENCOUNTER — Encounter: Payer: Self-pay | Admitting: Certified Nurse Midwife

## 2020-08-01 ENCOUNTER — Other Ambulatory Visit (HOSPITAL_COMMUNITY)
Admission: RE | Admit: 2020-08-01 | Discharge: 2020-08-01 | Disposition: A | Discharge status: Home / Self Care | Payer: Medicaid Other | Source: Ambulatory Visit | Attending: Certified Nurse Midwife | Admitting: Certified Nurse Midwife

## 2020-08-01 VITALS — BP 102/64 | HR 94 | Wt 149.2 lb

## 2020-08-01 DIAGNOSIS — Z3A19 19 weeks gestation of pregnancy: Secondary | ICD-10-CM | POA: Insufficient documentation

## 2020-08-01 DIAGNOSIS — O26892 Other specified pregnancy related conditions, second trimester: Secondary | ICD-10-CM | POA: Diagnosis present

## 2020-08-01 DIAGNOSIS — Z3492 Encounter for supervision of normal pregnancy, unspecified, second trimester: Secondary | ICD-10-CM

## 2020-08-01 DIAGNOSIS — B9689 Other specified bacterial agents as the cause of diseases classified elsewhere: Secondary | ICD-10-CM | POA: Diagnosis not present

## 2020-08-01 DIAGNOSIS — B373 Candidiasis of vulva and vagina: Secondary | ICD-10-CM | POA: Diagnosis not present

## 2020-08-01 DIAGNOSIS — O23592 Infection of other part of genital tract in pregnancy, second trimester: Secondary | ICD-10-CM | POA: Insufficient documentation

## 2020-08-01 DIAGNOSIS — R3989 Other symptoms and signs involving the genitourinary system: Secondary | ICD-10-CM

## 2020-08-01 DIAGNOSIS — N898 Other specified noninflammatory disorders of vagina: Secondary | ICD-10-CM

## 2020-08-01 LAB — POCT URINALYSIS DIPSTICK OB
Bilirubin, UA: NEGATIVE
Blood, UA: NEGATIVE
Glucose, UA: NEGATIVE
Ketones, UA: NEGATIVE
Leukocytes, UA: NEGATIVE
Nitrite, UA: NEGATIVE
POC,PROTEIN,UA: NEGATIVE
Spec Grav, UA: 1.02 (ref 1.010–1.025)
Urobilinogen, UA: 0.2 E.U./dL
pH, UA: 5 (ref 5.0–8.0)

## 2020-08-01 MED ORDER — PRENATAL 27-0.8 MG PO TABS: 1.0000 | ORAL_TABLET | Freq: Every day | ORAL | 6 refills | Status: DC

## 2020-08-01 NOTE — Patient Instructions (Signed)

## 2020-08-01 NOTE — Progress Notes (Signed)
ROB doing well. Feels good movement. U/s compeleted today . Results reviewed. See below. Pt has low lying placenta, reviewed with her and information give. Discussed pelvic rest and precautions. Repeat u/s 28 wks. She c/o increased vaginal discharge, bladder discomfort , and asks about her gallbladder. State she has history with her last pregnancy. Discussed that gallbladder surgery is not done in pregnancy unless medically necessary and given that she is currently asymptomatic this would not be addressed during pregnancy. Discussed referral to GI . Urine culture and vaginal swab collected . Will follow up with result. Bleeding precautions reviewed. Follow up 4 wks or prn.   Doreene Burke, CNM   Patient Name: Cheryl Wong DOB: Jul 24, 1996 MRN: [id number] ULTRASOUND REPORT  Location: [facility] Date of Service: 08/01/2020   Indications:Anatomy Ultrasound Findings:  Mason Jim intrauterine pregnancy is visualized with FHR at 156 BPM. Biometrics give an (U/S) Gestational age of [redacted]w[redacted]d and an (U/S) EDD of 12/19/2020; this correlates with the clinically established Estimated Date of Delivery: 12/24/20  Fetal presentation is transverse.  EFW: 333 g ( 10 oz).  Placenta: anterior Low lying. Grade: 1 AFI: subjectively normal.  Anatomic survey is complete and normal; Gender - female.    Right Ovary is normal in appearance. Left Ovary is normal appearance. Survey of the adnexa demonstrates no adnexal masses. There is no free peritoneal fluid in the cul de sac.  Impression: 1. [redacted]w[redacted]d Viable Singleton Intrauterine pregnancy by U/S. 2. (U/S) EDD is consistent with Clinically established Estimated Date of Delivery: 12/24/20 . 3. Normal Anatomy Scan 4. Low lying anterior placenta.  Recommendations: 1.Clinical correlation with the patient's History and Physical Exam.   Jenine M. Marciano Sequin    RDMS

## 2020-08-02 ENCOUNTER — Other Ambulatory Visit: Payer: Self-pay | Admitting: Certified Nurse Midwife

## 2020-08-02 ENCOUNTER — Emergency Department
Admission: EM | Admit: 2020-08-02 | Discharge: 2020-08-03 | Disposition: A | Discharge status: Home / Self Care | Payer: Medicaid Other | Attending: Emergency Medicine | Admitting: Emergency Medicine

## 2020-08-02 ENCOUNTER — Telehealth: Payer: Self-pay

## 2020-08-02 ENCOUNTER — Encounter: Payer: Self-pay | Admitting: *Deleted

## 2020-08-02 ENCOUNTER — Emergency Department: Payer: Medicaid Other

## 2020-08-02 ENCOUNTER — Other Ambulatory Visit: Payer: Self-pay

## 2020-08-02 DIAGNOSIS — O23592 Infection of other part of genital tract in pregnancy, second trimester: Secondary | ICD-10-CM | POA: Insufficient documentation

## 2020-08-02 DIAGNOSIS — J45909 Unspecified asthma, uncomplicated: Secondary | ICD-10-CM | POA: Diagnosis not present

## 2020-08-02 DIAGNOSIS — Z3A20 20 weeks gestation of pregnancy: Secondary | ICD-10-CM | POA: Diagnosis not present

## 2020-08-02 DIAGNOSIS — Z7951 Long term (current) use of inhaled steroids: Secondary | ICD-10-CM | POA: Insufficient documentation

## 2020-08-02 DIAGNOSIS — O99512 Diseases of the respiratory system complicating pregnancy, second trimester: Secondary | ICD-10-CM | POA: Diagnosis not present

## 2020-08-02 DIAGNOSIS — O200 Threatened abortion: Secondary | ICD-10-CM | POA: Insufficient documentation

## 2020-08-02 DIAGNOSIS — B9689 Other specified bacterial agents as the cause of diseases classified elsewhere: Secondary | ICD-10-CM | POA: Diagnosis not present

## 2020-08-02 DIAGNOSIS — O209 Hemorrhage in early pregnancy, unspecified: Secondary | ICD-10-CM | POA: Diagnosis present

## 2020-08-02 DIAGNOSIS — N760 Acute vaginitis: Secondary | ICD-10-CM

## 2020-08-02 DIAGNOSIS — B373 Candidiasis of vulva and vagina: Secondary | ICD-10-CM | POA: Diagnosis not present

## 2020-08-02 DIAGNOSIS — B3731 Acute candidiasis of vulva and vagina: Secondary | ICD-10-CM

## 2020-08-02 LAB — URINALYSIS, COMPLETE (UACMP) WITH MICROSCOPIC
Bilirubin Urine: NEGATIVE
Glucose, UA: NEGATIVE mg/dL
Ketones, ur: NEGATIVE mg/dL
Nitrite: NEGATIVE
Protein, ur: NEGATIVE mg/dL
Specific Gravity, Urine: 1.006 (ref 1.005–1.030)
pH: 9 — ABNORMAL HIGH (ref 5.0–8.0)

## 2020-08-02 LAB — CERVICOVAGINAL ANCILLARY ONLY
Bacterial Vaginitis (gardnerella): POSITIVE — AB
Candida Glabrata: NEGATIVE
Candida Vaginitis: POSITIVE — AB
Comment: NEGATIVE
Comment: NEGATIVE
Comment: NEGATIVE

## 2020-08-02 LAB — HCG, QUANTITATIVE, PREGNANCY: hCG, Beta Chain, Quant, S: 51424 m[IU]/mL — ABNORMAL HIGH (ref <5)

## 2020-08-02 LAB — COMPREHENSIVE METABOLIC PANEL
ALT: 12 U/L (ref 0–44)
AST: 16 U/L (ref 15–41)
Albumin: 3.2 g/dL — ABNORMAL LOW (ref 3.5–5.0)
Alkaline Phosphatase: 53 U/L (ref 38–126)
Anion gap: 11 (ref 5–15)
BUN: <5 mg/dL — ABNORMAL LOW (ref 6–20)
CO2: 23 mmol/L (ref 22–32)
Calcium: 8.5 mg/dL — ABNORMAL LOW (ref 8.9–10.3)
Chloride: 105 mmol/L (ref 98–111)
Creatinine, Ser: <0.3 mg/dL — ABNORMAL LOW (ref 0.44–1.00)
Glucose, Bld: 75 mg/dL (ref 70–99)
Potassium: 3.8 mmol/L (ref 3.5–5.1)
Sodium: 139 mmol/L (ref 135–145)
Total Bilirubin: 0.5 mg/dL (ref 0.3–1.2)
Total Protein: 6.7 g/dL (ref 6.5–8.1)

## 2020-08-02 LAB — CBC WITH DIFFERENTIAL/PLATELET
Abs Immature Granulocytes: 0.07 10*3/uL (ref 0.00–0.07)
Basophils Absolute: 0 10*3/uL (ref 0.0–0.1)
Basophils Relative: 0 %
Eosinophils Absolute: 0.2 10*3/uL (ref 0.0–0.5)
Eosinophils Relative: 2 %
HCT: 33.4 % — ABNORMAL LOW (ref 36.0–46.0)
Hemoglobin: 11.5 g/dL — ABNORMAL LOW (ref 12.0–15.0)
Immature Granulocytes: 1 %
Lymphocytes Relative: 23 %
Lymphs Abs: 2.2 10*3/uL (ref 0.7–4.0)
MCH: 31.3 pg (ref 26.0–34.0)
MCHC: 34.4 g/dL (ref 30.0–36.0)
MCV: 90.8 fL (ref 80.0–100.0)
Monocytes Absolute: 0.7 10*3/uL (ref 0.1–1.0)
Monocytes Relative: 7 %
Neutro Abs: 6.4 10*3/uL (ref 1.7–7.7)
Neutrophils Relative %: 67 %
Platelets: 191 10*3/uL (ref 150–400)
RBC: 3.68 MIL/uL — ABNORMAL LOW (ref 3.87–5.11)
RDW: 13.3 % (ref 11.5–15.5)
WBC: 9.5 10*3/uL (ref 4.0–10.5)
nRBC: 0 % (ref 0.0–0.2)

## 2020-08-02 MED ORDER — FLUCONAZOLE 150 MG PO TABS: 150.0000 mg | ORAL_TABLET | Freq: Once | ORAL | 0 refills | Status: AC

## 2020-08-02 MED ORDER — METRONIDAZOLE 500 MG PO TABS: 500.0000 mg | ORAL_TABLET | Freq: Two times a day (BID) | ORAL | 0 refills | Status: AC

## 2020-08-02 NOTE — ED Triage Notes (Signed)
Pt ambulatory to triage. Pt reports intermittent vag bleeding since yesterday  Pt has low abd cramping. Pt is approx [redacted] weeks pregnant.  Pt treated for uti now.  Pt alert.

## 2020-08-02 NOTE — Progress Notes (Signed)
Swab positive for BV and yeast, orders sent for treatment.   Doreene Burke, CNM

## 2020-08-02 NOTE — Telephone Encounter (Signed)
Patient informed of test results. Also informed of medicine waiting for pickup at preferred pharmacy. Patient expressed understanding.

## 2020-08-03 ENCOUNTER — Telehealth: Payer: Self-pay

## 2020-08-03 MED ORDER — NITROFURANTOIN MONOHYD MACRO 100 MG PO CAPS: 100.0000 mg | ORAL_CAPSULE | Freq: Two times a day (BID) | ORAL | 0 refills | Status: DC

## 2020-08-03 MED ORDER — METRONIDAZOLE 500 MG PO TABS
500.0000 mg | ORAL_TABLET | Freq: Once | ORAL | Status: AC
  Administered 2020-08-03: 500 mg via ORAL

## 2020-08-03 MED ORDER — NITROFURANTOIN MONOHYD MACRO 100 MG PO CAPS
100.0000 mg | ORAL_CAPSULE | Freq: Once | ORAL | Status: DC
  Filled 2020-08-03: qty 1

## 2020-08-03 NOTE — Discharge Instructions (Addendum)
Call your OB today to let them know you were seen in the ED for bleeding. Your doctor sent you prescription for the treatment of the yeast infection and bacterial vaginosis. Make sure to get it at the pharmacy. Rest, do not lift more than 5 pounds, and avoid sex until cleared by your doctor to minimize the chances of a miscarriage.

## 2020-08-03 NOTE — ED Provider Notes (Signed)
[facility] Emergency Department Provider Note  ____________________________________________  Time seen: Approximately 12:57 AM  I have reviewed the triage vital signs and the nursing notes.   HISTORY  Chief Complaint Vaginal Bleeding   HPI Cheryl Wong is a 24 y.o. female G2P1 at [redacted] weeks gestational age who presents for evaluation of vaginal bleeding.  Patient went to see her OB yesterday for vaginal irritation, itching, and dysuria.  She had swabs done but does not know the results of them.  Today she started noticing some bleeding when she wipes.  If she is not clear if the bleeding is coming from her vagina or her urethra.  She denies any cramping or abdominal pain, she denies any large blood clots.  She has had her full prenatal exam including Pap smear and STD screening.  She denies any complications with her first pregnancy.  She reports that the bleeding is minimal with some spotting.  She continues to endorse dysuria.   Past Medical History:  Diagnosis Date  . Acute cholecystitis 10/04/2016  . Asthma   . Biliary colic   . Dyshidrotic eczema   . Psoriasis   . RUQ pain     Patient Active Problem List   Diagnosis Date Noted  . Vaginitis in pregnancy 07/06/2020  . Type O blood, Rh positive 07/06/2020  . Late prenatal care 07/03/2020  . Postpartum hemorrhage 11/21/2016  . Acute blood loss anemia 11/21/2016  . Acute cholecystitis 10/04/2016  . RUQ pain   . Biliary colic     No past surgical history on file.  Prior to Admission medications   Medication Sig Start Date End Date Taking? Authorizing Provider  azelaic acid (AZELEX) 20 % cream Apply topically. 08/29/16   [provider]  metroNIDAZOLE (FLAGYL) 500 MG tablet Take 1 tablet (500 mg total) by mouth 2 (two) times daily for 7 days. 08/02/20 08/09/20  Doreene Burke, CNM  Norgestimate-Ethinyl Estradiol Triphasic 0.18/0.215/0.25 MG-25 MCG tab Take 1 tablet by mouth daily for 28  days. 11/03/19 12/01/19  Sciora, Austin Miles, CNM  Prenatal Vit-Fe Fumarate-FA (MULTIVITAMIN-PRENATAL) 27-0.8 MG TABS tablet Take 1 tablet by mouth daily at 12 noon. 08/01/20   Doreene Burke, CNM  PROAIR HFA 108 [phone] Base) MCG/ACT inhaler Inhale into the lungs. 05/02/20   [provider]  terconazole (TERAZOL 7) 0.4 % vaginal cream Place 1 applicator vaginally at bedtime. 07/06/20   Lawhorn, Vanessa [place] Hughes, CNM  triamcinolone (KENALOG) 0.025 % cream Apply topically.    [provider]    Allergies Patient has no known allergies.  Family History  Problem Relation Age of Onset  . Diabetes Mother   . Hypertension Father     Social History Social History   Tobacco Use  . Smoking status: Never Smoker  . Smokeless tobacco: Never Used  Vaping Use  . Vaping Use: Never used  Substance Use Topics  . Alcohol use: No  . Drug use: No    Review of Systems  Constitutional: Negative for fever. Eyes: Negative for visual changes. ENT: Negative for sore throat. Neck: No neck pain  Cardiovascular: Negative for chest pain. Respiratory: Negative for shortness of breath. Gastrointestinal: Negative for abdominal pain, vomiting or diarrhea. Genitourinary: + dysuria and vaginal bleeding Musculoskeletal: Negative for back pain. Skin: Negative for rash. Neurological: Negative for headaches, weakness or numbness. Psych: No SI or HI  ____________________________________________   PHYSICAL EXAM:  VITAL SIGNS: ED Triage Vitals  Enc Vitals Group     BP  08/02/20 2025 (!) 131/56     Pulse Rate 08/02/20 2025 92     Resp 08/02/20 2025 18     Temp 08/02/20 2025 98.6 F (37 C)     Temp Source 08/02/20 2025 Oral     SpO2 08/02/20 2025 100 %     Weight 08/02/20 2026 149 lb (67.6 kg)     Height 08/02/20 2026 5\' 2"  (1.575 m)     Head Circumference --      Peak Flow --      Pain Score 08/02/20 2026 6     Pain Loc --      Pain Edu? --      Excl. in GC? --     Constitutional:  Alert and oriented. Well appearing and in no apparent distress. HEENT:      Head: Normocephalic and atraumatic.         Eyes: Conjunctivae are normal. Sclera is non-icteric.       Mouth/Throat: Mucous membranes are moist.       Neck: Supple with no signs of meningismus. Cardiovascular: Regular rate and rhythm. No murmurs, gallops, or rubs.  Respiratory: Normal respiratory effort.  Gastrointestinal: Soft, non tender, and non distended. Musculoskeletal: No edema, cyanosis, or erythema of extremities. Neurologic: Normal speech and language. Face is symmetric. Moving all extremities. No gross focal neurologic deficits are appreciated. Skin: Skin is warm, dry and intact. No rash noted. Psychiatric: Mood and affect are normal. Speech and behavior are normal.  ____________________________________________   LABS (all labs ordered are listed, but only abnormal results are displayed)  Labs Reviewed  CBC WITH DIFFERENTIAL/PLATELET - Abnormal; Notable for the following components:      Result Value   RBC 3.68 (*)    Hemoglobin 11.5 (*)    HCT 33.4 (*)    All other components within normal limits  COMPREHENSIVE METABOLIC PANEL - Abnormal; Notable for the following components:   BUN <5 (*)    Creatinine, Ser <0.30 (*)    Calcium 8.5 (*)    Albumin 3.2 (*)    All other components within normal limits  HCG, QUANTITATIVE, PREGNANCY - Abnormal; Notable for the following components:   hCG, Beta Chain, Quant, S 51,424 (*)    All other components within normal limits  URINALYSIS, COMPLETE (UACMP) WITH MICROSCOPIC - Abnormal; Notable for the following components:   Color, Urine YELLOW (*)    APPearance CLOUDY (*)    pH 9.0 (*)    Hgb urine dipstick MODERATE (*)    Leukocytes,Ua LARGE (*)    Bacteria, UA RARE (*)    All other components within normal limits  URINE CULTURE   ____________________________________________  EKG  none   ____________________________________________  RADIOLOGY  I have personally reviewed the images performed during this visit and I agree with the Radiologist's read.   Interpretation by Radiologist:  2027 OB Limited  Result Date: 08/03/2020 CLINICAL DATA:  Bleeding EXAM: LIMITED OBSTETRIC ULTRASOUND FINDINGS: Number of Fetuses: 1 Heart Rate:  147 bpm Movement: Yes Presentation: Cephalic Placental Location: Anterior Previa: No.  Low lying placenta 2 cm from the os Amniotic Fluid (Subjective):  Within normal limits. AFI:  cm BPD: 4.9 cm 20 w  6 d MATERNAL FINDINGS: Cervix:  Appears closed. Uterus/Adnexae: No abnormality visualized. IMPRESSION: Single intrauterine pregnancy, fetal heart rate 147 beats per minute. No acute maternal findings. Low lying placenta. This exam is performed on an emergent basis and does not comprehensively evaluate fetal size, dating, or anatomy; follow-up  complete OB US should be considered if further fetal assessment is warranted. Electronically Signed   By: Charlett Nose M.D.   On: 08/03/2020 01:16      ____________________________________________   PROCEDURES  Procedure(s) performed: None Procedures Critical Care performed:  None ____________________________________________   INITIAL IMPRESSION / ASSESSMENT AND PLAN / ED COURSE   24 y.o. female G2P1 at [redacted] weeks gestational age who presents for evaluation of vaginal bleeding.  Report received from radiology via paper since our system is down.  I am able to visualize patient's ultrasound which looks like a normal IUP with normal fetal heart rate, that is confirmed by radiology who also says patient has a low-lying placenta about 2 cm from the os.  Review of patient's old medical record shows that this is known to her.  I was also able to review the results of the swabs done by her OB yesterday.  They were positive for an yeast infection and bacterial vaginosis.  We will be has already sent prescriptions to patient's  pharmacy.  I told that to the patient that she needs to fill the prescriptions in the morning.  Her blood type is O+ no indication for RhoGam.  Her UA shows rare bacteria and WBCs with leuks but no nitrites.  Most likely from yeast and bacterial vaginosis although a culture has been sent.  Patient was given the first dose of Flagyl here.  Recommended close follow-up with her OB.  Discussed again pelvic rest.  History is gathered from patient and her sister who was at bedside.  Plan discussed with both of them.      _____________________________________________ Please note:  Patient was evaluated in Emergency Department today for the symptoms described in the history of present illness. Patient was evaluated in the context of the global COVID-19 pandemic, which necessitated consideration that the patient might be at risk for infection with the SARS-CoV-2 virus that causes COVID-19. Institutional protocols and algorithms that pertain to the evaluation of patients at risk for COVID-19 are in a state of rapid change based on information released by regulatory bodies including the CDC and federal and state organizations. These policies and algorithms were followed during the patient's care in the ED.  Some ED evaluations and interventions may be delayed as a result of limited staffing during the pandemic.   [place] Controlled Substance Database was reviewed by me. ____________________________________________   FINAL CLINICAL IMPRESSION(S) / ED DIAGNOSES   Final diagnoses:  Threatened miscarriage  Bacterial vaginitis  Candidiasis of vagina      NEW MEDICATIONS STARTED DURING THIS VISIT:  ED Discharge Orders         Ordered    nitrofurantoin, macrocrystal-monohydrate, (MACROBID) 100 MG capsule  2 times daily,   Status:  Discontinued        08/03/20 0055           Note:  This document was prepared using Dragon voice recognition software and may include unintentional dictation errors.     Don Perking, [place], MD 08/03/20 0120

## 2020-08-04 ENCOUNTER — Encounter: Payer: Self-pay | Admitting: Certified Nurse Midwife

## 2020-08-04 ENCOUNTER — Ambulatory Visit (INDEPENDENT_AMBULATORY_CARE_PROVIDER_SITE_OTHER): Payer: Medicaid Other | Admitting: Certified Nurse Midwife

## 2020-08-04 ENCOUNTER — Other Ambulatory Visit: Payer: Self-pay

## 2020-08-04 VITALS — BP 105/68 | HR 101 | Wt 150.4 lb

## 2020-08-04 DIAGNOSIS — O23599 Infection of other part of genital tract in pregnancy, unspecified trimester: Secondary | ICD-10-CM

## 2020-08-04 DIAGNOSIS — O99891 Other specified diseases and conditions complicating pregnancy: Secondary | ICD-10-CM | POA: Diagnosis not present

## 2020-08-04 DIAGNOSIS — Z3482 Encounter for supervision of other normal pregnancy, second trimester: Secondary | ICD-10-CM

## 2020-08-04 DIAGNOSIS — Z3A19 19 weeks gestation of pregnancy: Secondary | ICD-10-CM | POA: Diagnosis not present

## 2020-08-04 DIAGNOSIS — R102 Pelvic and perineal pain: Secondary | ICD-10-CM

## 2020-08-04 DIAGNOSIS — O26899 Other specified pregnancy related conditions, unspecified trimester: Secondary | ICD-10-CM | POA: Diagnosis not present

## 2020-08-04 DIAGNOSIS — O4442 Low lying placenta NOS or without hemorrhage, second trimester: Secondary | ICD-10-CM

## 2020-08-04 DIAGNOSIS — O26892 Other specified pregnancy related conditions, second trimester: Secondary | ICD-10-CM

## 2020-08-04 DIAGNOSIS — O4440 Low lying placenta NOS or without hemorrhage, unspecified trimester: Secondary | ICD-10-CM | POA: Diagnosis not present

## 2020-08-04 DIAGNOSIS — N760 Acute vaginitis: Secondary | ICD-10-CM

## 2020-08-04 DIAGNOSIS — O23592 Infection of other part of genital tract in pregnancy, second trimester: Secondary | ICD-10-CM | POA: Diagnosis not present

## 2020-08-04 DIAGNOSIS — O4452 Low lying placenta with hemorrhage, second trimester: Secondary | ICD-10-CM | POA: Insufficient documentation

## 2020-08-04 DIAGNOSIS — M549 Dorsalgia, unspecified: Secondary | ICD-10-CM

## 2020-08-04 LAB — POCT URINALYSIS DIPSTICK OB
Bilirubin, UA: NEGATIVE
Glucose, UA: NEGATIVE
Ketones, UA: NEGATIVE
Nitrite, UA: NEGATIVE
Spec Grav, UA: 1.015 (ref 1.010–1.025)
Urobilinogen, UA: 0.2 E.U./dL
pH, UA: 7 (ref 5.0–8.0)

## 2020-08-04 LAB — URINE CULTURE

## 2020-08-04 LAB — CULTURE, OB URINE

## 2020-08-04 LAB — URINE CULTURE, OB REFLEX

## 2020-08-04 MED ORDER — CYCLOBENZAPRINE HCL 5 MG PO TABS: 5.0000 mg | ORAL_TABLET | Freq: Three times a day (TID) | ORAL | 0 refills | Status: DC | PRN

## 2020-08-04 MED ORDER — TERCONAZOLE 0.4 % VA CREA: 1.0000 | TOPICAL_CREAM | Freq: Every day | VAGINAL | 0 refills | Status: DC

## 2020-08-04 NOTE — Progress Notes (Signed)
Subjective:   Cheryl Wong is a 24 y.o. G2P1001 [redacted]w[redacted]d being seen today for ER follow up visit visit.   Patient was seen in the ER for evaluation of vaginal bleeding on 08/02/2020; for further details, please see note.   Last prenatal visit was 08/01/2020 and patient was again diagnosed with vaginitis.    Denies contractions, vaginal bleeding or leaking of fluid.  Reports good fetal movement.  The following portions of the patient's history were reviewed and updated as appropriate: allergies, current medications, past family history, past medical history, past social history, past surgical history and problem list.   Review of Systems:  ROS negative except as noted above. Information obtained from patient.   Objective:   BP 105/68   Pulse (!) 101   Wt 150 lb 6.4 oz (68.2 kg)   LMP 03/23/2020   BMI 27.51 kg/m   FHT: Fetal Heart Rate (bpm): 135  Uterine Size: Fundal Height: 20 cm  Fetal Movement: Movement: Present    Abdomen:  soft, gravid, appropriate for gestational age,non-tender   Results for orders placed or performed in visit on 08/04/20 (from the past 24 hour(s))  POC Urinalysis Dipstick OB     Status: Abnormal   Collection Time: 08/04/20 10:19 AM  Result Value Ref Range   Color, UA yellow    Clarity, UA cloudy    Glucose, UA Negative Negative   Bilirubin, UA neg    Ketones, UA neg    Spec Grav, UA 1.015 1.010 - 1.025   Blood, UA medium ++    pH, UA 7.0 5.0 - 8.0   POC,PROTEIN,UA Trace Negative, Trace, Small (1+), Moderate (2+), Large (3+), 4+   Urobilinogen, UA 0.2 0.2 or 1.0 E.U./dL   Nitrite, UA neg    Leukocytes, UA Large (3+) (A) Negative   Appearance     Odor      Assessment:   Pregnancy:  G2P1001 at [redacted]w[redacted]d  1. [redacted] weeks gestation of pregnancy  - POC Urinalysis Dipstick OB  2. Encounter for supervision of other normal pregnancy in second trimester   3. Low-lying placenta   4. Vaginitis complicating current pregnancy   5. Back pain  affecting pregnancy in second trimester   6. Pain of round ligament during pregnancy   Plan:   Vaginal exam declined by patient.   Encouraged patient to take all medication as prescribed.   Education regarding low lying placenta and pelvic rest provided at length in detail.  Rx Terazol, see orders.   Preterm labor symptoms: vaginal bleeding, contractions and leaking of fluid reviewed in detail.  Fetal movement precautions reviewed.  Reviewed red flag symptoms and when to call.   Follow up as previously scheduled or sooner if needed.   [name], CNM [facility] 08/04/20 10:36 AM

## 2020-08-04 NOTE — Patient Instructions (Addendum)
Round Ligament Pain  The round ligament is a cord of muscle and tissue that helps support the uterus. It can become a source of pain during pregnancy if it becomes stretched or twisted as the baby grows. The pain usually begins in the second trimester (13-28 weeks) of pregnancy, and it can come and go until the baby is delivered. It is not a serious problem, and it does not cause harm to the baby. Round ligament pain is usually a short, sharp, and pinching pain, but it can also be a dull, lingering, and aching pain. The pain is felt in the lower side of the abdomen or in the groin. It usually starts deep in the groin and moves up to the outside of the hip area. The pain may occur when you:  Suddenly change position, such as quickly going from a sitting to standing position.  Roll over in bed.  Cough or sneeze.  Do physical activity. Follow these instructions at home:   Watch your condition for any changes.  When the pain starts, relax. Then try any of these methods to help with the pain: ? Sitting down. ? Flexing your knees up to your abdomen. ? Lying on your side with one pillow under your abdomen and another pillow between your legs. ? Sitting in a warm bath for 15-20 minutes or until the pain goes away.  Take over-the-counter and prescription medicines only as told by your health care provider.  Move slowly when you sit down or stand up.  Avoid long walks if they cause pain.  Stop or reduce your physical activities if they cause pain.  Keep all follow-up visits as told by your health care provider. This is important. Contact a health care provider if:  Your pain does not go away with treatment.  You feel pain in your back that you did not have before.  Your medicine is not helping. Get help right away if:  You have a fever or chills.  You develop uterine contractions.  You have vaginal bleeding.  You have nausea or vomiting.  You have diarrhea.  You have pain  when you urinate. Summary  Round ligament pain is felt in the lower abdomen or groin. It is usually a short, sharp, and pinching pain. It can also be a dull, lingering, and aching pain.  This pain usually begins in the second trimester (13-28 weeks). It occurs because the uterus is stretching with the growing baby, and it is not harmful to the baby.  You may notice the pain when you suddenly change position, when you cough or sneeze, or during physical activity.  Relaxing, flexing your knees to your abdomen, lying on one side, or taking a warm bath may help to get rid of the pain.  Get help from your health care provider if the pain does not go away or if you have vaginal bleeding, nausea, vomiting, diarrhea, or painful urination. This information is not intended to replace advice given to you by your health care provider. Make sure you discuss any questions you have with your health care provider. Document Revised: 02/18/2018 Document Reviewed: 02/18/2018 Elsevier Patient Education  [place].   Back Pain in Pregnancy Back pain during pregnancy is common. Back pain may be caused by several factors that are related to changes during your pregnancy. Follow these instructions at home: Managing pain, stiffness, and swelling      If directed, for sudden (acute) back pain, put ice on the painful area. ?  Put ice in a plastic bag. ? Place a towel between your skin and the bag. ? Leave the ice on for 20 minutes, 2-3 times per day.  If directed, apply heat to the affected area before you exercise. Use the heat source that your health care provider recommends, such as a moist heat pack or a heating pad. ? Place a towel between your skin and the heat source. ? Leave the heat on for 20-30 minutes. ? Remove the heat if your skin turns bright red. This is especially important if you are unable to feel pain, heat, or cold. You may have a greater risk of getting burned.  If directed,  massage the affected area. Activity  Exercise as told by your health care provider. Gentle exercise is the best way to prevent or manage back pain.  Listen to your body when lifting. If lifting hurts, ask for help or bend your knees. This uses your leg muscles instead of your back muscles.  Squat down when picking up something from the floor. Do not bend over.  Only use bed rest for short periods as told by your health care provider. Bed rest should only be used for the most severe episodes of back pain. Standing, sitting, and lying down  Do not stand in one place for long periods of time.  Use good posture when sitting. Make sure your head rests over your shoulders and is not hanging forward. Use a pillow on your lower back if necessary.  Try sleeping on your side, preferably the left side, with a pregnancy support pillow or 1-2 regular pillows between your legs. ? If you have back pain after a night's rest, your bed may be too soft. ? A firm mattress may provide more support for your back during pregnancy. General instructions  Do not wear high heels.  Eat a healthy diet. Try to gain weight within your health care provider's recommendations.  Use a maternity girdle, elastic sling, or back brace as told by your health care provider.  Take over-the-counter and prescription medicines only as told by your health care provider.  Work with a physical therapist or massage therapist to find ways to manage back pain. Acupuncture or massage therapy may be helpful.  Keep all follow-up visits as told by your health care provider. This is important. Contact a health care provider if:  Your back pain interferes with your daily activities.  You have increasing pain in other parts of your body. Get help right away if:  You develop numbness, tingling, weakness, or problems with the use of your arms or legs.  You develop severe back pain that is not controlled with medicine.  You have a  change in bowel or bladder control.  You develop shortness of breath, dizziness, or you faint.  You develop nausea, vomiting, or sweating.  You have back pain that is a rhythmic, cramping pain similar to labor pains. Labor pain is usually 1-2 minutes apart, lasts for about 1 minute, and involves a bearing down feeling or pressure in your pelvis.  You have back pain and your water breaks or you have vaginal bleeding.  You have back pain or numbness that travels down your leg.  Your back pain developed after you fell.  You develop pain on one side of your back.  You see blood in your urine.  You develop skin blisters in the area of your back pain. Summary  Back pain may be caused by several factors that are  related to changes during your pregnancy.  Follow instructions as told by your health care provider for managing pain, stiffness, and swelling.  Exercise as told by your health care provider. Gentle exercise is the best way to prevent or manage back pain.  Take over-the-counter and prescription medicines only as told by your health care provider.  Keep all follow-up visits as told by your health care provider. This is important. This information is not intended to replace advice given to you by your health care provider. Make sure you discuss any questions you have with your health care provider. Document Revised: 12/22/2018 Document Reviewed: 02/18/2018 Elsevier Patient Education  2020 [employer]   Placenta Previa Placenta previa is a condition in which the placenta implants in the lower part of the uterus in pregnant women. The placenta either partially or completely covers the opening to the cervix. This is a problem because the baby must pass through the cervix during delivery. There are three types of placenta previa:  Marginal placenta previa. The placenta reaches within an inch (2.5 cm) of the cervical opening but does not cover it.  Partial placenta previa. The  placenta covers part of the cervical opening.  Complete placenta previa. The placenta covers the entire cervical opening. If the previa is marginal or partial and it is diagnosed in the first half of pregnancy, the placenta may move into a normal position as the pregnancy progresses and may no longer cover the cervix. It is important to keep all prenatal visits with your health care provider so you can be more closely monitored. What are the causes? The cause of this condition is not known. What increases the risk? This condition is more likely to develop in women who:  Are carrying more than one baby (multiples).  Have an abnormally shaped uterus.  Have scars on the lining of the uterus.  Have had surgeries involving the uterus, such as a cesarean delivery.  Have delivered a baby before.  Have a history of placenta previa.  Have smoked or used cocaine during pregnancy.  Are age 73 or older during pregnancy. What are the signs or symptoms? The main symptom of this condition is sudden, painless vaginal bleeding during the second half of pregnancy. The amount of bleeding can be very light at first, and it usually stops on its own. Heavier bleeding episodes may also happen. Some women with placenta previa may have no bleeding at all. How is this diagnosed?  This condition is diagnosed: ? From an ultrasound. This test uses sound waves to find where the placenta is located before you have any bleeding episodes. ? During a checkup after vaginal bleeding is noticed.  If you are diagnosed with a partial or complete previa, digital exams with fingers will generally be avoided. Your health care provider will still perform a speculum exam.  If you did not have an ultrasound during your pregnancy, placenta previa may not be diagnosed until bleeding occurs during labor. How is this treated? Treatment for this condition may include:  Decreased activity.  Bed rest at home or in the  hospital.  Pelvic rest. Nothing is placed inside the vagina during pelvic rest. This means not having sex and not using tampons or douches.  A blood transfusion to replace blood that you have lost (maternal blood loss).  A cesarean delivery. This may be performed if: ? The bleeding is heavy and cannot be controlled. ? The placenta completely covers the cervix.  Medicines to stop premature  labor or to help the baby's lungs to mature. This treatment may be used if you need delivery before your pregnancy is full-term. Your treatment will be decided based on:  How much you are bleeding, or whether the bleeding has stopped.  How far along you are in your pregnancy.  The condition of your baby.  The type of placenta previa that you have.  Follow these instructions at home:  Get plenty of rest and lessen activity as told by your health care provider.  Stay on bed rest for as long as told by your health care provider.  Do not have sex, use tampons, use a douche, or place anything inside of your vagina if your health care provider recommended pelvic rest.  Take over-the-counter and prescription medicines as told by your health care provider.  Keep all follow-up visits as told by your health care provider. This is important. Get help right away if:  You have vaginal bleeding, even if in small amounts and even if you have no pain.  You have cramping or regular contractions.  You have pain in your abdomen or your lower back.  You have a feeling of increased pressure in your pelvis.  You have increased watery or bloody mucus from the vagina. This information is not intended to replace advice given to you by your health care provider. Make sure you discuss any questions you have with your health care provider. Document Revised: 08/15/2017 Document Reviewed: 03/16/2016 Elsevier Patient Education  2020 [employer]   Activity Restriction During Pregnancy Your health care provider  may recommend specific activity restrictions during pregnancy for a variety of reasons. Activity restriction may require that you limit activities that require great effort, such as exercise, lifting, or sex. The type of activity restriction will vary for each person, depending on your risk or the problems you are having. Activity restriction may be recommended for a period of time until your baby is delivered. Why are activity restrictions recommended? Activity restriction may be recommended if:  Your placenta is partially or completely covering the opening of your cervix (placenta previa).  There is bleeding between the wall of the uterus and the amniotic sac in the first trimester of pregnancy (subchorionic hemorrhage).  You went into labor too early (preterm labor).  You have a history of miscarriage.  You have a condition that causes high blood pressure during pregnancy (preeclampsia or eclampsia).  You are pregnant with more than one baby.  Your baby is not growing well. What are the risks? The risks depend on your specific restriction. Strict bed rest has the most physical and emotional risks and is no longer routinely recommended. Risks of strict bed rest include:  Loss of muscle conditioning from not moving.  Blood clots.  Social isolation.  Depression.  Loss of income. Talk with your health care team about activity restriction to decide if it is best for you and your baby. Even if you are having problems during your pregnancy, you may be able to continue with normal levels of activity with careful monitoring by your health care team. Follow these instructions at home: If needed, based on your overall health and the health of your baby, your health care provider will decide which type of activity restriction is right for you. Activity restrictions may include:  Not lifting anything heavier than 10 pounds (4.5 kg).  Avoiding activities that take a lot of physical  effort.  No lifting or straining.  Resting in a sitting position  or lying down for periods of time during the day. Pelvic rest may be recommended along with activity restrictions. If pelvic rest is recommended, then:  Do not have sex, an orgasm, or use sexual stimulation.  Do not use tampons. Do not douche. Do not put anything into your vagina.  Do not lift anything that is heavier than 10 lb (4.5 kg).  Avoid activities that require a lot of effort.  Avoid any activity in which your pelvic muscles could become strained, such as squatting. Questions to ask your health care provider  Why is my activity being limited?  How will activity restrictions affect my body?  Why is rest helpful for me and my baby?  What activities can I do?  When can I return to normal activities? When should I seek immediate medical care? Seek immediate medical care if you have:  Vaginal bleeding.  Vaginal discharge.  Cramping pain in your lower abdomen.  Regular contractions.  A low, dull backache. Summary  Your health care provider may recommend specific activity restrictions during pregnancy for a variety of reasons.  Activity restriction may require that you limit activities such as exercise, lifting, sex, or any other activity that requires great effort.  Discuss the risks and benefits of activity restriction with your health care team to decide if it is best for you and your baby.  Contact your health care provider right away if you think you are having contractions, or if you notice vaginal bleeding, discharge, or cramping. This information is not intended to replace advice given to you by your health care provider. Make sure you discuss any questions you have with your health care provider. Document Revised: 05/26/2019 Document Reviewed: 12/23/2017 Elsevier Patient Education  2020 [employer].

## 2020-08-06 ENCOUNTER — Other Ambulatory Visit: Payer: Self-pay | Admitting: Certified Nurse Midwife

## 2020-08-06 MED ORDER — NITROFURANTOIN MONOHYD MACRO 100 MG PO CAPS: 100.0000 mg | ORAL_CAPSULE | Freq: Two times a day (BID) | ORAL | 0 refills | Status: AC

## 2020-08-07 ENCOUNTER — Telehealth: Payer: Self-pay

## 2020-08-07 NOTE — Telephone Encounter (Signed)
Informed patient of urine culture results- UTI per Doreene Burke CNM. Advised patient antibiotic was sent to preferred pharmacy.

## 2020-08-29 NOTE — Telephone Encounter (Signed)
Error

## 2020-08-31 ENCOUNTER — Encounter: Payer: Self-pay | Admitting: Certified Nurse Midwife

## 2020-08-31 ENCOUNTER — Ambulatory Visit (INDEPENDENT_AMBULATORY_CARE_PROVIDER_SITE_OTHER): Payer: Medicaid Other | Admitting: Certified Nurse Midwife

## 2020-08-31 ENCOUNTER — Other Ambulatory Visit: Payer: Self-pay

## 2020-08-31 VITALS — BP 106/62 | HR 109 | Wt 154.0 lb

## 2020-08-31 DIAGNOSIS — O4440 Low lying placenta NOS or without hemorrhage, unspecified trimester: Secondary | ICD-10-CM

## 2020-08-31 DIAGNOSIS — Z3482 Encounter for supervision of other normal pregnancy, second trimester: Secondary | ICD-10-CM

## 2020-08-31 DIAGNOSIS — O23599 Infection of other part of genital tract in pregnancy, unspecified trimester: Secondary | ICD-10-CM

## 2020-08-31 DIAGNOSIS — O2342 Unspecified infection of urinary tract in pregnancy, second trimester: Secondary | ICD-10-CM

## 2020-08-31 DIAGNOSIS — Z3A23 23 weeks gestation of pregnancy: Secondary | ICD-10-CM

## 2020-08-31 DIAGNOSIS — N760 Acute vaginitis: Secondary | ICD-10-CM

## 2020-08-31 LAB — POCT URINALYSIS DIPSTICK OB
Bilirubin, UA: NEGATIVE
Blood, UA: NEGATIVE
Glucose, UA: NEGATIVE
Nitrite, UA: NEGATIVE
Spec Grav, UA: 1.02 (ref 1.010–1.025)
Urobilinogen, UA: 0.2 E.U./dL
pH, UA: 6.5 (ref 5.0–8.0)

## 2020-08-31 MED ORDER — METRONIDAZOLE 0.75 % VA GEL: 1.0000 | Freq: Every day | VAGINAL | 5 refills | Status: DC

## 2020-08-31 NOTE — Progress Notes (Signed)
ROB-Reports increased vaginal discharge. Discussed home treatment measures. Rx Metrogel, see orders. Anticipatory guidance regarding course of prenatal care. Encouraged use of abdominal support for round ligament and coccyx pain. Reviewed red flag symptoms and when to call. RTC x 4 weeks for 28 week labs, follow up ultrasound (low lying placenta) and ROB or sooner if needed.

## 2020-08-31 NOTE — Progress Notes (Signed)
Pt present for routine prenatal visit.  

## 2020-08-31 NOTE — Patient Instructions (Addendum)
WHAT OB PATIENTS CAN EXPECT   Confirmation of pregnancy and ultrasound ordered if medically indicated-[redacted] weeks gestation  New OB (NOB) intake with nurse and New OB (NOB) labs- [redacted] weeks gestation  New OB (NOB) physical examination with provider- 11/[redacted] weeks gestation  Flu vaccine-[redacted] weeks gestation  Anatomy scan-[redacted] weeks gestation  Glucose tolerance test, blood work to test for anemia, T-dap vaccine-[redacted] weeks gestation  Vaginal swabs/cultures-STD/Group B strep-[redacted] weeks gestation  Appointments every 4 weeks until 28 weeks  Every 2 weeks from 28 weeks until 36 weeks  Weekly visits from 36 weeks until delivery    Second Trimester of Pregnancy  The second trimester is from week 14 through week 27 (month 4 through 6). This is often the time in pregnancy that you feel your best. Often times, morning sickness has lessened or quit. You may have more energy, and you may get hungry more often. Your unborn baby is growing rapidly. At the end of the sixth month, he or she is about 9 inches long and weighs about 1 pounds. You will likely feel the baby move between 18 and 20 weeks of pregnancy. Follow these instructions at home: Medicines  Take over-the-counter and prescription medicines only as told by your doctor. Some medicines are safe and some medicines are not safe during pregnancy.  Take a prenatal vitamin that contains at least 600 micrograms (mcg) of folic acid.  If you have trouble pooping (constipation), take medicine that will make your stool soft (stool softener) if your doctor approves. Eating and drinking   Eat regular, healthy meals.  Avoid raw meat and uncooked cheese.  If you get low calcium from the food you eat, talk to your doctor about taking a daily calcium supplement.  Avoid foods that are high in fat and sugars, such as fried and sweet foods.  If you feel sick to your stomach (nauseous) or throw up (vomit): ? Eat 4 or 5 small meals a day instead of 3 large  meals. ? Try eating a few soda crackers. ? Drink liquids between meals instead of during meals.  To prevent constipation: ? Eat foods that are high in fiber, like fresh fruits and vegetables, whole grains, and beans. ? Drink enough fluids to keep your pee (urine) clear or pale yellow. Activity  Exercise only as told by your doctor. Stop exercising if you start to have cramps.  Do not exercise if it is too hot, too humid, or if you are in a place of great height (high altitude).  Avoid heavy lifting.  Wear low-heeled shoes. Sit and stand up straight.  You can continue to have sex unless your doctor tells you not to. Relieving pain and discomfort  Wear a good support bra if your breasts are tender.  Take warm water baths (sitz baths) to soothe pain or discomfort caused by hemorrhoids. Use hemorrhoid cream if your doctor approves.  Rest with your legs raised if you have leg cramps or low back pain.  If you develop puffy, bulging veins (varicose veins) in your legs: ? Wear support hose or compression stockings as told by your doctor. ? Raise (elevate) your feet for 15 minutes, 3-4 times a day. ? Limit salt in your food. Prenatal care  Write down your questions. Take them to your prenatal visits.  Keep all your prenatal visits as told by your doctor. This is important. Safety  Wear your seat belt when driving.  Make a list of emergency phone numbers, including numbers for family,  friends, the hospital, and police and fire departments. General instructions  Ask your doctor about the right foods to eat or for help finding a counselor, if you need these services.  Ask your doctor about local prenatal classes. Begin classes before month 6 of your pregnancy.  Do not use hot tubs, steam rooms, or saunas.  Do not douche or use tampons or scented sanitary pads.  Do not cross your legs for long periods of time.  Visit your dentist if you have not done so. Use a soft toothbrush  to brush your teeth. Floss gently.  Avoid all smoking, herbs, and alcohol. Avoid drugs that are not approved by your doctor.  Do not use any products that contain nicotine or tobacco, such as cigarettes and e-cigarettes. If you need help quitting, ask your doctor.  Avoid cat litter boxes and soil used by cats. These carry germs that can cause birth defects in the baby and can cause a loss of your baby (miscarriage) or stillbirth. Contact a doctor if:  You have mild cramps or pressure in your lower belly.  You have pain when you pee (urinate).  You have bad smelling fluid coming from your vagina.  You continue to feel sick to your stomach (nauseous), throw up (vomit), or have watery poop (diarrhea).  You have a nagging pain in your belly area.  You feel dizzy. Get help right away if:  You have a fever.  You are leaking fluid from your vagina.  You have spotting or bleeding from your vagina.  You have severe belly cramping or pain.  You lose or gain weight rapidly.  You have trouble catching your breath and have chest pain.  You notice sudden or extreme puffiness (swelling) of your face, hands, ankles, feet, or legs.  You have not felt the baby move in over an hour.  You have severe headaches that do not go away when you take medicine.  You have trouble seeing. Summary  The second trimester is from week 14 through week 27 (months 4 through 6). This is often the time in pregnancy that you feel your best.  To take care of yourself and your unborn baby, you will need to eat healthy meals, take medicines only if your doctor tells you to do so, and do activities that are safe for you and your baby.  Call your doctor if you get sick or if you notice anything unusual about your pregnancy. Also, call your doctor if you need help with the right food to eat, or if you want to know what activities are safe for you. This information is not intended to replace advice given to you by  your health care provider. Make sure you discuss any questions you have with your health care provider. Document Revised: 12/25/2018 Document Reviewed: 10/08/2016 Elsevier Patient Education  [place].   Back Pain in Pregnancy Back pain during pregnancy is common. Back pain may be caused by several factors that are related to changes during your pregnancy. Follow these instructions at home: Managing pain, stiffness, and swelling      If directed, for sudden (acute) back pain, put ice on the painful area. ? Put ice in a plastic bag. ? Place a towel between your skin and the bag. ? Leave the ice on for 20 minutes, 2-3 times per day.  If directed, apply heat to the affected area before you exercise. Use the heat source that your health care provider recommends, such as a  moist heat pack or a heating pad. ? Place a towel between your skin and the heat source. ? Leave the heat on for 20-30 minutes. ? Remove the heat if your skin turns bright red. This is especially important if you are unable to feel pain, heat, or cold. You may have a greater risk of getting burned.  If directed, massage the affected area. Activity  Exercise as told by your health care provider. Gentle exercise is the best way to prevent or manage back pain.  Listen to your body when lifting. If lifting hurts, ask for help or bend your knees. This uses your leg muscles instead of your back muscles.  Squat down when picking up something from the floor. Do not bend over.  Only use bed rest for short periods as told by your health care provider. Bed rest should only be used for the most severe episodes of back pain. Standing, sitting, and lying down  Do not stand in one place for long periods of time.  Use good posture when sitting. Make sure your head rests over your shoulders and is not hanging forward. Use a pillow on your lower back if necessary.  Try sleeping on your side, preferably the left side, with  a pregnancy support pillow or 1-2 regular pillows between your legs. ? If you have back pain after a night's rest, your bed may be too soft. ? A firm mattress may provide more support for your back during pregnancy. General instructions  Do not wear high heels.  Eat a healthy diet. Try to gain weight within your health care provider's recommendations.  Use a maternity girdle, elastic sling, or back brace as told by your health care provider.  Take over-the-counter and prescription medicines only as told by your health care provider.  Work with a physical therapist or massage therapist to find ways to manage back pain. Acupuncture or massage therapy may be helpful.  Keep all follow-up visits as told by your health care provider. This is important. Contact a health care provider if:  Your back pain interferes with your daily activities.  You have increasing pain in other parts of your body. Get help right away if:  You develop numbness, tingling, weakness, or problems with the use of your arms or legs.  You develop severe back pain that is not controlled with medicine.  You have a change in bowel or bladder control.  You develop shortness of breath, dizziness, or you faint.  You develop nausea, vomiting, or sweating.  You have back pain that is a rhythmic, cramping pain similar to labor pains. Labor pain is usually 1-2 minutes apart, lasts for about 1 minute, and involves a bearing down feeling or pressure in your pelvis.  You have back pain and your water breaks or you have vaginal bleeding.  You have back pain or numbness that travels down your leg.  Your back pain developed after you fell.  You develop pain on one side of your back.  You see blood in your urine.  You develop skin blisters in the area of your back pain. Summary  Back pain may be caused by several factors that are related to changes during your pregnancy.  Follow instructions as told by your health  care provider for managing pain, stiffness, and swelling.  Exercise as told by your health care provider. Gentle exercise is the best way to prevent or manage back pain.  Take over-the-counter and prescription medicines only as told by  your health care provider.  Keep all follow-up visits as told by your health care provider. This is important. This information is not intended to replace advice given to you by your health care provider. Make sure you discuss any questions you have with your health care provider. Document Revised: 12/22/2018 Document Reviewed: 02/18/2018 Elsevier Patient Education  2020 Else[employer] Inc.    Round Ligament Pain  The round ligament is a cord of muscle and tissue that helps support the uterus. It can become a source of pain during pregnancy if it becomes stretched or twisted as the baby grows. The pain usually begins in the second trimester (13-28 weeks) of pregnancy, and it can come and go until the baby is delivered. It is not a serious problem, and it does not cause harm to the baby. Round ligament pain is usually a short, sharp, and pinching pain, but it can also be a dull, lingering, and aching pain. The pain is felt in the lower side of the abdomen or in the groin. It usually starts deep in the groin and moves up to the outside of the hip area. The pain may occur when you:  Suddenly change position, such as quickly going from a sitting to standing position.  Roll over in bed.  Cough or sneeze.  Do physical activity. Follow these instructions at home:   Watch your condition for any changes.  When the pain starts, relax. Then try any of these methods to help with the pain: ? Sitting down. ? Flexing your knees up to your abdomen. ? Lying on your side with one pillow under your abdomen and another pillow between your legs. ? Sitting in a warm bath for 15-20 minutes or until the pain goes away.  Take over-the-counter and prescription medicines only as  told by your health care provider.  Move slowly when you sit down or stand up.  Avoid long walks if they cause pain.  Stop or reduce your physical activities if they cause pain.  Keep all follow-up visits as told by your health care provider. This is important. Contact a health care provider if:  Your pain does not go away with treatment.  You feel pain in your back that you did not have before.  Your medicine is not helping. Get help right away if:  You have a fever or chills.  You develop uterine contractions.  You have vaginal bleeding.  You have nausea or vomiting.  You have diarrhea.  You have pain when you urinate. Summary  Round ligament pain is felt in the lower abdomen or groin. It is usually a short, sharp, and pinching pain. It can also be a dull, lingering, and aching pain.  This pain usually begins in the second trimester (13-28 weeks). It occurs because the uterus is stretching with the growing baby, and it is not harmful to the baby.  You may notice the pain when you suddenly change position, when you cough or sneeze, or during physical activity.  Relaxing, flexing your knees to your abdomen, lying on one side, or taking a warm bath may help to get rid of the pain.  Get help from your health care provider if the pain does not go away or if you have vaginal bleeding, nausea, vomiting, diarrhea, or painful urination. This information is not intended to replace advice given to you by your health care provider. Make sure you discuss any questions you have with your health care provider. Document Revised: 02/18/2018 Document Reviewed:  02/18/2018 Elsevier Patient Education  2020 [employer].

## 2020-09-03 LAB — URINE CULTURE

## 2020-09-12 ENCOUNTER — Other Ambulatory Visit: Payer: Self-pay

## 2020-09-12 ENCOUNTER — Telehealth: Payer: Self-pay

## 2020-09-12 MED ORDER — TERCONAZOLE 0.4 % VA CREA: 1.0000 | TOPICAL_CREAM | Freq: Every day | VAGINAL | 0 refills | Status: DC

## 2020-09-12 NOTE — Telephone Encounter (Signed)
Pt c/o vaginal itching when not using Terazol.  Requested Terazol refill. Script sent (as requested). Pt verbalized understanding.

## 2020-09-12 NOTE — Telephone Encounter (Signed)
  Patient called in requesting a refill on her Terconazole. Informed patient to call her pharmacy, she states she already did and informed her to call us.  Could you please advise?

## 2020-09-16 NOTE — L&D Delivery Note (Signed)
Delivery Summary for Cheryl Wong  Labor Events:   Preterm labor: No data found  Rupture date: No data found  Rupture time: No data found  Rupture type: No data found  Fluid Color: No data found  Induction: No data found  Augmentation: No data found  Complications: No data found  Cervical ripening: No data found No data found   No data found     Delivery:   Episiotomy: No data found  Lacerations: No data found  Repair suture: No data found  Repair # of packets: No data found  Blood loss (ml): 1235   Information for the patient's newborn:  Shakirra, Buehler [[id number]]    Delivery 12/08/2020 8:35 AM by  C-Section, Low Transverse Sex:  female Gestational Age: [redacted]w[redacted]d Delivery Clinician:   Living?:         APGARS  One minute Five minutes Ten minutes  Skin color:        Heart rate:        Grimace:        Muscle tone:        Breathing:        Totals: 8  9      Presentation/position:      Resuscitation:   Cord information:    Disposition of cord blood:     Blood gases sent?  Complications:   Placenta: Delivered:       appearance Newborn Measurements: Weight: 9 lb 14.7 oz (4500 g)  Height: 21"  Head circumference:    Chest circumference:    Other providers:    Additional  information: Forceps:   Vacuum:   Breech:   Observed anomalies        See Dr. Oretha Milch operative note for details of C-section procedure.    Hildred Laser, MD [facility]

## 2020-10-03 ENCOUNTER — Other Ambulatory Visit: Payer: Medicaid Other

## 2020-10-03 ENCOUNTER — Encounter: Payer: Medicaid Other | Admitting: Certified Nurse Midwife

## 2020-10-09 ENCOUNTER — Other Ambulatory Visit: Payer: Self-pay

## 2020-10-09 DIAGNOSIS — Z3483 Encounter for supervision of other normal pregnancy, third trimester: Secondary | ICD-10-CM

## 2020-10-10 ENCOUNTER — Other Ambulatory Visit: Payer: Medicaid Other

## 2020-10-10 ENCOUNTER — Encounter: Payer: Self-pay | Admitting: Certified Nurse Midwife

## 2020-10-10 ENCOUNTER — Other Ambulatory Visit: Payer: Self-pay

## 2020-10-10 ENCOUNTER — Ambulatory Visit (INDEPENDENT_AMBULATORY_CARE_PROVIDER_SITE_OTHER): Payer: Medicaid Other | Admitting: Certified Nurse Midwife

## 2020-10-10 ENCOUNTER — Ambulatory Visit (INDEPENDENT_AMBULATORY_CARE_PROVIDER_SITE_OTHER): Payer: Medicaid Other

## 2020-10-10 VITALS — BP 123/66 | HR 99 | Wt 162.0 lb

## 2020-10-10 DIAGNOSIS — Z3A29 29 weeks gestation of pregnancy: Secondary | ICD-10-CM

## 2020-10-10 DIAGNOSIS — O4443 Low lying placenta NOS or without hemorrhage, third trimester: Secondary | ICD-10-CM

## 2020-10-10 DIAGNOSIS — O4440 Low lying placenta NOS or without hemorrhage, unspecified trimester: Secondary | ICD-10-CM | POA: Diagnosis not present

## 2020-10-10 DIAGNOSIS — Z3482 Encounter for supervision of other normal pregnancy, second trimester: Secondary | ICD-10-CM

## 2020-10-10 DIAGNOSIS — Z23 Encounter for immunization: Secondary | ICD-10-CM

## 2020-10-10 LAB — POCT URINALYSIS DIPSTICK OB
Bilirubin, UA: NEGATIVE
Blood, UA: NEGATIVE
Glucose, UA: NEGATIVE
Leukocytes, UA: NEGATIVE
Nitrite, UA: NEGATIVE
POC,PROTEIN,UA: NEGATIVE
Spec Grav, UA: 1.02 (ref 1.010–1.025)
Urobilinogen, UA: 0.2 E.U./dL
pH, UA: 6.5 (ref 5.0–8.0)

## 2020-10-10 MED ORDER — TETANUS-DIPHTH-ACELL PERTUSSIS 5-2.5-18.5 LF-MCG/0.5 IM SUSY
0.5000 mL | PREFILLED_SYRINGE | Freq: Once | INTRAMUSCULAR | Status: AC
  Administered 2020-10-10: 0.5 mL via INTRAMUSCULAR

## 2020-10-10 MED ORDER — TERCONAZOLE 0.4 % VA CREA: 1.0000 | TOPICAL_CREAM | Freq: Every day | VAGINAL | 0 refills | Status: DC

## 2020-10-10 NOTE — Progress Notes (Signed)
ROB doing well. Feels good fetal movement. 28 wk labs today, BTC/RPR/CBC/glucose/Tdap. RSB reviewed. See check list. Birth control after baby discussed. Sample birth plan given. Will follow up next appointment. U/s repeated today for low lying placenta (see below). Placenta is now 2.3 cm from os. Reviewed with pt. Pt complains of continuing to have vaginal irration and request refill on vaginal cream. Follow up 2 wk with Marcelino Duster .   Doreene Burke, CNM   Patient Name: OZETTA FLATLEY DOB: [birth year] MRN: [id number]   ULTRASOUND REPORT  Location: [facility] Date of Service: 10/10/2020   Indications: F/U Low Lying Placena  Findings:  Singleton intrauterine pregnancy is visualized.   FHR: 152 BPM Stomach:  visualized Kidneys: visualized Bladder: visualized  Long, closed cervix. The anterior placenta is 2.3 cm away from the internal cervical os.   Impression: 1. [redacted]w[redacted]d viable Singleton Intrauterine pregnancy by previously established criteria. 2. The anterior placenta is no longer low lying.   Recommendations: 1.Clinical correlation with the patient's History and Physical Exam. 2.   Deanna Artis, RT

## 2020-10-10 NOTE — Patient Instructions (Signed)
Oral Glucose Tolerance Test During Pregnancy Why am I having this test? The oral glucose tolerance test (OGTT) is done to check how your body processes blood sugar (glucose). This is one of several tests used to diagnose diabetes that develops during pregnancy (gestational diabetes mellitus). Gestational diabetes is a short-term form of diabetes that some women develop while they are pregnant. It usually occurs during the second trimester of pregnancy and goes away after delivery. Testing, or screening, for gestational diabetes usually occurs at weeks 24-28 of pregnancy. You may have the OGTT test after having a 1-hour glucose screening test if the results from that test indicate that you may have gestational diabetes. This test may also be needed if:  You have a history of gestational diabetes.  There is a history of giving birth to very large babies or of losing pregnancies (having stillbirths).  You have signs and symptoms of diabetes, such as: ? Changes in your eyesight. ? Tingling or numbness in your hands or feet. ? Changes in hunger, thirst, and urination, and these are not explained by your pregnancy. What is being tested? This test measures the amount of glucose in your blood at different times during a period of 3 hours. This shows how well your body can process glucose. What kind of sample is taken? Blood samples are required for this test. They are usually collected by inserting a needle into a blood vessel.   How do I prepare for this test?  For 3 days before your test, eat normally. Have plenty of carbohydrate-rich foods.  Follow instructions from your health care provider about: ? Eating or drinking restrictions on the day of the test. You may be asked not to eat or drink anything other than water (to fast) starting 8-10 hours before the test. ? Changing or stopping your regular medicines. Some medicines may interfere with this test. Tell a health care provider about:  All  medicines you are taking, including vitamins, herbs, eye drops, creams, and over-the-counter medicines.  Any blood disorders you have.  Any surgeries you have had.  Any medical conditions you have. What happens during the test? First, your blood glucose will be measured. This is referred to as your fasting blood glucose because you fasted before the test. Then, you will drink a glucose solution that contains a certain amount of glucose. Your blood glucose will be measured again 1, 2, and 3 hours after you drink the solution. This test takes about 3 hours to complete. You will need to stay at the testing location during this time. During the testing period:  Do not eat or drink anything other than the glucose solution.  Do not exercise.  Do not use any products that contain nicotine or tobacco, such as cigarettes, e-cigarettes, and chewing tobacco. These can affect your test results. If you need help quitting, ask your health care provider. The testing procedure may vary among health care providers and hospitals. How are the results reported? Your results will be reported as milligrams of glucose per deciliter of blood (mg/dL) or millimoles per liter (mmol/L). There is more than one source for screening and diagnosis reference values used to diagnose gestational diabetes. Your health care provider will compare your results to normal values that were established after testing a large group of people (reference values). Reference values may vary among labs and hospitals. For this test ([place]arp[place]-Coustan), reference values are:  Fasting: 95 mg/dL (5.3 mmol/L).  1 hour: 180 mg/dL (10.0 mmol/L).  2 hour:   155 mg/dL (8.6 mmol/L).  3 hour: 140 mg/dL (7.8 mmol/L). What do the results mean? Results below the reference values are considered normal. If two or more of your blood glucose levels are at or above the reference values, you may be diagnosed with gestational diabetes. If only one level is  high, your health care provider may suggest repeat testing or other tests to confirm a diagnosis. Talk with your health care provider about what your results mean. Questions to ask your health care provider Ask your health care provider, or the department that is doing the test:  When will my results be ready?  How will I get my results?  What are my treatment options?  What other tests do I need?  What are my next steps? Summary  The oral glucose tolerance test (OGTT) is one of several tests used to diagnose diabetes that develops during pregnancy (gestational diabetes mellitus). Gestational diabetes is a short-term form of diabetes that some women develop while they are pregnant.  You may have the OGTT test after having a 1-hour glucose screening test if the results from that test show that you may have gestational diabetes. You may also have this test if you have any symptoms or risk factors for this type of diabetes.  Talk with your health care provider about what your results mean. This information is not intended to replace advice given to you by your health care provider. Make sure you discuss any questions you have with your health care provider. Document Revised: 02/10/2020 Document Reviewed: 02/10/2020 Elsevier Patient Education  2021 Elsevier Inc.  

## 2020-10-11 LAB — VIRAL HEPATITIS HBV, HCV
HCV Ab: 0.1 s/co ratio (ref 0.0–0.9)
Hep B Core Total Ab: NEGATIVE
Hep B Surface Ab, Qual: REACTIVE
Hepatitis B Surface Ag: NEGATIVE

## 2020-10-11 LAB — CBC
Hematocrit: 33.6 % — ABNORMAL LOW (ref 34.0–46.6)
Hemoglobin: 11.6 g/dL (ref 11.1–15.9)
MCH: 29.7 pg (ref 26.6–33.0)
MCHC: 34.5 g/dL (ref 31.5–35.7)
MCV: 86 fL (ref 79–97)
Platelets: 188 10*3/uL (ref 150–450)
RBC: 3.91 x10E6/uL (ref 3.77–5.28)
RDW: 12.5 % (ref 11.7–15.4)
WBC: 8.2 10*3/uL (ref 3.4–10.8)

## 2020-10-11 LAB — RPR: RPR Ser Ql: NONREACTIVE

## 2020-10-11 LAB — GLUCOSE, 1 HOUR GESTATIONAL: Gestational Diabetes Screen: 166 mg/dL — ABNORMAL HIGH (ref 65–139)

## 2020-10-11 LAB — HCV INTERPRETATION

## 2020-10-12 ENCOUNTER — Other Ambulatory Visit: Payer: Self-pay

## 2020-10-12 ENCOUNTER — Other Ambulatory Visit: Payer: Self-pay | Admitting: Certified Nurse Midwife

## 2020-10-12 DIAGNOSIS — R7309 Other abnormal glucose: Secondary | ICD-10-CM

## 2020-10-12 DIAGNOSIS — Z131 Encounter for screening for diabetes mellitus: Secondary | ICD-10-CM

## 2020-10-30 ENCOUNTER — Ambulatory Visit (INDEPENDENT_AMBULATORY_CARE_PROVIDER_SITE_OTHER): Payer: Medicaid Other | Admitting: Certified Nurse Midwife

## 2020-10-30 ENCOUNTER — Encounter: Payer: Self-pay | Admitting: Certified Nurse Midwife

## 2020-10-30 ENCOUNTER — Other Ambulatory Visit: Payer: Self-pay

## 2020-10-30 ENCOUNTER — Other Ambulatory Visit: Payer: Medicaid Other

## 2020-10-30 VITALS — BP 102/71 | HR 103 | Wt 163.8 lb

## 2020-10-30 DIAGNOSIS — Z131 Encounter for screening for diabetes mellitus: Secondary | ICD-10-CM

## 2020-10-30 DIAGNOSIS — R21 Rash and other nonspecific skin eruption: Secondary | ICD-10-CM

## 2020-10-30 DIAGNOSIS — Z3403 Encounter for supervision of normal first pregnancy, third trimester: Secondary | ICD-10-CM

## 2020-10-30 DIAGNOSIS — R7309 Other abnormal glucose: Secondary | ICD-10-CM

## 2020-10-30 DIAGNOSIS — Z3A31 31 weeks gestation of pregnancy: Secondary | ICD-10-CM

## 2020-10-30 DIAGNOSIS — M549 Dorsalgia, unspecified: Secondary | ICD-10-CM

## 2020-10-30 DIAGNOSIS — O99891 Other specified diseases and conditions complicating pregnancy: Secondary | ICD-10-CM

## 2020-10-30 LAB — POCT URINALYSIS DIPSTICK OB
Bilirubin, UA: NEGATIVE
Blood, UA: NEGATIVE
Glucose, UA: NEGATIVE
Ketones, UA: NEGATIVE
Leukocytes, UA: NEGATIVE
Nitrite, UA: NEGATIVE
POC,PROTEIN,UA: NEGATIVE
Spec Grav, UA: 1.01 (ref 1.010–1.025)
Urobilinogen, UA: 0.2 E.U./dL
pH, UA: 7.5 (ref 5.0–8.0)

## 2020-10-30 MED ORDER — NYSTATIN-TRIAMCINOLONE 100000-0.1 UNIT/GM-% EX OINT: 1.0000 "application " | TOPICAL_OINTMENT | Freq: Two times a day (BID) | CUTANEOUS | 0 refills | Status: DC

## 2020-10-30 NOTE — Progress Notes (Signed)
ROB-Reports intermittent pelvic pain and back pain as well as rash to chest and shoulders. Discussed home treatment measures. Rx Mycolog, see orders. GTT today, see chart. Considering elective cesarean section due to Ramadan, reassurance provided. Anticipatory guidance regarding course of prenatal care.Reviewed Red flag symptoms and when to call. RTC x 2 weeks for ROB with ANNIE and x 4 weeks for ROB with JML or sooner if needed.  Juliann Pares, Student-[employer]ife [employer] 10/30/20 10:20 AM

## 2020-10-30 NOTE — Patient Instructions (Signed)
Back Pain in Pregnancy Back pain during pregnancy is common. Back pain may be caused by several factors that are related to changes during your pregnancy. Follow these instructions at home: Managing pain, stiffness, and swelling  If directed, for sudden (acute) back pain, put ice on the painful area. ? Put ice in a plastic bag. ? Place a towel between your skin and the bag. ? Leave the ice on for 20 minutes, 2-3 times per day.  If directed, apply heat to the affected area before you exercise. Use the heat source that your health care provider recommends, such as a moist heat pack or a heating pad. ? Place a towel between your skin and the heat source. ? Leave the heat on for 20-30 minutes. ? Remove the heat if your skin turns bright red. This is especially important if you are unable to feel pain, heat, or cold. You may have a greater risk of getting burned.  If directed, massage the affected area.      Activity  Exercise as told by your health care provider. Gentle exercise is the best way to prevent or manage back pain.  Listen to your body when lifting. If lifting hurts, ask for help or bend your knees. This uses your leg muscles instead of your back muscles.  Squat down when picking up something from the floor. Do not bend over.  Only use bed rest for short periods as told by your health care provider. Bed rest should only be used for the most severe episodes of back pain. Standing, sitting, and lying down  Do not stand in one place for long periods of time.  Use good posture when sitting. Make sure your head rests over your shoulders and is not hanging forward. Use a pillow on your lower back if necessary.  Try sleeping on your side, preferably the left side, with a pregnancy support pillow or 1-2 regular pillows between your legs. ? If you have back pain after a night's rest, your bed may be too soft. ? A firm mattress may provide more support for your back during  pregnancy. General instructions  Do not wear high heels.  Eat a healthy diet. Try to gain weight within your health care provider's recommendations.  Use a maternity girdle, elastic sling, or back brace as told by your health care provider.  Take over-the-counter and prescription medicines only as told by your health care provider.  Work with a physical therapist or massage therapist to find ways to manage back pain. Acupuncture or massage therapy may be helpful.  Keep all follow-up visits as told by your health care provider. This is important. Contact a health care provider if:  Your back pain interferes with your daily activities.  You have increasing pain in other parts of your body. Get help right away if:  You develop numbness, tingling, weakness, or problems with the use of your arms or legs.  You develop severe back pain that is not controlled with medicine.  You have a change in bowel or bladder control.  You develop shortness of breath, dizziness, or you faint.  You develop nausea, vomiting, or sweating.  You have back pain that is a rhythmic, cramping pain similar to labor pains. Labor pain is usually 1-2 minutes apart, lasts for about 1 minute, and involves a bearing down feeling or pressure in your pelvis.  You have back pain and your water breaks or you have vaginal bleeding.  You have back pain  or numbness that travels down your leg.  Your back pain developed after you fell.  You develop pain on one side of your back.  You see blood in your urine.  You develop skin blisters in the area of your back pain. Summary  Back pain may be caused by several factors that are related to changes during your pregnancy.  Follow instructions as told by your health care provider for managing pain, stiffness, and swelling.  Exercise as told by your health care provider. Gentle exercise is the best way to prevent or manage back pain.  Take over-the-counter and  prescription medicines only as told by your health care provider.  Keep all follow-up visits as told by your health care provider. This is important. This information is not intended to replace advice given to you by your health care provider. Make sure you discuss any questions you have with your health care provider. Document Revised: 12/22/2018 Document Reviewed: 02/18/2018 Elsevier Patient Education  2021 [employer].    Third Trimester of Pregnancy  The third trimester of pregnancy is from week 28 through week 40. This is also called months 7 through 9. This trimester is when your unborn baby (fetus) is growing very fast. At the end of the ninth month, the unborn baby is about 20 inches long. It weighs about 6-10 pounds. Body changes during your third trimester Your body continues to go through many changes during this time. The changes vary and generally return to normal after the baby is born. Physical changes  Your weight will continue to increase. You may gain 25-35 pounds (11-16 kg) by the end of the pregnancy. If you are underweight, you may gain 28-40 lb (about 13-18 kg). If you are overweight, you may gain 15-25 lb (about 7-11 kg).  You may start to get stretch marks on your hips, belly (abdomen), and breasts.  Your breasts will continue to grow and may hurt. A yellow fluid (colostrum) may leak from your breasts. This is the first milk you are making for your baby.  You may have changes in your hair.  Your belly button may stick out.  You may have more swelling in your hands, face, or ankles. Health changes  You may have heartburn.  You may have trouble pooping (constipation).  You may get hemorrhoids. These are swollen veins in the butt that can itch or get painful.  You may have swollen veins (varicose veins) in your legs.  You may have more body aches in the pelvis, back, or thighs.  You may have more tingling or numbness in your hands, arms, and legs. The  skin on your belly may also feel numb.  You may feel short of breath as your womb (uterus) gets bigger. Other changes  You may pee (urinate) more often.  You may have more problems sleeping.  You may notice the unborn baby "dropping," or moving lower in your belly.  You may have more discharge coming from your vagina.  Your joints may feel loose, and you may have pain around your pelvic bone. Follow these instructions at home: Medicines  Take over-the-counter and prescription medicines only as told by your doctor. Some medicines are not safe during pregnancy.  Take a prenatal vitamin that contains at least 600 micrograms (mcg) of folic acid. Eating and drinking  Eat healthy meals that include: ? Fresh fruits and vegetables. ? Whole grains. ? Good sources of protein, such as meat, eggs, or tofu. ? Low-fat dairy products.  Avoid raw meat and unpasteurized juice, milk, and cheese. These carry germs that can harm you and your baby.  Eat 4 or 5 small meals rather than 3 large meals a day.  You may need to take these actions to prevent or treat trouble pooping: ? Drink enough fluids to keep your pee (urine) pale yellow. ? Eat foods that are high in fiber. These include beans, whole grains, and fresh fruits and vegetables. ? Limit foods that are high in fat and sugar. These include fried or sweet foods. Activity  Exercise only as told by your doctor. Stop exercising if you start to have cramps in your womb.  Avoid heavy lifting.  Do not exercise if it is too hot or too humid, or if you are in a place of great height (high altitude).  If you choose to, you may have sex unless your doctor tells you not to. Relieving pain and discomfort  Take breaks often, and rest with your legs raised (elevated) if you have leg cramps or low back pain.  Take warm water baths (sitz baths) to soothe pain or discomfort caused by hemorrhoids. Use hemorrhoid cream if your doctor approves.  Wear  a good support bra if your breasts are tender.  If you develop bulging, swollen veins in your legs: ? Wear support hose as told by your doctor. ? Raise your feet for 15 minutes, 3-4 times a day. ? Limit salt in your food. Safety  Talk to your doctor before traveling far distances.  Do not use hot tubs, steam rooms, or saunas.  Wear your seat belt at all times when you are in a car.  Talk with your doctor if someone is hurting you or yelling at you a lot. Preparing for your baby's arrival To prepare for the arrival of your baby:  Take prenatal classes.  Visit the hospital and tour the maternity area.  Buy a rear-facing car seat. Learn how to install it in your car.  Prepare the baby's room. Take out all pillows and stuffed animals from the baby's crib. General instructions  Avoid cat litter boxes and soil used by cats. These carry germs that can cause harm to the baby and can cause a loss of your baby by miscarriage or stillbirth.  Do not douche or use tampons. Do not use scented sanitary pads.  Do not smoke or use any products that contain nicotine or tobacco. If you need help quitting, ask your doctor.  Do not drink alcohol.  Do not use herbal medicines, illegal drugs, or medicines that were not approved by your doctor. Chemicals in these products can affect your baby.  Keep all follow-up visits. This is important. Where to find more information  American Pregnancy Association: americanpregnancy.org  [employer]: www.acog.org  Office on Women's Health: [employer]co.nz Contact a doctor if:  You have a fever.  You have mild cramps or pressure in your lower belly.  You have a nagging pain in your belly area.  You vomit, or you have watery poop (diarrhea).  You have bad-smelling fluid coming from your vagina.  You have pain when you pee, or your pee smells bad.  You have a headache that does not go away when  you take medicine.  You have changes in how you see, or you see spots in front of your eyes. Get help right away if:  Your water breaks.  You have regular contractions that are less than 5 minutes apart.  You are spotting or bleeding from your vagina.  You have very bad belly cramps or pain.  You have trouble breathing.  You have chest pain.  You faint.  You have not felt the baby move for the amount of time told by your doctor.  You have new or increased pain, swelling, or redness in an arm or leg. Summary  The third trimester is from week 28 through week 40 (months 7 through 9). This is the time when your unborn baby is growing very fast.  During this time, your discomfort may increase as you gain weight and as your baby grows.  Get ready for your baby to arrive by taking prenatal classes, buying a rear-facing car seat, and preparing the baby's room.  Get help right away if you are bleeding from your vagina, you have chest pain and trouble breathing, or you have not felt the baby move for the amount of time told by your doctor. This information is not intended to replace advice given to you by your health care provider. Make sure you discuss any questions you have with your health care provider. Document Revised: 02/09/2020 Document Reviewed: 12/16/2019 Elsevier Patient Education  2021 Elsevier Inc.    Gerrard and Resnik's maternal-fetal medicine: Principles and practice (8th ed., pp. [phone]). Elsevier.">  Skin Conditions During Pregnancy Pregnancy affects many parts of the human body. One part is the skin. Most skin problems that develop during pregnancy are not serious and are considered a normal part of pregnancy. Many skin problems go away on their own after the baby is born. What type of skin problems can develop during pregnancy? Common skin conditions  Stretch marks. Stretch marks are purple or pink lines on the skin. They may appear on the belly, breasts,  thighs, or buttocks. Stretch marks are caused by weight gain, which causes the skin to stretch. Stretch marks do not cause problems. Almost all women get them during pregnancy. Most stretch marks fade after the baby is born but may not disappear completely.  Darkening of the skin, called hyperpigmentation. The darkening may occur in patches or as a line. Patches may appear on the face (melasma), nipples, or genital area. A dark line may also form and stretch from the belly button to the pubic area (linea nigra). Hyperpigmentation develops in almost all pregnant women. It is more severe in women with a dark complexion. Most hyperpigmentation fades after the baby is born.  Spider angiomas or spider veins. These are tiny pink or red lines that go out from a center point, like the legs of a spider. Usually, they are on the face, neck, and arms. They do not cause problems. They are most common in women with light complexions. Spider veins usually fade after the baby is born.  Varicose veins. Excess blood volume during pregnancy can cause veins to enlarge. They can look like swollen veins above the surface of the skin and are usually red or purple. They are usually found on the legs. They may also be found on the buttocks, where they are called hemorrhoids. In most cases, the varicose veins go away after the baby is born.  Pruritic urticarial papules and plaques of pregnancy (PUPPP rash). This is an itchy, red rash that has tiny bumps. The cause is unknown. It generally starts on the abdomen and may affect the arms or legs. It usually begins later in pregnancy. The rash is not known to affect the unborn baby. Sometimes, topical steroids are used to  soothe the itch. The rash clears after the baby is born. Uncommon skin conditions  Pemphigoid gestationis. This is a very rare autoimmune disease. It causes a severely itchy rash and blisters. It develops in the second or third trimester of pregnancy or immediately  after pregnancy (postpartum). The rash usually appears on the abdomen, buttocks, arms, and legs. It usually goes away within 3 months after delivery. It may return (recur) with future pregnancies. It has also been associated with an increased risk of Graves' disease. Sometimes the rash can increase risk of complications for the unborn baby. The baby may be born early, may be born with blisters, or may have low birth weight.  Pruritic folliculitis of pregnancy. This is a rare condition that causes pimple-like skin growths. It develops in the middle or later stages of pregnancy. The cause of this condition is not known. It usually goes away 2-3 weeks after delivery but can recur with future pregnancies. The rash does not affect the unborn baby.  Intrahepatic cholestasis of pregnancy. This is a rare liver condition that causes itchy skin, but not a rash. It usually occurs on the palms of the hands and soles of the feet but may spread to the abdomen. It usually starts in the third trimester and can increase the risk of complications for the unborn baby. This condition usually heals after delivery, but it can recur in future pregnancies. It may run in families (inherited).  Pustular psoriasis of pregnancy, also known as impetigo herpetiformis. This is a form of a severe skin disease. It causes many translucent, white bumps that may form a crust when they burst. It usually occurs in the third trimester. It may cause complications in the mother. It can also increase the risk of complications in the unborn baby (low birth weight and death). The condition usually heals after delivery but can recur in future pregnancies.  Palmar erythema. This is a reddening of the palms. It is most common in women with light complexions. It usually fades away after the baby is born.  Prurigo of pregnancy. This is a disease in which itchy red patches and bumps appear on the body. This condition can happen any time during pregnancy.  Usually, it starts as a few bumps but increases each day. The cause is unknown. The patches and bumps clear after the baby is born. This condition can recur with future pregnancies. The rash does not affect the unborn baby. Other skin conditions  Swelling and redness. This can occur on the face, eyelids, ankles, fingers, or toes.  Acne. Pimples may develop, including in women who have had clear skin for a long time.  Skin tags. These are small flaps of skin that stick out from the body. They may grow or become darker during pregnancy. They are usually harmless. They do not go away on their own but can be removed by a health care provider.  Moles. These are flat or slightly raised growths. They are usually round and pink or brown. They may grow or become darker during pregnancy. Some skin problems that were there before pregnancy (pre-existing skin conditions), such as atopic dermatitis or psoriasis, may become worse during pregnancy. What actions can I take to manage or treat these conditions? Different conditions may have different instructions for care. In general:  Follow all your health care provider's directions about medicines to treat skin problems while you are pregnant. Do not use over-the-counter medicines, creams, or lotions until you have checked with your health care  provider. Many medicines are not safe to use when you are pregnant.  Limit time in the sun. This will help keep your skin from darkening. When you must be outside, use sunscreen and wear a hat with a wide brim to protect your face. The sunscreen should have an SPF of at least 15.  Use a gentle soap. This helps prevent skin irritation.  Avoid getting too hot or too sweaty. This makes some skin rashes worse.  Wear loose clothing made of a soft fabric. This prevents skin irritation.  Use a skin moisturizer. Ask your health care provider for suggestions.  Exercise regularly, if possible. Ask your health care provider  about the activities that are safe for you. Healthy pregnant women should aim for 30 minutes of moderate exercise most days of the week. Examples of moderate exercise include walking or swimming. This can help reduce several pregnancy side effects, including varicose veins.  Keep all follow-up visits. This is important.      Contact a health care provider if:  Your rash is causing discomfort such as itching or pain. Get help right away if:  You do not feel movement of your baby as expected. Summary  Most skin problems that develop during pregnancy are not serious and are considered a normal part of pregnancy. They usually go away on their own after the baby is born.  Some common skin problems include stretch marks, darkening of the skin, spider veins, and varicose veins.  Follow all your health care provider's directions about medicines to treat skin problems while you are pregnant. Do not use any over-the-counter medicines, creams, or lotions until you have checked with your health care provider.  To prevent and manage skin problems, use gentle soap, try not to get too hot or sweaty, wear loose-fitting clothing, and limit time in the sun.  Some pre-existing skin conditions, such as atopic dermatitis or psoriasis, may become worse during pregnancy. This information is not intended to replace advice given to you by your health care provider. Make sure you discuss any questions you have with your health care provider. Document Revised: 05/08/2020 Document Reviewed: 05/08/2020 Elsevier Patient Education  2021 [employer].

## 2020-10-30 NOTE — Progress Notes (Signed)
OB-Pt present for routine prenatal care and 3 hr GTC. Pt c/o lower abd pain and light cramping.

## 2020-10-30 NOTE — Progress Notes (Signed)
I have seen, interviewed, and examined the patient in conjunction with the [facility] [employer] and affirm the diagnosis and management plan.   Gunnar Bulla, CNM [facility], [facility] 10/30/20 3:07 PM

## 2020-10-31 ENCOUNTER — Other Ambulatory Visit: Payer: Self-pay | Admitting: Certified Nurse Midwife

## 2020-10-31 DIAGNOSIS — O24419 Gestational diabetes mellitus in pregnancy, unspecified control: Secondary | ICD-10-CM

## 2020-10-31 DIAGNOSIS — Z3403 Encounter for supervision of normal first pregnancy, third trimester: Secondary | ICD-10-CM

## 2020-10-31 DIAGNOSIS — Z3A32 32 weeks gestation of pregnancy: Secondary | ICD-10-CM

## 2020-10-31 LAB — GESTATIONAL GLUCOSE TOLERANCE
Glucose, Fasting: 82 mg/dL (ref 65–94)
Glucose, GTT - 1 Hour: 188 mg/dL — ABNORMAL HIGH (ref 65–179)
Glucose, GTT - 2 Hour: 186 mg/dL — ABNORMAL HIGH (ref 65–154)
Glucose, GTT - 3 Hour: 123 mg/dL (ref 65–139)

## 2020-10-31 NOTE — Progress Notes (Signed)
Referral to [facility], see orders.    Cheryl Wong, CNM [facility], [facility] 10/31/20 11:31 AM

## 2020-11-09 ENCOUNTER — Ambulatory Visit: Payer: Medicaid Other | Admitting: *Deleted

## 2020-11-14 ENCOUNTER — Telehealth: Payer: Self-pay

## 2020-11-14 ENCOUNTER — Encounter: Payer: Self-pay | Admitting: Certified Nurse Midwife

## 2020-11-14 ENCOUNTER — Ambulatory Visit (INDEPENDENT_AMBULATORY_CARE_PROVIDER_SITE_OTHER): Payer: Medicaid Other | Admitting: Certified Nurse Midwife

## 2020-11-14 ENCOUNTER — Other Ambulatory Visit: Payer: Self-pay

## 2020-11-14 VITALS — BP 98/67 | HR 104 | Wt 163.8 lb

## 2020-11-14 DIAGNOSIS — Z3403 Encounter for supervision of normal first pregnancy, third trimester: Secondary | ICD-10-CM

## 2020-11-14 DIAGNOSIS — N3000 Acute cystitis without hematuria: Secondary | ICD-10-CM

## 2020-11-14 LAB — POCT URINALYSIS DIPSTICK OB
Blood, UA: NEGATIVE
Glucose, UA: NEGATIVE
Ketones, UA: NEGATIVE
Nitrite, UA: POSITIVE
Odor: NEGATIVE
POC,PROTEIN,UA: NEGATIVE
Spec Grav, UA: 1.01 (ref 1.010–1.025)
Urobilinogen, UA: 0.2 E.U./dL
pH, UA: 8.5 — AB (ref 5.0–8.0)

## 2020-11-14 MED ORDER — NITROFURANTOIN MONOHYD MACRO 100 MG PO CAPS
100.0000 mg | ORAL_CAPSULE | Freq: Every day | ORAL | 3 refills | Status: DC
Start: 2020-11-14 — End: 2020-11-27

## 2020-11-14 MED ORDER — NITROFURANTOIN MONOHYD MACRO 100 MG PO CAPS: 100.0000 mg | ORAL_CAPSULE | Freq: Two times a day (BID) | ORAL | 0 refills | Status: DC

## 2020-11-14 NOTE — Patient Instructions (Addendum)
Group B Streptococcus Infection During Pregnancy Group B Streptococcus (GBS) is a type of bacteria that is often found in healthy people. It is commonly found in the rectum, vagina, and intestines. In people who are healthy and not pregnant, the bacteria rarely cause serious illness or complications. However, women who test positive for GBS during pregnancy can pass the bacteria to the baby during childbirth. This can cause serious infection in the baby after birth. Women with GBS may also have infections during their pregnancy or soon after childbirth. The infections include urinary tract infections (UTIs) or infections of the uterus. GBS also increases a woman's risk of complications during pregnancy, such as early labor or delivery, miscarriage, or stillbirth. Routine testing for GBS is recommended for all pregnant women. What are the causes? This condition is caused by bacteria called Streptococcus agalactiae. What increases the risk? You may have a higher risk for GBS infection during pregnancy if you had one during a past pregnancy. What are the signs or symptoms? In most cases, GBS infection does not cause symptoms in pregnant women. If symptoms exist, they may include:  Labor that starts before the 37th week of pregnancy.  A UTI or bladder infection. This may cause a fever, frequent urination, or pain and burning during urination.  Fever during labor. There can also be a rapid heartbeat in the mother or baby. Rare but serious symptoms of a GBS infection in women include:  Blood infection (septicemia). This may cause fever, chills, or confusion.  Lung infection (pneumonia). This may cause fever, chills, cough, rapid breathing, chest pain, or difficulty breathing.  Bone, joint, skin, or soft tissue infection. How is this diagnosed? You may be screened for GBS between week 35 and week 37 of pregnancy. If you have symptoms of preterm labor, you may be screened earlier. This condition is  diagnosed based on lab test results from:  A swab of fluid from the vagina and rectum.  A urine sample. How is this treated? This condition is treated with antibiotic medicine. Antibiotic medicine may be given:  To you when you go into labor, or as soon as your water breaks. The medicines will continue until after you give birth. If you are having a cesarean delivery, you do not need antibiotics unless your water has broken.  To your baby, if he or she requires treatment. Your health care provider will check your baby to decide if he or she needs antibiotics to prevent a serious infection.   Follow these instructions at home:  Take over-the-counter and prescription medicines only as told by your health care provider.  Take your antibiotic medicine as told by your health care provider. Do not stop taking the antibiotic even if you start to feel better.  Keep all pre-birth (prenatal) visits and follow-up visits as told by your health care provider. This is important. Contact a health care provider if:  You have pain or burning when you urinate.  You have to urinate more often than usual.  You have a fever or chills.  You develop a bad-smelling vaginal discharge. Get help right away if:  Your water breaks.  You go into labor.  You have severe pain in your abdomen.  You have difficulty breathing.  You have chest pain. These symptoms may represent a serious problem that is an emergency. Do not wait to see if the symptoms will go away. Get medical help right away. Call your local emergency services (911 in the [place] Do not drive   yourself to the hospital. Summary  GBS is a type of bacteria that is common in healthy people.  During pregnancy, colonization with GBS can cause serious complications for you or your baby.  Your health care provider will screen you between 35 and 37 weeks of pregnancy to determine if you are colonized with GBS.  If you are colonized with GBS during  pregnancy, your health care provider will recommend antibiotics through an IV during labor.  After delivery, your baby will be evaluated for complications related to potential GBS infection and may require antibiotics to prevent a serious infection. This information is not intended to replace advice given to you by your health care provider. Make sure you discuss any questions you have with your health care provider. Document Revised: 07/04/2020 Document Reviewed: 03/29/2019 Elsevier Patient Education  [employer].   Common Medications Safe in Pregnancy  Acne:      Constipation:  Benzoyl Peroxide     Colace  Clindamycin      Dulcolax Suppository  Topica Erythromycin     Fibercon  Salicylic Acid      Metamucil         Miralax AVOID:        Senakot   Accutane    Cough:  Retin-A       Cough Drops  Tetracycline      Phenergan w/ Codeine if Rx  Minocycline      Robitussin (Plain & DM)  Antibiotics:     Crabs/Lice:  Ceclor       RID  Cephalosporins    AVOID:  E-Mycins      Kwell  Keflex  Macrobid/Macrodantin   Diarrhea:  Penicillin      Kao-Pectate  Zithromax      Imodium AD         PUSH FLUIDS AVOID:       Cipro     Fever:  Tetracycline      Tylenol (Regular or Extra  Minocycline       Strength)  Levaquin      Extra Strength-Do not          Exceed 8 tabs/24 hrs Caffeine:        <243m/day (equiv. To 1 cup of coffee or  approx. 3 12 oz sodas)         Gas: Cold/Hayfever:       Gas-X  Benadryl      Mylicon  Claritin       Phazyme  **Claritin-D        Chlor-Trimeton    Headaches:  Dimetapp      ASA-Free Excedrin  Drixoral-Non-Drowsy     Cold Compress  Mucinex (Guaifenasin)     Tylenol (Regular or Extra  Sudafed/Sudafed-12 Hour     Strength)  **Sudafed PE Pseudoephedrine   Tylenol Cold & Sinus     Vicks Vapor Rub  Zyrtec  **AVOID if Problems With Blood Pressure         Heartburn: Avoid lying down for at least 1 hour after  meals  Aciphex      Maalox     Rash:  Milk of Magnesia     Benadryl    Mylanta       1% Hydrocortisone Cream  Pepcid  Pepcid Complete   Sleep Aids:  Prevacid      Ambien   Prilosec       Benadryl  Rolaids       Chamomile Tea  Tums (Limit 4/day)  Unisom         Tylenol PM         Warm milk-add vanilla or  Hemorrhoids:       Sugar for taste  Anusol/Anusol H.C.  (RX: Analapram 2.5%)  Sugar Substitutes:  Hydrocortisone OTC     Ok in moderation  Preparation H      Tucks        Vaseline lotion applied to tissue with wiping    Herpes:     Throat:  Acyclovir      Oragel  Famvir  Valtrex     Vaccines:         Flu Shot Leg Cramps:       *Gardasil  Benadryl      Hepatitis A         Hepatitis B Nasal Spray:       Pneumovax  Saline Nasal Spray     Polio Booster         Tetanus Nausea:       Tuberculosis test or PPD  Vitamin B6 25 mg TID   AVOID:    Dramamine      *Gardasil  Emetrol       Live Poliovirus  Ginger Root 250 mg QID    MMR (measles, mumps &  High Complex Carbs @ Bedtime    rebella)  Sea Bands-Accupressure    Varicella (Chickenpox)  Unisom 1/2 tab TID     *No known complications           If received before Pain:         Known pregnancy;   Darvocet       Resume series after  Lortab        Delivery  Percocet    Yeast:   Tramadol      Femstat  Tylenol 3      Gyne-lotrimin  Ultram       Monistat  Vicodin           MISC:         All Sunscreens           Hair Coloring/highlights          Insect Repellant's          (Including DEET)         Mystic Tans  

## 2020-11-14 NOTE — Progress Notes (Signed)
ROB doing well. Feels good movement. Discussed fetal movement and kick counts. PT has GDM and has appointment for diabetic teaching on 3/8. Reviewed importance and instructed pt to bring in log for review with Marcelino Duster at her next appointment. Discussed GBS and testing at next visit.  Urine positive for nitates , cultures sent. Macrobid ordered  Along with suppressive therapy given that this is her second UTI this pregnancy. .She verbalizes and agrees . Follow up 2 wk with Marcelino Duster.   Doreene Burke, CNM

## 2020-11-14 NOTE — Telephone Encounter (Signed)
-----   Message from Doreene Burke, [place] sent at 11/14/2020 10:15 AM EST ----- I need you to reach out to her and let her know that after she finishes the week of Macrobid that you ordered she will take it daily for suppression. This is her second time with UTI in this pregnancy. Per Dr. Valentino Saxon & Logan Bores she is to be on daily suppression until delivery. Given she has a language barrier I would prefer that you speak with her to make sure she understands that she should take the week course ( dose BID) then when completed she starts the 100 mg once a day.   Thanks  Pattricia Boss

## 2020-11-14 NOTE — Progress Notes (Signed)
Pt c/o of diarrhea and pelvic pain. Wants a refill of eczema cream.

## 2020-11-14 NOTE — Telephone Encounter (Signed)
Pt aware.

## 2020-11-16 ENCOUNTER — Other Ambulatory Visit: Payer: Self-pay | Admitting: Certified Nurse Midwife

## 2020-11-16 LAB — URINE CULTURE

## 2020-11-16 MED ORDER — AMPICILLIN 500 MG PO CAPS: 500.0000 mg | ORAL_CAPSULE | Freq: Four times a day (QID) | ORAL | 0 refills | Status: AC

## 2020-11-17 ENCOUNTER — Telehealth: Payer: Self-pay

## 2020-11-17 NOTE — Telephone Encounter (Signed)
Pt aware.

## 2020-11-17 NOTE — Telephone Encounter (Signed)
-----   Message from Doreene Burke, [place] sent at 11/17/2020 12:44 PM EST ----- Cheryl Wong,   You called and talked to this pt about her uti and taking Macrobid, well her culture came back positive for GBS so I ordered Ampicillin . I sent her a message but given her language barrier I just want to make sure she understand. She can stop the Macrobid that was ordered for the week and take the ampicillin for the week instead . Then after completion take the daily Macrobid for suppression for the remainder of her pregnancy.   Thanks  Pattricia Boss

## 2020-11-21 ENCOUNTER — Other Ambulatory Visit: Payer: Self-pay

## 2020-11-21 ENCOUNTER — Encounter: Payer: Medicaid Other | Attending: Certified Nurse Midwife | Admitting: *Deleted

## 2020-11-21 ENCOUNTER — Encounter: Payer: Self-pay | Admitting: *Deleted

## 2020-11-21 VITALS — BP 100/68 | Ht 62.0 in | Wt 167.1 lb

## 2020-11-21 DIAGNOSIS — Z3A32 32 weeks gestation of pregnancy: Secondary | ICD-10-CM | POA: Insufficient documentation

## 2020-11-21 DIAGNOSIS — O24419 Gestational diabetes mellitus in pregnancy, unspecified control: Secondary | ICD-10-CM | POA: Diagnosis present

## 2020-11-21 DIAGNOSIS — O24410 Gestational diabetes mellitus in pregnancy, diet controlled: Secondary | ICD-10-CM

## 2020-11-21 NOTE — Progress Notes (Signed)
Diabetes Self-Management Education  Visit Type: First/Initial  Appt. Start Time: 1310 Appt. End Time: 1420  11/21/2020  Cheryl Wong, identified by name and date of birth, is a 25 y.o. female with a diagnosis of Diabetes: Gestational Diabetes.   ASSESSMENT  Blood pressure 100/68, height 5\' 2"  (1.575 m), weight 167 lb 1.6 oz (75.8 kg), last menstrual period 03/23/2020, estimated date of delivery 12/24/2020 Body mass index is 30.56 kg/m.   Diabetes Self-Management Education - 11/21/20 1444      Visit Information   Visit Type First/Initial      Initial Visit   Diabetes Type Gestational Diabetes    Are you currently following a meal plan? No    Are you taking your medications as prescribed? Yes    Date Diagnosed "2 weeks ago"      Health Coping   How would you rate your overall health? Good      Psychosocial Assessment   Patient Belief/Attitude about Diabetes Other (comment)   "normal"   Self-care barriers None    Self-management support Doctor's office;Family    Patient Concerns Glycemic Control    Special Needs None    Preferred Learning Style Auditory    Learning Readiness Contemplating    How often do you need to have someone help you when you read instructions, pamphlets, or other written materials from your doctor or pharmacy? 2 - Rarely    What is the last grade level you completed in school? high school      Pre-Education Assessment   Patient understands the diabetes disease and treatment process. Needs Instruction    Patient understands incorporating nutritional management into lifestyle. Needs Instruction    Patient undertands incorporating physical activity into lifestyle. Needs Instruction    Patient understands using medications safely. Needs Instruction    Patient understands monitoring blood glucose, interpreting and using results Needs Instruction    Patient understands prevention, detection, and treatment of acute complications. Needs Instruction     Patient understands prevention, detection, and treatment of chronic complications. Needs Instruction    Patient understands how to develop strategies to address psychosocial issues. Needs Instruction    Patient understands how to develop strategies to promote health/change behavior. Needs Instruction      Complications   How often do you check your blood sugar? 0 times/day (not testing)   Provided Accu-Chek Guide Me meter and instructed on use. BG upon return demonstration was 85 mg/dL at 01/21/21 pm - 3 1/2 hrs pp.   Have you had a dilated eye exam in the past 12 months? No    Have you had a dental exam in the past 12 months? Yes    Are you checking your feet? No      Dietary Intake   Breakfast egg, bread, peanut butter; cereal 1 x month    Snack (morning) 1-2 snacks/day - peanut butter crackers, cake, cookies, fruit (apple, orange, banana, grapes, peach)    Lunch rice, chicken, salad with lettuce tomatoes cuccumbers dressing - 9:70, chicken, occasional beef and fish; pasta, potates, carrots, onions, bread    Beverage(s) water, juice, soft drinks, sugar sweetened tea and coffee      Exercise   Exercise Type Light (walking / raking leaves)    How many days per week to you exercise? 3    How many minutes per day do you exercise? 20    Total minutes per week of exercise 60  Patient Education   Previous Diabetes Education No    Disease state  Definition of diabetes, type 1 and 2, and the diagnosis of diabetes;Factors that contribute to the development of diabetes    Nutrition management  Role of diet in the treatment of diabetes and the relationship between the three main macronutrients and blood glucose level;Food label reading, portion sizes and measuring food.;Reviewed blood glucose goals for pre and post meals and how to evaluate the patients' food intake on their blood glucose level.    Physical activity and exercise  Role of exercise on diabetes management, blood  pressure control and cardiac health.    Medications Other (comment)   limited use of oral medicatios during pregnancy and potential for insulin   Monitoring Taught/evaluated SMBG meter.;Purpose and frequency of SMBG.;Taught/discussed recording of test results and interpretation of SMBG.    Chronic complications Relationship between chronic complications and blood glucose control    Psychosocial adjustment Identified and addressed patients feelings and concerns about diabetes    Preconception care Pregnancy and GDM  Role of pre-pregnancy blood glucose control on the development of the fetus;Reviewed with patient blood glucose goals with pregnancy;Role of family planning for patients with diabetes      Individualized Goals (developed by patient)   Reducing Risk Other (comment)   improve blood sugars     Outcomes   Expected Outcomes Demonstrated limited interest in learning.  Expect minimal changes   Pt was texting on her phone and asked how much longer this would take. Also attempted to get a honey bun from the vending machine after the visit but it got stuck.   Program Status Not Completed           Individualized Plan for Diabetes Self-Management Training:   Learning Objective:  Patient will have a greater understanding of diabetes self-management. Patient education plan is to attend individual and/or group sessions per assessed needs and concerns.   Plan:   Patient Instructions  Read booklet on Gestational Diabetes Follow Gestational Meal Planning Guidelines Limit fried foods and desserts/sweets Avoid sugar sweetened drinks (juice, soft drinks, tea, coffee) Check blood sugars 4 x day - before breakfast and 2 hrs after every meal and record  Bring blood sugar log to all MD appointments Call MD for prescription for meter strips and lancets Strips  Accu-Chek Guide Lancets   Accu-Chek Softclix Walk 20-30 minutes at least 5 x week if permitted by MD  Expected Outcomes:  Demonstrated  limited interest in learning.  Expect minimal changes (Pt was texting on her phone and asked how much longer this would take. Also attempted to get a honey bun from the vending machine after the visit but it got stuck.)  Education material provided: Gestational Booklet Gestational Meal Planning Guidelines Simple Meal Plan Meter = Accu-Chek Guide Me Goals for a Healthy Pregnancy  If problems or questions, patient to contact team via:  Sharion Settler, RN, CCM, CDCES [phone]  Future DSME appointment:  The patient doesn't want to return at this time to see the dietitian.

## 2020-11-21 NOTE — Patient Instructions (Signed)
Read booklet on Gestational Diabetes Follow Gestational Meal Planning Guidelines Limit fried foods and desserts/sweets Avoid sugar sweetened drinks (juice, soft drinks, tea, coffee) Check blood sugars 4 x day - before breakfast and 2 hrs after every meal and record  Bring blood sugar log to all MD appointments Call MD for prescription for meter strips and lancets Strips  Accu-Chek Guide Lancets   Accu-Chek Softclix Walk 20-30 minutes at least 5 x week if permitted by MD

## 2020-11-27 ENCOUNTER — Encounter: Payer: Self-pay | Admitting: Certified Nurse Midwife

## 2020-11-27 ENCOUNTER — Other Ambulatory Visit: Payer: Self-pay

## 2020-11-27 ENCOUNTER — Ambulatory Visit (INDEPENDENT_AMBULATORY_CARE_PROVIDER_SITE_OTHER): Payer: Medicaid Other | Admitting: Certified Nurse Midwife

## 2020-11-27 VITALS — BP 126/75 | HR 108 | Wt 171.2 lb

## 2020-11-27 DIAGNOSIS — Z3483 Encounter for supervision of other normal pregnancy, third trimester: Secondary | ICD-10-CM

## 2020-11-27 DIAGNOSIS — Z3A36 36 weeks gestation of pregnancy: Secondary | ICD-10-CM

## 2020-11-27 DIAGNOSIS — B951 Streptococcus, group B, as the cause of diseases classified elsewhere: Secondary | ICD-10-CM | POA: Insufficient documentation

## 2020-11-27 DIAGNOSIS — O24419 Gestational diabetes mellitus in pregnancy, unspecified control: Secondary | ICD-10-CM | POA: Insufficient documentation

## 2020-11-27 DIAGNOSIS — O2343 Unspecified infection of urinary tract in pregnancy, third trimester: Secondary | ICD-10-CM | POA: Insufficient documentation

## 2020-11-27 LAB — POCT URINALYSIS DIPSTICK OB
Bilirubin, UA: NEGATIVE
Blood, UA: NEGATIVE
Glucose, UA: NEGATIVE
Ketones, UA: NEGATIVE
Leukocytes, UA: NEGATIVE
Nitrite, UA: NEGATIVE
POC,PROTEIN,UA: NEGATIVE
Spec Grav, UA: 1.01 (ref 1.010–1.025)
Urobilinogen, UA: 0.2 E.U./dL
pH, UA: 7 (ref 5.0–8.0)

## 2020-11-27 MED ORDER — ACCU-CHEK SOFTCLIX LANCETS MISC: 3 refills | Status: DC

## 2020-11-27 MED ORDER — NYSTATIN-TRIAMCINOLONE 100000-0.1 UNIT/GM-% EX OINT: 1.0000 "application " | TOPICAL_OINTMENT | Freq: Two times a day (BID) | CUTANEOUS | 0 refills | Status: DC

## 2020-11-27 MED ORDER — GLUCOSE BLOOD VI STRP: ORAL_STRIP | 3 refills | Status: DC

## 2020-11-27 NOTE — Patient Instructions (Addendum)
Back Pain in Pregnancy Back pain during pregnancy is common. Back pain may be caused by several factors that are related to changes during your pregnancy. Follow these instructions at home: Managing pain, stiffness, and swelling  If directed, for sudden (acute) back pain, put ice on the painful area. ? Put ice in a plastic bag. ? Place a towel between your skin and the bag. ? Leave the ice on for 20 minutes, 2-3 times per day.  If directed, apply heat to the affected area before you exercise. Use the heat source that your health care provider recommends, such as a moist heat pack or a heating pad. ? Place a towel between your skin and the heat source. ? Leave the heat on for 20-30 minutes. ? Remove the heat if your skin turns bright red. This is especially important if you are unable to feel pain, heat, or cold. You may have a greater risk of getting burned.  If directed, massage the affected area.      Activity  Exercise as told by your health care provider. Gentle exercise is the best way to prevent or manage back pain.  Listen to your body when lifting. If lifting hurts, ask for help or bend your knees. This uses your leg muscles instead of your back muscles.  Squat down when picking up something from the floor. Do not bend over.  Only use bed rest for short periods as told by your health care provider. Bed rest should only be used for the most severe episodes of back pain. Standing, sitting, and lying down  Do not stand in one place for long periods of time.  Use good posture when sitting. Make sure your head rests over your shoulders and is not hanging forward. Use a pillow on your lower back if necessary.  Try sleeping on your side, preferably the left side, with a pregnancy support pillow or 1-2 regular pillows between your legs. ? If you have back pain after a night's rest, your bed may be too soft. ? A firm mattress may provide more support for your back during  pregnancy. General instructions  Do not wear high heels.  Eat a healthy diet. Try to gain weight within your health care provider's recommendations.  Use a maternity girdle, elastic sling, or back brace as told by your health care provider.  Take over-the-counter and prescription medicines only as told by your health care provider.  Work with a physical therapist or massage therapist to find ways to manage back pain. Acupuncture or massage therapy may be helpful.  Keep all follow-up visits as told by your health care provider. This is important. Contact a health care provider if:  Your back pain interferes with your daily activities.  You have increasing pain in other parts of your body. Get help right away if:  You develop numbness, tingling, weakness, or problems with the use of your arms or legs.  You develop severe back pain that is not controlled with medicine.  You have a change in bowel or bladder control.  You develop shortness of breath, dizziness, or you faint.  You develop nausea, vomiting, or sweating.  You have back pain that is a rhythmic, cramping pain similar to labor pains. Labor pain is usually 1-2 minutes apart, lasts for about 1 minute, and involves a bearing down feeling or pressure in your pelvis.  You have back pain and your water breaks or you have vaginal bleeding.  You have back pain or  numbness that travels down your leg.  Your back pain developed after you fell.  You develop pain on one side of your back.  You see blood in your urine.  You develop skin blisters in the area of your back pain. Summary  Back pain may be caused by several factors that are related to changes during your pregnancy.  Follow instructions as told by your health care provider for managing pain, stiffness, and swelling.  Exercise as told by your health care provider. Gentle exercise is the best way to prevent or manage back pain.  Take over-the-counter and  prescription medicines only as told by your health care provider.  Keep all follow-up visits as told by your health care provider. This is important. This information is not intended to replace advice given to you by your health care provider. Make sure you discuss any questions you have with your health care provider. Document Revised: 12/22/2018 Document Reviewed: 02/18/2018 Elsevier Patient Education  2021 [employer] Inc[employer]    Breastfeeding  Choosing to breastfeed is one of the best decisions you can make for yourself and your baby. A change in hormones during pregnancy causes your breasts to make breast milk in your milk-producing glands. Hormones prevent breast milk from being released before your baby is born. They also prompt milk flow after birth. Once breastfeeding has begun, thoughts of your baby, as well as his or her sucking or crying, can stimulate the release of milk from your milk-producing glands. Benefits of breastfeeding Research shows that breastfeeding offers many health benefits for infants and mothers. It also offers a cost-free and convenient way to feed your baby. For your baby  Your first milk (colostrum) helps your baby's digestive system to function better.  Special cells in your milk (antibodies) help your baby to fight off infections.  Breastfed babies are less likely to develop asthma, allergies, obesity, or type 2 diabetes. They are also at lower risk for sudden infant death syndrome (SIDS).  Nutrients in breast milk are better able to meet your baby's needs compared to infant formula.  Breast milk improves your baby's brain development. For you  Breastfeeding helps to create a very special bond between you and your baby.  Breastfeeding is convenient. Breast milk costs nothing and is always available at the correct temperature.  Breastfeeding helps to burn calories. It helps you to lose the weight that you gained during pregnancy.  Breastfeeding makes your  uterus return faster to its size before pregnancy. It also slows bleeding (lochia) after you give birth.  Breastfeeding helps to lower your risk of developing type 2 diabetes, osteoporosis, rheumatoid arthritis, cardiovascular disease, and breast, ovarian, uterine, and endometrial cancer later in life. Breastfeeding basics Starting breastfeeding  Find a comfortable place to sit or lie down, with your neck and back well-supported.  Place a pillow or a rolled-up blanket under your baby to bring him or her to the level of your breast (if you are seated). Nursing pillows are specially designed to help support your arms and your baby while you breastfeed.  Make sure that your baby's tummy (abdomen) is facing your abdomen.  Gently massage your breast. With your fingertips, massage from the outer edges of your breast inward toward the nipple. This encourages milk flow. If your milk flows slowly, you may need to continue this action during the feeding.  Support your breast with 4 fingers underneath and your thumb above your nipple (make the letter "C" with your hand). Make sure your  fingers are well away from your nipple and your baby's mouth.  Stroke your baby's lips gently with your finger or nipple.  When your baby's mouth is open wide enough, quickly bring your baby to your breast, placing your entire nipple and as much of the areola as possible into your baby's mouth. The areola is the colored area around your nipple. ? More areola should be visible above your baby's upper lip than below the lower lip. ? Your baby's lips should be opened and extended outward (flanged) to ensure an adequate, comfortable latch. ? Your baby's tongue should be between his or her lower gum and your breast.  Make sure that your baby's mouth is correctly positioned around your nipple (latched). Your baby's lips should create a seal on your breast and be turned out (everted).  It is common for your baby to suck about  2-3 minutes in order to start the flow of breast milk. Latching Teaching your baby how to latch onto your breast properly is very important. An improper latch can cause nipple pain, decreased milk supply, and poor weight gain in your baby. Also, if your baby is not latched onto your nipple properly, he or she may swallow some air during feeding. This can make your baby fussy. Burping your baby when you switch breasts during the feeding can help to get rid of the air. However, teaching your baby to latch on properly is still the best way to prevent fussiness from swallowing air while breastfeeding. Signs that your baby has successfully latched onto your nipple  Silent tugging or silent sucking, without causing you pain. Infant's lips should be extended outward (flanged).  Swallowing heard between every 3-4 sucks once your milk has started to flow (after your let-down milk reflex occurs).  Muscle movement above and in front of his or her ears while sucking. Signs that your baby has not successfully latched onto your nipple  Sucking sounds or smacking sounds from your baby while breastfeeding.  Nipple pain. If you think your baby has not latched on correctly, slip your finger into the corner of your baby's mouth to break the suction and place it between your baby's gums. Attempt to start breastfeeding again. Signs of successful breastfeeding Signs from your baby  Your baby will gradually decrease the number of sucks or will completely stop sucking.  Your baby will fall asleep.  Your baby's body will relax.  Your baby will retain a small amount of milk in his or her mouth.  Your baby will let go of your breast by himself or herself. Signs from you  Breasts that have increased in firmness, weight, and size 1-3 hours after feeding.  Breasts that are softer immediately after breastfeeding.  Increased milk volume, as well as a change in milk consistency and color by the fifth day of  breastfeeding.  Nipples that are not sore, cracked, or bleeding. Signs that your baby is getting enough milk  Wetting at least 1-2 diapers during the first 24 hours after birth.  Wetting at least 5-6 diapers every 24 hours for the first week after birth. The urine should be clear or pale yellow by the age of 5 days.  Wetting 6-8 diapers every 24 hours as your baby continues to grow and develop.  At least 3 stools in a 24-hour period by the age of 5 days. The stool should be soft and yellow.  At least 3 stools in a 24-hour period by the age of 7 days.  The stool should be seedy and yellow.  No loss of weight greater than 10% of birth weight during the first 3 days of life.  Average weight gain of 4-7 oz (113-198 g) per week after the age of 4 days.  Consistent daily weight gain by the age of 5 days, without weight loss after the age of 2 weeks. After a feeding, your baby may spit up a small amount of milk. This is normal. Breastfeeding frequency and duration Frequent feeding will help you make more milk and can prevent sore nipples and extremely full breasts (breast engorgement). Breastfeed when you feel the need to reduce the fullness of your breasts or when your baby shows signs of hunger. This is called "breastfeeding on demand." Signs that your baby is hungry include:  Increased alertness, activity, or restlessness.  Movement of the head from side to side.  Opening of the mouth when the corner of the mouth or cheek is stroked (rooting).  Increased sucking sounds, smacking lips, cooing, sighing, or squeaking.  Hand-to-mouth movements and sucking on fingers or hands.  Fussing or crying. Avoid introducing a pacifier to your baby in the first 4-6 weeks after your baby is born. After this time, you may choose to use a pacifier. Research has shown that pacifier use during the first year of a baby's life decreases the risk of sudden infant death syndrome (SIDS). Allow your baby to feed  on each breast as long as he or she wants. When your baby unlatches or falls asleep while feeding from the first breast, offer the second breast. Because newborns are often sleepy in the first few weeks of life, you may need to awaken your baby to get him or her to feed. Breastfeeding times will vary from baby to baby. However, the following rules can serve as a guide to help you make sure that your baby is properly fed:  Newborns (babies 40 weeks of age or younger) may breastfeed every 1-3 hours.  Newborns should not go without breastfeeding for longer than 3 hours during the day or 5 hours during the night.  You should breastfeed your baby a minimum of 8 times in a 24-hour period. Breast milk pumping Pumping and storing breast milk allows you to make sure that your baby is exclusively fed your breast milk, even at times when you are unable to breastfeed. This is especially important if you go back to work while you are still breastfeeding, or if you are not able to be present during feedings. Your lactation consultant can help you find a method of pumping that works best for you and give you guidelines about how long it is safe to store breast milk.      Caring for your breasts while you breastfeed Nipples can become dry, cracked, and sore while breastfeeding. The following recommendations can help keep your breasts moisturized and healthy:  Avoid using soap on your nipples.  Wear a supportive bra designed especially for nursing. Avoid wearing underwire-style bras or extremely tight bras (sports bras).  Air-dry your nipples for 3-4 minutes after each feeding.  Use only cotton bra pads to absorb leaked breast milk. Leaking of breast milk between feedings is normal.  Use lanolin on your nipples after breastfeeding. Lanolin helps to maintain your skin's normal moisture barrier. Pure lanolin is not harmful (not toxic) to your baby. You may also hand express a few drops of breast milk and gently  massage that milk into your nipples and allow the  milk to air-dry. In the first few weeks after giving birth, some women experience breast engorgement. Engorgement can make your breasts feel heavy, warm, and tender to the touch. Engorgement peaks within 3-5 days after you give birth. The following recommendations can help to ease engorgement:  Completely empty your breasts while breastfeeding or pumping. You may want to start by applying warm, moist heat (in the shower or with warm, water-soaked hand towels) just before feeding or pumping. This increases circulation and helps the milk flow. If your baby does not completely empty your breasts while breastfeeding, pump any extra milk after he or she is finished.  Apply ice packs to your breasts immediately after breastfeeding or pumping, unless this is too uncomfortable for you. To do this: ? Put ice in a plastic bag. ? Place a towel between your skin and the bag. ? Leave the ice on for 20 minutes, 2-3 times a day.  Make sure that your baby is latched on and positioned properly while breastfeeding. If engorgement persists after 48 hours of following these recommendations, contact your health care provider or a Science writer. Overall health care recommendations while breastfeeding  Eat 3 healthy meals and 3 snacks every day. Well-nourished mothers who are breastfeeding need an additional 450-500 calories a day. You can meet this requirement by increasing the amount of a balanced diet that you eat.  Drink enough water to keep your urine pale yellow or clear.  Rest often, relax, and continue to take your prenatal vitamins to prevent fatigue, stress, and low vitamin and mineral levels in your body (nutrient deficiencies).  Do not use any products that contain nicotine or tobacco, such as cigarettes and e-cigarettes. Your baby may be harmed by chemicals from cigarettes that pass into breast milk and exposure to secondhand smoke. If you need help  quitting, ask your health care provider.  Avoid alcohol.  Do not use illegal drugs or marijuana.  Talk with your health care provider before taking any medicines. These include over-the-counter and prescription medicines as well as vitamins and herbal supplements. Some medicines that may be harmful to your baby can pass through breast milk.  It is possible to become pregnant while breastfeeding. If birth control is desired, ask your health care provider about options that will be safe while breastfeeding your baby. Where to find more information: [employer]: www.llli.org Contact a health care provider if:  You feel like you want to stop breastfeeding or have become frustrated with breastfeeding.  Your nipples are cracked or bleeding.  Your breasts are red, tender, or warm.  You have: ? Painful breasts or nipples. ? A swollen area on either breast. ? A fever or chills. ? Nausea or vomiting. ? Drainage other than breast milk from your nipples.  Your breasts do not become full before feedings by the fifth day after you give birth.  You feel sad and depressed.  Your baby is: ? Too sleepy to eat well. ? Having trouble sleeping. ? More than 27 week old and wetting fewer than 6 diapers in a 24-hour period. ? Not gaining weight by 23 days of age.  Your baby has fewer than 3 stools in a 24-hour period.  Your baby's skin or the white parts of his or her eyes become yellow. Get help right away if:  Your baby is overly tired (lethargic) and does not want to wake up and feed.  Your baby develops an unexplained fever. Summary  Breastfeeding offers many health  benefits for infant and mothers.  Try to breastfeed your infant when he or she shows early signs of hunger.  Gently tickle or stroke your baby's lips with your finger or nipple to allow the baby to open his or her mouth. Bring the baby to your breast. Make sure that much of the areola is in your baby's mouth.  Offer one side and burp the baby before you offer the other side.  Talk with your health care provider or lactation consultant if you have questions or you face problems as you breastfeed. This information is not intended to replace advice given to you by your health care provider. Make sure you discuss any questions you have with your health care provider. Document Revised: 11/27/2017 Document Reviewed: 10/04/2016 Elsevier Patient Education  [place]. Common Medications Safe in Pregnancy  Acne:      Constipation:  Benzoyl Peroxide     Colace  Clindamycin      Dulcolax Suppository  Topica Erythromycin     Fibercon  Salicylic Acid      Metamucil         Miralax AVOID:        Senakot   Accutane    Cough:  Retin-A       Cough Drops  Tetracycline      Phenergan w/ Codeine if Rx  Minocycline      Robitussin (Plain & DM)  Antibiotics:     Crabs/Lice:  Ceclor       RID  Cephalosporins    AVOID:  E-Mycins      Kwell  Keflex  Macrobid/Macrodantin   Diarrhea:  Penicillin      Kao-Pectate  Zithromax      Imodium AD         PUSH FLUIDS AVOID:       Cipro     Fever:  Tetracycline      Tylenol (Regular or Extra  Minocycline       Strength)  Levaquin      Extra Strength-Do not          Exceed 8 tabs/24 hrs Caffeine:        '200mg'$ /day (equiv. To 1 cup of coffee or  approx. 3 12 oz sodas)         Gas: Cold/Hayfever:       Gas-X  Benadryl      Mylicon  Claritin       Phazyme  **Claritin-D        Chlor-Trimeton    Headaches:  Dimetapp      ASA-Free Excedrin  Drixoral-Non-Drowsy     Cold Compress  Mucinex (Guaifenasin)     Tylenol (Regular or Extra  Sudafed/Sudafed-12 Hour     Strength)  **Sudafed PE Pseudoephedrine   Tylenol Cold & Sinus     Vicks Vapor Rub  Zyrtec  **AVOID if Problems With Blood Pressure         Heartburn: Avoid lying down for at least 1 hour after meals  Aciphex      Maalox     Rash:  Milk of Magnesia     Benadryl    Mylanta       1%  Hydrocortisone Cream  Pepcid  Pepcid Complete   Sleep Aids:  Prevacid      Ambien   Prilosec       Benadryl  Rolaids       Chamomile Tea  Tums (Limit 4/day)     Unisom  Tylenol PM         Warm milk-add vanilla or  Hemorrhoids:       Sugar for taste  Anusol/Anusol H.C.  (RX: Analapram 2.5%)  Sugar Substitutes:  Hydrocortisone OTC     Ok in moderation  Preparation H      Tucks        Vaseline lotion applied to tissue with wiping    Herpes:     Throat:  Acyclovir      Oragel  Famvir  Valtrex     Vaccines:         Flu Shot Leg Cramps:       *Gardasil  Benadryl      Hepatitis A         Hepatitis B Nasal Spray:       Pneumovax  Saline Nasal Spray     Polio Booster         Tetanus Nausea:       Tuberculosis test or PPD  Vitamin B6 25 mg TID   AVOID:    Dramamine      *Gardasil  Emetrol       Live Poliovirus  Ginger Root 250 mg QID    MMR (measles, mumps &  High Complex Carbs @ Bedtime    rebella)  Sea Bands-Accupressure    Varicella (Chickenpox)  Unisom 1/2 tab TID     *No known complications           If received before Pain:         Known pregnancy;   Darvocet       Resume series after  Lortab        Delivery  Percocet    Yeast:   Tramadol      Femstat  Tylenol 3      Gyne-lotrimin  Ultram       Monistat  Vicodin           MISC:         All Sunscreens           Hair Coloring/highlights          Insect Repellant's          (Including DEET)         Mystic Tans

## 2020-11-27 NOTE — Progress Notes (Signed)
OB-Pt present for routine prenatal care and 36 week cultures.

## 2020-11-27 NOTE — Progress Notes (Signed)
I have seen, interviewed, and examined the patient in conjunction with the [employer] [facility] [employer] and affirm the diagnosis and management plan.   Gunnar Bulla, CNM [facility], [facility] 11/27/20 1:48 PM

## 2020-11-27 NOTE — Progress Notes (Addendum)
ROB- Doing well overall, complains of lower abdominal pain/pressure and lower back pain. Appointment with [facility] completed 11/21/2020; for further details, please see note. Emphasized the important of diabetic diet, glucose monitoring, and follow up with medication nutrition therapy during pregnancy. Majority of fasting and PP elevated, advised would start Glyburide on Thursday if sugars not wnl after dietary changes. 36 week swab collected except GBS, see orders. Pre-labor checklist, birth affirmation and herbal prep guide given. Anticipatory guidance regarding course of prenatal care. Reviewed red flag symptoms and when to call. Referral to MFM for growth ultrasound. RTC x Thursday for blood sugar log check.   Juliann Pares, Student-MidWife [employer] 11/27/20 11:39 AM

## 2020-11-30 ENCOUNTER — Ambulatory Visit (INDEPENDENT_AMBULATORY_CARE_PROVIDER_SITE_OTHER): Payer: Medicaid Other | Admitting: Certified Nurse Midwife

## 2020-11-30 ENCOUNTER — Other Ambulatory Visit: Payer: Self-pay | Admitting: Certified Nurse Midwife

## 2020-11-30 ENCOUNTER — Encounter: Payer: Self-pay | Admitting: Certified Nurse Midwife

## 2020-11-30 ENCOUNTER — Other Ambulatory Visit: Payer: Self-pay

## 2020-11-30 VITALS — BP 101/68 | HR 87 | Wt 173.6 lb

## 2020-11-30 DIAGNOSIS — Z3483 Encounter for supervision of other normal pregnancy, third trimester: Secondary | ICD-10-CM | POA: Diagnosis not present

## 2020-11-30 DIAGNOSIS — Z3A36 36 weeks gestation of pregnancy: Secondary | ICD-10-CM

## 2020-11-30 DIAGNOSIS — O24419 Gestational diabetes mellitus in pregnancy, unspecified control: Secondary | ICD-10-CM

## 2020-11-30 DIAGNOSIS — Z3689 Encounter for other specified antenatal screening: Secondary | ICD-10-CM

## 2020-11-30 DIAGNOSIS — O24415 Gestational diabetes mellitus in pregnancy, controlled by oral hypoglycemic drugs: Secondary | ICD-10-CM | POA: Diagnosis not present

## 2020-11-30 LAB — POCT URINALYSIS DIPSTICK OB
Bilirubin, UA: NEGATIVE
Blood, UA: NEGATIVE
Glucose, UA: NEGATIVE
Ketones, UA: NEGATIVE
Nitrite, UA: NEGATIVE
POC,PROTEIN,UA: NEGATIVE
Spec Grav, UA: 1.01 (ref 1.010–1.025)
Urobilinogen, UA: 0.2 E.U./dL
pH, UA: 5 (ref 5.0–8.0)

## 2020-11-30 MED ORDER — NITROFURANTOIN MONOHYD MACRO 100 MG PO CAPS: 100.0000 mg | ORAL_CAPSULE | Freq: Every day | ORAL | 1 refills | Status: DC

## 2020-11-30 MED ORDER — GLYBURIDE 5 MG PO TABS: 5.0000 mg | ORAL_TABLET | Freq: Every day | ORAL | 0 refills | Status: DC

## 2020-11-30 MED ORDER — CLOTRIMAZOLE-BETAMETHASONE 1-0.05 % EX CREA: 1.0000 "application " | TOPICAL_CREAM | Freq: Two times a day (BID) | CUTANEOUS | 0 refills | Status: DC

## 2020-11-30 NOTE — Patient Instructions (Addendum)
Glyburide tablets What is this medicine? GLYBURIDE (GLYE byoor ide) helps to treat type 2 diabetes. Treatment is combined with diet and exercise. The medicine helps your body to use insulin better. This medicine may be used for other purposes; ask your health care provider or pharmacist if you have questions. COMMON BRAND NAME(S): Diabeta, Glycron, Glynase PresTab, Micronase What should I tell my health care provider before I take this medicine? They need to know if you have any of these conditions:  diabetic ketoacidosis  glucose-6-phosphate dehydrogenase deficiency  heart disease  kidney disease  liver disease  severe infection or injury  thyroid disease  an unusual or allergic reaction to glyburide, sulfa drugs, other medicines, foods, dyes, or preservatives  pregnant or trying to get pregnant  breast-feeding How should I use this medicine? Take this medicine by mouth with a glass of water. Follow the directions on the prescription label. If you take this medicine once a day, take it with breakfast or the first main meal of the day. Take your medicine at the same time each day. Do not take more often than directed. Talk to your pediatrician regarding the use of this medicine in children. Special care may be needed. Elderly patients over 4 years old may have a stronger reaction and need a smaller dose. Overdosage: If you think you have taken too much of this medicine contact a poison control center or emergency room at once. NOTE: This medicine is only for you. Do not share this medicine with others. What if I miss a dose? If you miss a dose, take it as soon as you can. If it is almost time for your next dose, take only that dose. Do not take double or extra doses. What may interact with this medicine? Do not take this medicine with any of the following medications:  bosentan This medicine may also interact with the following  medications:  cisapride  clarithromycin  colesevelam  metoclopramide  miconazole  probenecid  topiramate  warfarin Many medications may cause an increase or decrease in blood sugar, these include:  alcohol containing beverages  angiotensin converting enzyme inhibitors like enalapril, captopril, and lisinopril  aspirin and aspirin-like drugs  beta-blockers like metoprolol and propranolol  calcium channel blockers like diltiazem and verapamil  chloramphenicol  chromium  female hormones, like estrogens or progestins and birth control pills  fluoxetine  heart medicines like disopyramide  female hormones or anabolic steroids  medicines called MAO Inhibitors like Nardil, Parnate, Marplan, Eldepryl  medicines for allergies, asthma, cold, or cough  medicines for mental problems  medicines for weight loss  niacin  NSAIDs, medicines for pain and inflammation, like ibuprofen or naproxen  pentamidine  phenytoin  probenecid  quinolone antibiotics like ciprofloxacin, levofloxacin, ofloxacin  some herbal dietary supplements  steroid medicines like prednisone or cortisone  thyroid medicine  water pills or diuretics This list may not describe all possible interactions. Give your health care provider a list of all the medicines, herbs, non-prescription drugs, or dietary supplements you use. Also tell them if you smoke, drink alcohol, or use illegal drugs. Some items may interact with your medicine. What should I watch for while using this medicine? Visit your doctor or health care professional for regular checks on your progress. A test called the HbA1C (A1C) will be monitored. This is a simple blood test. It measures your blood sugar control over the last 2 to 3 months. You will receive this test every 3 to 6 months. Learn how to  check your blood sugar. Learn the symptoms of low and high blood sugar and how to manage them. Always carry a quick-source of sugar  with you in case you have symptoms of low blood sugar. Examples include hard sugar candy or glucose tablets. Make sure others know that you can choke if you eat or drink when you develop serious symptoms of low blood sugar, such as seizures or unconsciousness. They must get medical help at once. Tell your doctor or health care professional if you have high blood sugar. You might need to change the dose of your medicine. If you are sick or exercising more than usual, you might need to change the dose of your medicine. Do not skip meals. Ask your doctor or health care professional if you should avoid alcohol. Many nonprescription cough and cold products contain sugar or alcohol. These can affect blood sugar. This medicine can make you more sensitive to the sun. Keep out of the sun. If you cannot avoid being in the sun, wear protective clothing and use sunscreen. Do not use sun lamps or tanning beds/booths. Wear a medical ID bracelet or chain, and carry a card that describes your disease and details of your medicine and dosage times. This medicine may cause a decrease in Co-Enyzme Q-10. You should make sure that you get enough Co-Enzyme Q-10 while you are taking this medicine. Discuss the foods you eat and the vitamins you take with your health care professional. What side effects may I notice from receiving this medicine? Side effects that you should report to your doctor or health care professional as soon as possible:  allergic reactions like skin rash, itching or hives, swelling of the face, lips, or tongue  breathing problems  dark urine  fever, chills, sore throat  signs and symptoms of low blood sugar such as feeling anxious, confusion, dizziness, increased hunger, unusually weak or tired, sweating, shakiness, cold, irritable, headache, blurred vision, fast heartbeat, loss of consciousness  unusual bleeding or bruising  yellowing of the eyes or skin Side effects that usually do not require  medical attention (report to your doctor or health care professional if they continue or are bothersome):  diarrhea  dizziness  gas  headache  nausea  upset stomach This list may not describe all possible side effects. Call your doctor for medical advice about side effects. You may report side effects to FDA at 1-800-FDA-1088. Where should I keep my medicine? Keep out of the reach of children. Store at room temperature between 15 and 30 degrees C (59 and 86 degrees F). Throw away any unused medicine after the expiration date. NOTE: This sheet is a summary. It may not cover all possible information. If you have questions about this medicine, talk to your doctor, pharmacist, or health care provider.  2021 Elsevier/Gold Standard (2017-10-01 10:23:18)   Nonstress Test A nonstress test is a procedure that is done during pregnancy in order to check the baby's heartbeat. The procedure can help to show if the baby (fetus) is healthy. It is commonly done when:  The baby is past his or her due date.  The pregnancy is high risk.  The baby is moving less than normal.  The mother has lost a pregnancy in the past.  The health care provider suspects a problem with the baby's growth.  There is too much or too little amniotic fluid. The procedure is often done in the third trimester of pregnancy to find out if an early delivery is needed  and whether such a delivery is safe. During a nonstress test, the baby's heartbeat is monitored when the baby is resting and when the baby is moving. If the baby is healthy, the heart rate will increase when he or she moves or kicks and will return to normal when he or she rests. Tell a health care provider about:  Any allergies you have.  Any medical conditions you have.  All medicines you are taking, including vitamins, herbs, eye drops, creams, and over-the-counter medicines.  Any surgeries you have had.  Any past pregnancies you have had. What are  the risks? There are no risks to you or your baby from a nonstress test. This procedure should not be painful or uncomfortable. What happens before the procedure?  Eat a meal right before the test or as directed by your health care provider. Food may help encourage the baby to move.  Use the restroom right before the test. What happens during the procedure?  Two monitors will be placed on your abdomen. One will record the baby's heart rate and the other will record the contractions of your uterus.  You may be asked to lie down on your side or to sit upright.  You may be given a button to press when you feel your baby move.  Your health care provider will listen to your baby's heartbeat and record it. He or she may also watch your baby's heartbeat on a screen.  If the baby seems to be sleeping, you may be asked to drink some juice or soda, eat a snack, or change positions. The procedure may vary among health care providers and hospitals.   What can I expect after procedure?  Your health care provider will discuss the test results with you and make recommendations for the future. Depending on the results, your health care provider may order additional tests or another course of action.  If your health care provider gave you any diet or activity instructions, make sure to follow them.  Keep all follow-up visits. This is important. Summary  A nonstress test is a procedure that is done during pregnancy in order to check the baby's heartbeat. The procedure can help show if the baby is healthy.  The procedure is often done in the third trimester of pregnancy to find out if an early delivery is needed and whether such a delivery is safe.  During a nonstress test, the baby's heartbeat is monitored when the baby is resting and when the baby is moving. If the baby is healthy, the heart rate will increase when he or she moves or kicks and will return to normal when he or she rests.  Your health  care provider will discuss the test results with you and make recommendations for the future. This information is not intended to replace advice given to you by your health care provider. Make sure you discuss any questions you have with your health care provider. Document Revised: 06/12/2020 Document Reviewed: 06/12/2020 Elsevier Patient Education  2021 Elsevier Inc.   Fetal Movement Counts Patient Name: ________________________________________________ Patient Due Date: ____________________  What is a fetal movement count? A fetal movement count is the number of times that you feel your baby move during a certain amount of time. This may also be called a fetal kick count. A fetal movement count is recommended for every pregnant woman. You may be asked to start counting fetal movements as early as week 28 of your pregnancy. Pay attention to when your  baby is most active. You may notice your baby's sleep and wake cycles. You may also notice things that make your baby move more. You should do a fetal movement count:  When your baby is normally most active.  At the same time each day. A good time to count movements is while you are resting, after having something to eat and drink. How do I count fetal movements? 1. Find a quiet, comfortable area. Sit, or lie down on your side. 2. Write down the date, the start time and stop time, and the number of movements that you felt between those two times. Take this information with you to your health care visits. 3. Write down your start time when you feel the first movement. 4. Count kicks, flutters, swishes, rolls, and jabs. You should feel at least 10 movements. 5. You may stop counting after you have felt 10 movements, or if you have been counting for 2 hours. Write down the stop time. 6. If you do not feel 10 movements in 2 hours, contact your health care provider for further instructions. Your health care provider may want to do additional tests to  assess your baby's well-being. Contact a health care provider if:  You feel fewer than 10 movements in 2 hours.  Your baby is not moving like he or she usually does. Date: ____________ Start time: ____________ Stop time: ____________ Movements: ____________ Date: ____________ Start time: ____________ Stop time: ____________ Movements: ____________ Date: ____________ Start time: ____________ Stop time: ____________ Movements: ____________ Date: ____________ Start time: ____________ Stop time: ____________ Movements: ____________ Date: ____________ Start time: ____________ Stop time: ____________ Movements: ____________ Date: ____________ Start time: ____________ Stop time: ____________ Movements: ____________ Date: ____________ Start time: ____________ Stop time: ____________ Movements: ____________ Date: ____________ Start time: ____________ Stop time: ____________ Movements: ____________ Date: ____________ Start time: ____________ Stop time: ____________ Movements: ____________ This information is not intended to replace advice given to you by your health care provider. Make sure you discuss any questions you have with your health care provider. Document Revised: 04/22/2019 Document Reviewed: 04/22/2019 Elsevier Patient Education  2021 [employer].    Third Trimester of Pregnancy  The third trimester of pregnancy is from week 28 through week 40. This is also called months 7 through 9. This trimester is when your unborn baby (fetus) is growing very fast. At the end of the ninth month, the unborn baby is about 20 inches long. It weighs about 6-10 pounds. Body changes during your third trimester Your body continues to go through many changes during this time. The changes vary and generally return to normal after the baby is born. Physical changes  Your weight will continue to increase. You may gain 25-35 pounds (11-16 kg) by the end of the pregnancy. If you are underweight, you may gain  28-40 lb (about 13-18 kg). If you are overweight, you may gain 15-25 lb (about 7-11 kg).  You may start to get stretch marks on your hips, belly (abdomen), and breasts.  Your breasts will continue to grow and may hurt. A yellow fluid (colostrum) may leak from your breasts. This is the first milk you are making for your baby.  You may have changes in your hair.  Your belly button may stick out.  You may have more swelling in your hands, face, or ankles. Health changes  You may have heartburn.  You may have trouble pooping (constipation).  You may get hemorrhoids. These are swollen veins in the butt that can  itch or get painful.  You may have swollen veins (varicose veins) in your legs.  You may have more body aches in the pelvis, back, or thighs.  You may have more tingling or numbness in your hands, arms, and legs. The skin on your belly may also feel numb.  You may feel short of breath as your womb (uterus) gets bigger. Other changes  You may pee (urinate) more often.  You may have more problems sleeping.  You may notice the unborn baby "dropping," or moving lower in your belly.  You may have more discharge coming from your vagina.  Your joints may feel loose, and you may have pain around your pelvic bone. Follow these instructions at home: Medicines  Take over-the-counter and prescription medicines only as told by your doctor. Some medicines are not safe during pregnancy.  Take a prenatal vitamin that contains at least 600 micrograms (mcg) of folic acid. Eating and drinking  Eat healthy meals that include: ? Fresh fruits and vegetables. ? Whole grains. ? Good sources of protein, such as meat, eggs, or tofu. ? Low-fat dairy products.  Avoid raw meat and unpasteurized juice, milk, and cheese. These carry germs that can harm you and your baby.  Eat 4 or 5 small meals rather than 3 large meals a day.  You may need to take these actions to prevent or treat trouble  pooping: ? Drink enough fluids to keep your pee (urine) pale yellow. ? Eat foods that are high in fiber. These include beans, whole grains, and fresh fruits and vegetables. ? Limit foods that are high in fat and sugar. These include fried or sweet foods. Activity  Exercise only as told by your doctor. Stop exercising if you start to have cramps in your womb.  Avoid heavy lifting.  Do not exercise if it is too hot or too humid, or if you are in a place of great height (high altitude).  If you choose to, you may have sex unless your doctor tells you not to. Relieving pain and discomfort  Take breaks often, and rest with your legs raised (elevated) if you have leg cramps or low back pain.  Take warm water baths (sitz baths) to soothe pain or discomfort caused by hemorrhoids. Use hemorrhoid cream if your doctor approves.  Wear a good support bra if your breasts are tender.  If you develop bulging, swollen veins in your legs: ? Wear support hose as told by your doctor. ? Raise your feet for 15 minutes, 3-4 times a day. ? Limit salt in your food. Safety  Talk to your doctor before traveling far distances.  Do not use hot tubs, steam rooms, or saunas.  Wear your seat belt at all times when you are in a car.  Talk with your doctor if someone is hurting you or yelling at you a lot. Preparing for your baby's arrival To prepare for the arrival of your baby:  Take prenatal classes.  Visit the hospital and tour the maternity area.  Buy a rear-facing car seat. Learn how to install it in your car.  Prepare the baby's room. Take out all pillows and stuffed animals from the baby's crib. General instructions  Avoid cat litter boxes and soil used by cats. These carry germs that can cause harm to the baby and can cause a loss of your baby by miscarriage or stillbirth.  Do not douche or use tampons. Do not use scented sanitary pads.  Do not smoke or  use any products that contain nicotine  or tobacco. If you need help quitting, ask your doctor.  Do not drink alcohol.  Do not use herbal medicines, illegal drugs, or medicines that were not approved by your doctor. Chemicals in these products can affect your baby.  Keep all follow-up visits. This is important. Where to find more information  American Pregnancy Association: americanpregnancy.org  [employer]ians [employer] Gynec[employer]: www.acog.org  Office on Women's Health: [place].gov/pregnancy Contact a doctor if:  You have a fever.  You have mild cramps or pressure in your lower belly.  You have a nagging pain in your belly area.  You vomit, or you have watery poop (diarrhea).  You have bad-smelling fluid coming from your vagina.  You have pain when you pee, or your pee smells bad.  You have a headache that does not go away when you take medicine.  You have changes in how you see, or you see spots in front of your eyes. Get help right away if:  Your water breaks.  You have regular contractions that are less than 5 minutes apart.  You are spotting or bleeding from your vagina.  You have very bad belly cramps or pain.  You have trouble breathing.  You have chest pain.  You faint.  You have not felt the baby move for the amount of time told by your doctor.  You have new or increased pain, swelling, or redness in an arm or leg. Summary  The third trimester is from week 28 through week 40 (months 7 through 9). This is the time when your unborn baby is growing very fast.  During this time, your discomfort may increase as you gain weight and as your baby grows.  Get ready for your baby to arrive by taking prenatal classes, buying a rear-facing car seat, and preparing the baby's room.  Get help right away if you are bleeding from your vagina, you have chest pain and trouble breathing, or you have not felt the baby move for the amount of time told by your doctor. This information is  not intended to replace advice given to you by your health care provider. Make sure you discuss any questions you have with your health care provider. Document Revised: 02/09/2020 Document Reviewed: 12/16/2019 Elsevier Patient Education  2021 [employer]

## 2020-11-30 NOTE — Progress Notes (Signed)
I have seen, interviewed, and examined the patient in conjunction with the [employer] and affirm the diagnosis and management plan.   Gunnar Bulla, CNM [facility], [facility] 11/30/20 11:51 AM

## 2020-11-30 NOTE — Progress Notes (Signed)
ROB-Reports painful hand and foot swelling (L>R). Negative homan sign of exam with FROM in all extremities. Education regarding home treatment measures for symptoms and handouts given. Blood sugar log reviewed 13/24 readings elevated. Dr Logan Bores consulted and patient to start Glyburide 5 mg PO daily, follow GDM diet, and check blood glucose levels advised; patient agree with plan and verbalized understanding. Rx Glyburide, Macrobid, Lostrine; see orders. NST reactive, see chart. Anticipatory guidance regarding course of prenatal care included weekly antenatal testing-scheduled for MFM growth ultrasound on 12/05/2020. Reviewed red flag symptoms and when to call. RTC x 1 week for ROB and NST with ANNIE, and x 2 weeks for ROB and NST with JML or sooner if needed.  Juliann Pares, Student-MidWife [employer] 11/30/20 10:33 AM

## 2020-12-05 ENCOUNTER — Other Ambulatory Visit: Payer: Self-pay

## 2020-12-05 ENCOUNTER — Ambulatory Visit: Payer: Medicaid Other | Attending: Maternal & Fetal Medicine

## 2020-12-05 ENCOUNTER — Other Ambulatory Visit: Payer: Self-pay | Admitting: Certified Nurse Midwife

## 2020-12-05 DIAGNOSIS — O24415 Gestational diabetes mellitus in pregnancy, controlled by oral hypoglycemic drugs: Secondary | ICD-10-CM | POA: Insufficient documentation

## 2020-12-05 DIAGNOSIS — O24419 Gestational diabetes mellitus in pregnancy, unspecified control: Secondary | ICD-10-CM

## 2020-12-05 DIAGNOSIS — Z3A37 37 weeks gestation of pregnancy: Secondary | ICD-10-CM | POA: Diagnosis not present

## 2020-12-06 ENCOUNTER — Telehealth: Payer: Self-pay | Admitting: Obstetrics and Gynecology

## 2020-12-06 ENCOUNTER — Telehealth: Payer: Self-pay | Admitting: Certified Nurse Midwife

## 2020-12-06 DIAGNOSIS — O24419 Gestational diabetes mellitus in pregnancy, unspecified control: Secondary | ICD-10-CM

## 2020-12-06 DIAGNOSIS — Z3483 Encounter for supervision of other normal pregnancy, third trimester: Secondary | ICD-10-CM

## 2020-12-06 NOTE — Telephone Encounter (Signed)
Patient called back and asked to speak with you again.  Patient would not give me specifics and just told me it was about a conversation you guys had earlier this morning.  Please advise.

## 2020-12-06 NOTE — Telephone Encounter (Signed)
Telephone call to patient, verified name and date of birth.   Cesarean section schedule for Friday, December 08, 2020 at 0730 with Dr Valentino Saxon as surgeon and Jeralyn Bennett as assistant.   Patient agrees with plan of care. Patient unable to complete COVID testing today, plans tomorrow after visit.   Reviewed red flag symptoms and when to call. Keep appointment for NST and ROB tomorrow as previously scheduled.    Cheryl Wong, CNM [facility], [facility] 12/06/20 11:25 AM

## 2020-12-06 NOTE — Telephone Encounter (Signed)
Got C-Section for pt scheduled for 7:30 on 12-08-2020 with michelle's help! She is on the Board! I have blocked your schedule that morning until 9am- you had three pts that morning any suggestions on where to move the physical.

## 2020-12-06 NOTE — Telephone Encounter (Signed)
Telephone call to patient, verified full name and date of birth.   Discussed recommendations from MFM for primary cesarean section secondary to uncontrolled gestational diabetes.   Patient wishes to schedule surgery for Tuesday of next week.   Will schedule appointment for NST and ROB tomorrow.   CNM will check with Labor and Delivery for availability of preferred date.    Cheryl Wong, CNM [facility], [facility] 12/06/20 9:05 AM

## 2020-12-07 ENCOUNTER — Other Ambulatory Visit
Admission: RE | Admit: 2020-12-07 | Discharge: 2020-12-07 | Disposition: A | Discharge status: Home / Self Care | Payer: Medicaid Other | Source: Ambulatory Visit | Attending: Obstetrics and Gynecology | Admitting: Obstetrics and Gynecology

## 2020-12-07 ENCOUNTER — Other Ambulatory Visit: Payer: Medicaid Other

## 2020-12-07 ENCOUNTER — Ambulatory Visit (INDEPENDENT_AMBULATORY_CARE_PROVIDER_SITE_OTHER): Payer: Medicaid Other | Admitting: Certified Nurse Midwife

## 2020-12-07 ENCOUNTER — Other Ambulatory Visit: Payer: Self-pay

## 2020-12-07 VITALS — BP 121/64 | HR 99 | Wt 170.9 lb

## 2020-12-07 DIAGNOSIS — Z01812 Encounter for preprocedural laboratory examination: Secondary | ICD-10-CM | POA: Insufficient documentation

## 2020-12-07 DIAGNOSIS — Z3403 Encounter for supervision of normal first pregnancy, third trimester: Secondary | ICD-10-CM

## 2020-12-07 DIAGNOSIS — Z20822 Contact with and (suspected) exposure to covid-19: Secondary | ICD-10-CM | POA: Insufficient documentation

## 2020-12-07 DIAGNOSIS — Z3A37 37 weeks gestation of pregnancy: Secondary | ICD-10-CM | POA: Diagnosis not present

## 2020-12-07 DIAGNOSIS — O24415 Gestational diabetes mellitus in pregnancy, controlled by oral hypoglycemic drugs: Secondary | ICD-10-CM

## 2020-12-07 LAB — POCT URINALYSIS DIPSTICK OB
Bilirubin, UA: NEGATIVE
Blood, UA: NEGATIVE
Glucose, UA: NEGATIVE
Ketones, UA: NEGATIVE
Nitrite, UA: NEGATIVE
POC,PROTEIN,UA: NEGATIVE
Spec Grav, UA: 1.01 (ref 1.010–1.025)
Urobilinogen, UA: 0.2 E.U./dL
pH, UA: 7 (ref 5.0–8.0)

## 2020-12-07 LAB — SARS CORONAVIRUS 2 (TAT 6-24 HRS): SARS Coronavirus 2: NEGATIVE

## 2020-12-07 NOTE — Progress Notes (Signed)
ROB- Reports lower back pain and pressure, using home treatment measures. Taking glyburide as prescribed, six (6) out of twenty (20) blood sugars were high; not consistently checking blood sugars after meals. NST reactive today, see chart. Discussed the risk and benefits of cesarean section tomorrow. Answered questions and reassurance provided. Reviewed red flag symptoms and when to call. RTC on 4/1 for incision check.  Juliann Pares, Student-MidWife [employer] 12/07/20 1:43 PM

## 2020-12-07 NOTE — Patient Instructions (Addendum)
Cesarean Delivery Cesarean birth, or cesarean delivery, is the surgical delivery of a baby through an incision in the abdomen and the uterus. This may be referred to as a C-section. This procedure may be scheduled ahead of time, or it may be done in an emergency situation. Tell a health care provider about:  Any allergies you have.  All medicines you are taking, including vitamins, herbs, eye drops, creams, and over-the-counter medicines.  Any problems you or family members have had with anesthetic medicines.  Any blood disorders you have.  Any surgeries you have had.  Any medical conditions you have.  Whether you or any members of your family have a history of deep vein thrombosis (DVT) or pulmonary embolism (PE). What are the risks? Generally, this is a safe procedure. However, problems may occur, including:  Infection.  Bleeding.  Allergic reactions to medicines.  Damage to other structures or organs.  Blood clots.  Injury to your baby. What happens before the procedure? General instructions  Follow instructions from your health care provider about eating or drinking restrictions.  If you know that you are going to have a cesarean delivery, do not shave your pubic area. Shaving before the procedure may increase your risk of infection.  Plan to have someone take you home from the hospital.  Ask your health care provider what steps will be taken to prevent infection. These may include: ? Removing hair at the surgery site. ? Washing skin with a germ-killing soap. ? Taking antibiotic medicine.  Depending on the reason for your cesarean delivery, you may have a physical exam or additional testing, such as an ultrasound.  You may have your blood or urine tested. Questions for your health care provider  Ask your health care provider about: ? Changing or stopping your regular medicines. This is especially important if you are taking diabetes medicines or blood  thinners. ? Your pain management plan. This is especially important if you plan to breastfeed your baby. ? How long you will be in the hospital after the procedure. ? Any concerns you may have about receiving blood products, if you need them during the procedure. ? Cord blood banking, if you plan to collect your baby's umbilical cord blood.  You may also want to ask your health care provider: ? Whether you will be able to hold or breastfeed your baby while you are still in the operating room. ? Whether your baby can stay with you immediately after the procedure and during your recovery. ? Whether a family member or a person of your choice can go with you into the operating room and stay with you during the procedure, immediately after the procedure, and during your recovery. What happens during the procedure?  An IV will be inserted into one of your veins.  Fluid and medicines, such as antibiotics, will be given before the surgery.  Fetal monitors will be placed on your abdomen to check your baby's heart rate.  You may be given a special warming gown to wear to keep your temperature stable.  A catheter may be inserted into your bladder through your urethra. This drains your urine during the procedure.  You may be given one or more of the following: ? A medicine to numb the area (local anesthetic). ? A medicine to make you fall asleep (general anesthetic). ? A medicine (regional anesthetic) that is injected into your back or through a small thin tube placed in your back (spinal anesthetic or epidural anesthetic). This   numbs everything below the injection site and allows you to stay awake during your procedure. If this makes you feel nauseous, tell your health care provider. Medicines will be available to help reduce any nausea you may feel.  An incision will be made in your abdomen, and then in your uterus.  If you are awake during your procedure, you may feel tugging and pulling in your  abdomen, but you should not feel pain. If you feel pain, tell your health care provider immediately.  Your baby will be removed from your uterus. You may feel more pressure or pushing while this happens.  Immediately after birth, your baby will be dried and kept warm. You may be able to hold and breastfeed your baby.  The umbilical cord may be clamped and cut during this time. This usually occurs after waiting a period of 1-2 minutes after delivery.  Your placenta will be removed from your uterus.  Your incisions will be closed with stitches (sutures). Staples, skin glue, or adhesive strips may also be applied to the incision in your abdomen.  Bandages (dressings) may be placed over the incision in your abdomen. The procedure may vary among health care providers and hospitals.   What happens after the procedure?  Your blood pressure, heart rate, breathing rate, and blood oxygen level will be monitored until you are discharged from the hospital.  You may continue to receive fluids and medicines through an IV.  You will have some pain. Medicines will be available to help control your pain.  To help prevent blood clots: ? You may be given medicines. ? You may have to wear compression stockings or devices. ? You will be encouraged to walk around when you are able.  Hospital staff will encourage and support bonding with your baby. Your hospital may have you and your baby to stay in the same room (rooming in) during your hospital stay to encourage successful bonding and breastfeeding.  You may be encouraged to cough and breathe deeply often. This helps to prevent lung problems.  If you have a catheter draining your urine, it will be removed as soon as possible after your procedure. Summary  Cesarean birth, or cesarean delivery, is the surgical delivery of a baby through an incision in the abdomen and the uterus.  Follow instructions from your health care provider about eating or  drinking restrictions before the procedure.  You will have some pain after the procedure. Medicines will be available to help control your pain.  Hospital staff will encourage and support bonding with your baby after the procedure. Your hospital may have you and your baby to stay in the same room (rooming in) during your hospital stay to encourage successful bonding and breastfeeding. This information is not intended to replace advice given to you by your health care provider. Make sure you discuss any questions you have with your health care provider. Document Revised: 05/01/2020 Document Reviewed: 03/09/2018 Elsevier Patient Education  2021 [employer]   Fetal Movement Counts Patient Name: ________________________________________________ Patient Due Date: ____________________  What is a fetal movement count? A fetal movement count is the number of times that you feel your baby move during a certain amount of time. This may also be called a fetal kick count. A fetal movement count is recommended for every pregnant woman. You may be asked to start counting fetal movements as early as week 28 of your pregnancy. Pay attention to when your baby is most active. You may notice your  baby's sleep and wake cycles. You may also notice things that make your baby move more. You should do a fetal movement count: When your baby is normally most active. At the same time each day. A good time to count movements is while you are resting, after having something to eat and drink. How do I count fetal movements? Find a quiet, comfortable area. Sit, or lie down on your side. Write down the date, the start time and stop time, and the number of movements that you felt between those two times. Take this information with you to your health care visits. Write down your start time when you feel the first movement. Count kicks, flutters, swishes, rolls, and jabs. You should feel at least 10 movements. You may stop  counting after you have felt 10 movements, or if you have been counting for 2 hours. Write down the stop time. If you do not feel 10 movements in 2 hours, contact your health care provider for further instructions. Your health care provider may want to do additional tests to assess your baby's well-being. Contact a health care provider if: You feel fewer than 10 movements in 2 hours. Your baby is not moving like he or she usually does. Date: ____________ Start time: ____________ Stop time: ____________ Movements: ____________ Date: ____________ Start time: ____________ Stop time: ____________ Movements: ____________ Date: ____________ Start time: ____________ Stop time: ____________ Movements: ____________ Date: ____________ Start time: ____________ Stop time: ____________ Movements: ____________ Date: ____________ Start time: ____________ Stop time: ____________ Movements: ____________ Date: ____________ Start time: ____________ Stop time: ____________ Movements: ____________ Date: ____________ Start time: ____________ Stop time: ____________ Movements: ____________ Date: ____________ Start time: ____________ Stop time: ____________ Movements: ____________ Date: ____________ Start time: ____________ Stop time: ____________ Movements: ____________ This information is not intended to replace advice given to you by your health care provider. Make sure you discuss any questions you have with your health care provider. Document Revised: 04/22/2019 Document Reviewed: 04/22/2019 Elsevier Patient Education  2021 [employer].

## 2020-12-07 NOTE — Progress Notes (Signed)
ROB she was having lots of pain yesterday. She still has pain today but not as bad.

## 2020-12-07 NOTE — Progress Notes (Signed)
I have seen, interviewed, and examined the patient in conjunction with the [facility] [employer] [facility] and affirm the diagnosis and management plan.   Scheduled for primary cesarean section tomorrow at 0730 due to uncontrolled gestational diabetes with estimated fetal weight greater than 4500 grams.   Discussed possible SCN admission of infant after birth due to size and likely issue with blood sugar and temperature control.   The risks of surgery were discussed with the patient including but were not limited to: bleeding which may require transfusion or reoperation; infection which may require antibiotics; injury to bowel, bladder, ureters or other surrounding organs; injury to the fetus; need for additional procedures including hysterectomy in the event of a life-threatening hemorrhage; formation of adhesions; placental abnormalities with subsequent pregnancies; incisional problems; thromboembolic phenomenon and other postoperative/anesthesia complications. The patient concurred with the proposed plan, giving informed written consent for the procedure.  [name], CNM [facility], [facility] 12/07/20 10:11 AM

## 2020-12-08 ENCOUNTER — Encounter
Admission: EM | Disposition: A | Discharge status: Home / Self Care | Payer: Self-pay | Source: Home / Self Care | Attending: Obstetrics and Gynecology

## 2020-12-08 ENCOUNTER — Inpatient Hospital Stay: Payer: Medicaid Other | Admitting: Anesthesiology

## 2020-12-08 ENCOUNTER — Inpatient Hospital Stay
Admission: RE | Admit: 2020-12-08 | Payer: Medicaid Other | Source: Home / Self Care | Admitting: Obstetrics and Gynecology

## 2020-12-08 ENCOUNTER — Encounter: Payer: Self-pay | Admitting: Obstetrics and Gynecology

## 2020-12-08 ENCOUNTER — Inpatient Hospital Stay
Admission: EM | Admit: 2020-12-08 | Discharge: 2020-12-10 | DRG: 787 | Disposition: A | Discharge status: Home / Self Care | Payer: Medicaid Other | Attending: Obstetrics and Gynecology | Admitting: Obstetrics and Gynecology

## 2020-12-08 DIAGNOSIS — Z87440 Personal history of urinary (tract) infections: Secondary | ICD-10-CM

## 2020-12-08 DIAGNOSIS — O24425 Gestational diabetes mellitus in childbirth, controlled by oral hypoglycemic drugs: Secondary | ICD-10-CM | POA: Diagnosis present

## 2020-12-08 DIAGNOSIS — O9081 Anemia of the puerperium: Secondary | ICD-10-CM | POA: Diagnosis not present

## 2020-12-08 DIAGNOSIS — O24419 Gestational diabetes mellitus in pregnancy, unspecified control: Secondary | ICD-10-CM

## 2020-12-08 DIAGNOSIS — B951 Streptococcus, group B, as the cause of diseases classified elsewhere: Secondary | ICD-10-CM

## 2020-12-08 DIAGNOSIS — Z20822 Contact with and (suspected) exposure to covid-19: Secondary | ICD-10-CM | POA: Diagnosis present

## 2020-12-08 DIAGNOSIS — O99824 Streptococcus B carrier state complicating childbirth: Secondary | ICD-10-CM | POA: Diagnosis present

## 2020-12-08 DIAGNOSIS — IMO0001 Reserved for inherently not codable concepts without codable children: Secondary | ICD-10-CM

## 2020-12-08 DIAGNOSIS — Z3A37 37 weeks gestation of pregnancy: Secondary | ICD-10-CM

## 2020-12-08 DIAGNOSIS — O3663X0 Maternal care for excessive fetal growth, third trimester, not applicable or unspecified: Principal | ICD-10-CM | POA: Diagnosis present

## 2020-12-08 DIAGNOSIS — O3660X0 Maternal care for excessive fetal growth, unspecified trimester, not applicable or unspecified: Secondary | ICD-10-CM | POA: Diagnosis present

## 2020-12-08 DIAGNOSIS — Z9114 Patient's other noncompliance with medication regimen: Secondary | ICD-10-CM

## 2020-12-08 DIAGNOSIS — Z3A32 32 weeks gestation of pregnancy: Secondary | ICD-10-CM | POA: Diagnosis not present

## 2020-12-08 DIAGNOSIS — O0930 Supervision of pregnancy with insufficient antenatal care, unspecified trimester: Secondary | ICD-10-CM

## 2020-12-08 DIAGNOSIS — O24415 Gestational diabetes mellitus in pregnancy, controlled by oral hypoglycemic drugs: Secondary | ICD-10-CM | POA: Diagnosis not present

## 2020-12-08 LAB — RAPID HIV SCREEN (HIV 1/2 AB+AG)
HIV 1/2 Antibodies: NONREACTIVE
HIV-1 P24 Antigen - HIV24: NONREACTIVE

## 2020-12-08 LAB — CBC
HCT: 33.7 % — ABNORMAL LOW (ref 36.0–46.0)
Hemoglobin: 11 g/dL — ABNORMAL LOW (ref 12.0–15.0)
MCH: 27.2 pg (ref 26.0–34.0)
MCHC: 32.6 g/dL (ref 30.0–36.0)
MCV: 83.2 fL (ref 80.0–100.0)
Platelets: 217 10*3/uL (ref 150–400)
RBC: 4.05 MIL/uL (ref 3.87–5.11)
RDW: 13.2 % (ref 11.5–15.5)
WBC: 10 10*3/uL (ref 4.0–10.5)
nRBC: 0 % (ref 0.0–0.2)

## 2020-12-08 LAB — HEMOGLOBIN A1C
Hgb A1c MFr Bld: 5.7 % — ABNORMAL HIGH (ref 4.8–5.6)
Mean Plasma Glucose: 116.89 mg/dL

## 2020-12-08 LAB — GLUCOSE, CAPILLARY: Glucose-Capillary: 75 mg/dL (ref 70–99)

## 2020-12-08 SURGERY — Surgical Case
Anesthesia: Spinal

## 2020-12-08 MED ORDER — KETOROLAC TROMETHAMINE 30 MG/ML IJ SOLN
30.0000 mg | Freq: Four times a day (QID) | INTRAMUSCULAR | Status: AC
Start: 2020-12-08 — End: 2020-12-09
  Administered 2020-12-08 – 2020-12-09 (×3): 30 mg via INTRAVENOUS
  Filled 2020-12-08 (×3): qty 1

## 2020-12-08 MED ORDER — DIPHENHYDRAMINE HCL 50 MG/ML IJ SOLN: 12.5000 mg | INTRAMUSCULAR | Status: DC | PRN

## 2020-12-08 MED ORDER — ACETAMINOPHEN 500 MG PO TABS
1000.0000 mg | ORAL_TABLET | Freq: Four times a day (QID) | ORAL | Status: DC
  Administered 2020-12-09: 500 mg via ORAL
  Administered 2020-12-09 – 2020-12-10 (×3): 1000 mg via ORAL
  Filled 2020-12-08 (×6): qty 2

## 2020-12-08 MED ORDER — DEXAMETHASONE SODIUM PHOSPHATE 10 MG/ML IJ SOLN
INTRAMUSCULAR | Status: DC | PRN
  Administered 2020-12-08: 10 mg via INTRAVENOUS

## 2020-12-08 MED ORDER — KETOROLAC TROMETHAMINE 30 MG/ML IJ SOLN
INTRAMUSCULAR | Status: DC | PRN
  Administered 2020-12-08: 30 mg via INTRAVENOUS

## 2020-12-08 MED ORDER — COCONUT OIL OIL: 1.0000 "application " | TOPICAL_OIL | Status: DC | PRN

## 2020-12-08 MED ORDER — MISOPROSTOL 200 MCG PO TABS: 800.0000 ug | ORAL_TABLET | Freq: Once | ORAL | Status: AC

## 2020-12-08 MED ORDER — OXYCODONE HCL 5 MG PO TABS: 5.0000 mg | ORAL_TABLET | Freq: Four times a day (QID) | ORAL | Status: AC | PRN

## 2020-12-08 MED ORDER — ACETAMINOPHEN 325 MG PO TABS
650.0000 mg | ORAL_TABLET | Freq: Four times a day (QID) | ORAL | Status: AC
  Administered 2020-12-08 – 2020-12-09 (×4): 650 mg via ORAL
  Filled 2020-12-08 (×4): qty 2

## 2020-12-08 MED ORDER — MAGNESIUM HYDROXIDE 400 MG/5ML PO SUSP
30.0000 mL | ORAL | Status: DC | PRN
Start: 2020-12-08 — End: 2020-12-11
  Filled 2020-12-08: qty 30

## 2020-12-08 MED ORDER — WITCH HAZEL-GLYCERIN EX PADS: 1.0000 "application " | MEDICATED_PAD | CUTANEOUS | Status: DC | PRN

## 2020-12-08 MED ORDER — FENTANYL CITRATE (PF) 100 MCG/2ML IJ SOLN
INTRAMUSCULAR | Status: DC | PRN
  Administered 2020-12-08: 15 ug via INTRATHECAL

## 2020-12-08 MED ORDER — ONDANSETRON HCL 4 MG/2ML IJ SOLN
INTRAMUSCULAR | Status: DC | PRN
  Administered 2020-12-08: 4 mg via INTRAVENOUS

## 2020-12-08 MED ORDER — SODIUM CHLORIDE (PF) 0.9 % IJ SOLN
INTRAMUSCULAR | Status: AC
  Filled 2020-12-08: qty 50

## 2020-12-08 MED ORDER — SOD CITRATE-CITRIC ACID 500-334 MG/5ML PO SOLN
30.0000 mL | ORAL | Status: AC
  Administered 2020-12-08: 30 mL via ORAL
  Filled 2020-12-08: qty 15

## 2020-12-08 MED ORDER — GABAPENTIN 300 MG PO CAPS
300.0000 mg | ORAL_CAPSULE | Freq: Two times a day (BID) | ORAL | Status: DC
  Administered 2020-12-08 – 2020-12-10 (×4): 300 mg via ORAL
  Filled 2020-12-08 (×4): qty 1

## 2020-12-08 MED ORDER — OXYTOCIN-SODIUM CHLORIDE 30-0.9 UT/500ML-% IV SOLN
INTRAVENOUS | Status: AC
  Filled 2020-12-08: qty 500

## 2020-12-08 MED ORDER — IBUPROFEN 800 MG PO TABS
800.0000 mg | ORAL_TABLET | Freq: Four times a day (QID) | ORAL | Status: DC
  Administered 2020-12-10 (×2): 800 mg via ORAL
  Filled 2020-12-08 (×2): qty 1

## 2020-12-08 MED ORDER — OXYCODONE HCL 5 MG PO TABS: 5.0000 mg | ORAL_TABLET | ORAL | Status: DC | PRN

## 2020-12-08 MED ORDER — MEASLES, MUMPS & RUBELLA VAC IJ SOLR
0.5000 mL | Freq: Once | INTRAMUSCULAR | Status: DC
  Filled 2020-12-08: qty 0.5

## 2020-12-08 MED ORDER — OXYTOCIN-SODIUM CHLORIDE 30-0.9 UT/500ML-% IV SOLN
2.5000 [IU]/h | INTRAVENOUS | Status: AC
  Administered 2020-12-09: 2.5 [IU]/h via INTRAVENOUS
  Filled 2020-12-08: qty 500

## 2020-12-08 MED ORDER — ACETAMINOPHEN 500 MG PO TABS: 1000.0000 mg | ORAL_TABLET | Freq: Four times a day (QID) | ORAL | Status: DC

## 2020-12-08 MED ORDER — NALBUPHINE HCL 10 MG/ML IJ SOLN: 5.0000 mg | Freq: Once | INTRAMUSCULAR | Status: DC | PRN

## 2020-12-08 MED ORDER — TRAMADOL HCL 50 MG PO TABS
50.0000 mg | ORAL_TABLET | Freq: Four times a day (QID) | ORAL | Status: DC | PRN
Start: 2020-12-08 — End: 2020-12-11
  Filled 2020-12-08: qty 1

## 2020-12-08 MED ORDER — GLYCOPYRROLATE 0.2 MG/ML IJ SOLN
INTRAMUSCULAR | Status: AC
  Filled 2020-12-08: qty 1

## 2020-12-08 MED ORDER — NALBUPHINE HCL 10 MG/ML IJ SOLN: 5.0000 mg | INTRAMUSCULAR | Status: DC | PRN

## 2020-12-08 MED ORDER — FENTANYL CITRATE (PF) 100 MCG/2ML IJ SOLN
INTRAMUSCULAR | Status: AC
  Filled 2020-12-08: qty 2

## 2020-12-08 MED ORDER — PRENATAL MULTIVITAMIN CH
1.0000 | ORAL_TABLET | Freq: Every day | ORAL | Status: DC
  Administered 2020-12-09 – 2020-12-10 (×2): 1 via ORAL
  Filled 2020-12-08 (×2): qty 1

## 2020-12-08 MED ORDER — LACTATED RINGERS IV SOLN: INTRAVENOUS | Status: DC | PRN

## 2020-12-08 MED ORDER — MISOPROSTOL 200 MCG PO TABS
ORAL_TABLET | ORAL | Status: AC
  Administered 2020-12-08: 800 ug via RECTAL
  Filled 2020-12-08: qty 4

## 2020-12-08 MED ORDER — IBUPROFEN 800 MG PO TABS: 800.0000 mg | ORAL_TABLET | Freq: Four times a day (QID) | ORAL | Status: DC

## 2020-12-08 MED ORDER — MENTHOL 3 MG MT LOZG
1.0000 | LOZENGE | OROMUCOSAL | Status: DC | PRN
  Filled 2020-12-08: qty 9

## 2020-12-08 MED ORDER — KETOROLAC TROMETHAMINE 30 MG/ML IJ SOLN
30.0000 mg | Freq: Four times a day (QID) | INTRAMUSCULAR | Status: AC
  Administered 2020-12-09 (×2): 30 mg via INTRAVENOUS
  Filled 2020-12-08 (×2): qty 1

## 2020-12-08 MED ORDER — SODIUM CHLORIDE 0.9 % IV SOLN
INTRAVENOUS | Status: DC | PRN
  Administered 2020-12-08: 50 ug/min via INTRAVENOUS

## 2020-12-08 MED ORDER — KETOROLAC TROMETHAMINE 30 MG/ML IJ SOLN: 30.0000 mg | Freq: Once | INTRAMUSCULAR | Status: AC

## 2020-12-08 MED ORDER — ZOLPIDEM TARTRATE 5 MG PO TABS: 5.0000 mg | ORAL_TABLET | Freq: Every evening | ORAL | Status: DC | PRN

## 2020-12-08 MED ORDER — NALBUPHINE HCL 10 MG/ML IJ SOLN
5.0000 mg | Freq: Once | INTRAMUSCULAR | Status: DC | PRN
Start: 2020-12-08 — End: 2020-12-11

## 2020-12-08 MED ORDER — DIPHENHYDRAMINE HCL 25 MG PO CAPS: 25.0000 mg | ORAL_CAPSULE | Freq: Four times a day (QID) | ORAL | Status: DC | PRN

## 2020-12-08 MED ORDER — NALOXONE HCL 4 MG/10ML IJ SOLN
1.0000 ug/kg/h | INTRAMUSCULAR | Status: DC | PRN
  Filled 2020-12-08: qty 5

## 2020-12-08 MED ORDER — MEPERIDINE HCL 50 MG/ML IJ SOLN: 6.2500 mg | INTRAMUSCULAR | Status: DC | PRN

## 2020-12-08 MED ORDER — BUPIVACAINE IN DEXTROSE 0.75-8.25 % IT SOLN
INTRATHECAL | Status: DC | PRN
  Administered 2020-12-08: 1.6 mL via INTRATHECAL

## 2020-12-08 MED ORDER — TRANEXAMIC ACID-NACL 1000-0.7 MG/100ML-% IV SOLN
1000.0000 mg | INTRAVENOUS | Status: AC
  Filled 2020-12-08: qty 100

## 2020-12-08 MED ORDER — LACTATED RINGERS IV BOLUS
1000.0000 mL | Freq: Once | INTRAVENOUS | Status: AC
  Administered 2020-12-08: 1000 mL via INTRAVENOUS

## 2020-12-08 MED ORDER — FENTANYL CITRATE (PF) 100 MCG/2ML IJ SOLN: 25.0000 ug | INTRAMUSCULAR | Status: DC | PRN

## 2020-12-08 MED ORDER — CEFAZOLIN SODIUM-DEXTROSE 2-4 GM/100ML-% IV SOLN
2.0000 g | INTRAVENOUS | Status: AC
  Administered 2020-12-08: 2 g via INTRAVENOUS
  Filled 2020-12-08: qty 100

## 2020-12-08 MED ORDER — MORPHINE SULFATE (PF) 0.5 MG/ML IJ SOLN
INTRAMUSCULAR | Status: DC | PRN
  Administered 2020-12-08: .1 mg via INTRATHECAL

## 2020-12-08 MED ORDER — NALOXONE HCL 0.4 MG/ML IJ SOLN: 0.4000 mg | INTRAMUSCULAR | Status: DC | PRN

## 2020-12-08 MED ORDER — KETOROLAC TROMETHAMINE 30 MG/ML IJ SOLN
INTRAMUSCULAR | Status: AC
  Filled 2020-12-08: qty 1

## 2020-12-08 MED ORDER — LIDOCAINE HCL (PF) 1 % IJ SOLN
INTRAMUSCULAR | Status: DC | PRN
  Administered 2020-12-08 (×2): 2 mL via SUBCUTANEOUS

## 2020-12-08 MED ORDER — DEXAMETHASONE SODIUM PHOSPHATE 10 MG/ML IJ SOLN
INTRAMUSCULAR | Status: AC
  Filled 2020-12-08: qty 1

## 2020-12-08 MED ORDER — OXYCODONE HCL 5 MG/5ML PO SOLN
5.0000 mg | Freq: Once | ORAL | Status: DC | PRN
  Filled 2020-12-08: qty 5

## 2020-12-08 MED ORDER — SIMETHICONE 80 MG PO CHEW
80.0000 mg | CHEWABLE_TABLET | ORAL | Status: DC | PRN
  Administered 2020-12-09 – 2020-12-10 (×7): 80 mg via ORAL
  Filled 2020-12-08 (×7): qty 1

## 2020-12-08 MED ORDER — TRANEXAMIC ACID 1000 MG/10ML IV SOLN
INTRAVENOUS | Status: AC
  Administered 2020-12-08: 1000 mg
  Filled 2020-12-08: qty 10

## 2020-12-08 MED ORDER — BUPIVACAINE HCL (PF) 0.5 % IJ SOLN
INTRAMUSCULAR | Status: AC
  Filled 2020-12-08: qty 30

## 2020-12-08 MED ORDER — EPHEDRINE SULFATE-NACL 50-0.9 MG/10ML-% IV SOSY
PREFILLED_SYRINGE | INTRAVENOUS | Status: DC | PRN
  Administered 2020-12-08 (×4): 5 mg via INTRAVENOUS

## 2020-12-08 MED ORDER — OXYCODONE HCL 5 MG PO TABS
5.0000 mg | ORAL_TABLET | ORAL | Status: DC | PRN
  Administered 2020-12-09 – 2020-12-10 (×2): 10 mg via ORAL
  Administered 2020-12-10: 5 mg via ORAL
  Administered 2020-12-10 (×2): 10 mg via ORAL
  Filled 2020-12-08 (×2): qty 2
  Filled 2020-12-08: qty 1
  Filled 2020-12-08 (×2): qty 2

## 2020-12-08 MED ORDER — FERROUS SULFATE 325 (65 FE) MG PO TABS
325.0000 mg | ORAL_TABLET | Freq: Two times a day (BID) | ORAL | Status: DC
  Administered 2020-12-08 – 2020-12-10 (×5): 325 mg via ORAL
  Filled 2020-12-08 (×5): qty 1

## 2020-12-08 MED ORDER — OXYCODONE HCL 5 MG PO TABS: 5.0000 mg | ORAL_TABLET | Freq: Once | ORAL | Status: DC | PRN

## 2020-12-08 MED ORDER — LIDOCAINE HCL (PF) 2 % IJ SOLN
INTRAMUSCULAR | Status: AC
  Filled 2020-12-08: qty 5

## 2020-12-08 MED ORDER — LACTATED RINGERS IV SOLN: INTRAVENOUS | Status: DC

## 2020-12-08 MED ORDER — HYDROMORPHONE HCL 1 MG/ML IJ SOLN
1.0000 mg | INTRAMUSCULAR | Status: DC | PRN
Start: 2020-12-08 — End: 2020-12-11

## 2020-12-08 MED ORDER — SENNOSIDES-DOCUSATE SODIUM 8.6-50 MG PO TABS
2.0000 | ORAL_TABLET | Freq: Every day | ORAL | Status: DC
  Administered 2020-12-09 – 2020-12-10 (×2): 2 via ORAL
  Filled 2020-12-08 (×2): qty 2

## 2020-12-08 MED ORDER — MORPHINE SULFATE (PF) 0.5 MG/ML IJ SOLN
INTRAMUSCULAR | Status: AC
  Filled 2020-12-08: qty 10

## 2020-12-08 MED ORDER — BUPIVACAINE LIPOSOME 1.3 % IJ SUSP
INTRAMUSCULAR | Status: AC
  Filled 2020-12-08: qty 20

## 2020-12-08 MED ORDER — KETOROLAC TROMETHAMINE 30 MG/ML IJ SOLN: 30.0000 mg | Freq: Four times a day (QID) | INTRAMUSCULAR | Status: AC

## 2020-12-08 MED ORDER — GLYCOPYRROLATE 0.2 MG/ML IJ SOLN
INTRAMUSCULAR | Status: DC | PRN
  Administered 2020-12-08: .2 mg via INTRAVENOUS

## 2020-12-08 MED ORDER — OXYTOCIN-SODIUM CHLORIDE 30-0.9 UT/500ML-% IV SOLN
INTRAVENOUS | Status: AC
  Administered 2020-12-08: 2.5 [IU]/h via INTRAVENOUS
  Filled 2020-12-08: qty 500

## 2020-12-08 MED ORDER — KETOROLAC TROMETHAMINE 30 MG/ML IJ SOLN: 30.0000 mg | Freq: Four times a day (QID) | INTRAMUSCULAR | Status: DC

## 2020-12-08 MED ORDER — PHENYLEPHRINE HCL (PRESSORS) 10 MG/ML IV SOLN
INTRAVENOUS | Status: AC
  Filled 2020-12-08: qty 1

## 2020-12-08 MED ORDER — OXYTOCIN-SODIUM CHLORIDE 30-0.9 UT/500ML-% IV SOLN
INTRAVENOUS | Status: DC | PRN
  Administered 2020-12-08: 250 mL/h via INTRAVENOUS

## 2020-12-08 MED ORDER — SODIUM CHLORIDE 0.9% FLUSH: 3.0000 mL | INTRAVENOUS | Status: DC | PRN

## 2020-12-08 MED ORDER — DIPHENHYDRAMINE HCL 25 MG PO CAPS: 25.0000 mg | ORAL_CAPSULE | ORAL | Status: DC | PRN

## 2020-12-08 MED ORDER — DIBUCAINE (PERIANAL) 1 % EX OINT: 1.0000 "application " | TOPICAL_OINTMENT | CUTANEOUS | Status: DC | PRN

## 2020-12-08 MED ORDER — ONDANSETRON HCL 4 MG/2ML IJ SOLN: 4.0000 mg | Freq: Three times a day (TID) | INTRAMUSCULAR | Status: DC | PRN

## 2020-12-08 MED ORDER — TETANUS-DIPHTH-ACELL PERTUSSIS 5-2.5-18.5 LF-MCG/0.5 IM SUSY
0.5000 mL | PREFILLED_SYRINGE | Freq: Once | INTRAMUSCULAR | Status: DC
  Filled 2020-12-08: qty 0.5

## 2020-12-08 MED ORDER — MEPERIDINE HCL 25 MG/ML IJ SOLN: 6.2500 mg | INTRAMUSCULAR | Status: DC | PRN

## 2020-12-08 MED ORDER — ONDANSETRON HCL 4 MG/2ML IJ SOLN
INTRAMUSCULAR | Status: AC
  Filled 2020-12-08: qty 2

## 2020-12-08 SURGICAL SUPPLY — 33 items
BAG COUNTER SPONGE EZ (MISCELLANEOUS) ×2 IMPLANT
BENZOIN TINCTURE PRP APPL 2/3 (GAUZE/BANDAGES/DRESSINGS) ×2 IMPLANT
BNDG TENSOPLAST 6X5 (GAUZE/BANDAGES/DRESSINGS) ×2 IMPLANT
CANISTER SUCT 3000ML PPV (MISCELLANEOUS) ×2 IMPLANT
CHLORAPREP W/TINT 26 (MISCELLANEOUS) ×4 IMPLANT
CLOSURE STERI STRIP 1/2 X4 (GAUZE/BANDAGES/DRESSINGS) ×2 IMPLANT
COVER WAND RF STERILE (DRAPES) ×2 IMPLANT
DRSG TELFA 3X8 NADH (GAUZE/BANDAGES/DRESSINGS) ×2 IMPLANT
ELECT REM PT RETURN 9FT ADLT (ELECTROSURGICAL) ×2
ELECTRODE REM PT RTRN 9FT ADLT (ELECTROSURGICAL) ×1 IMPLANT
EXTRT SYSTEM ALEXIS 17CM (MISCELLANEOUS)
GAUZE SPONGE 4X4 12PLY STRL (GAUZE/BANDAGES/DRESSINGS) ×2 IMPLANT
GLOVE SURG ENC MOIS LTX SZ6.5 (GLOVE) ×2 IMPLANT
GLOVE SURG UNDER LTX SZ7 (GLOVE) ×2 IMPLANT
GOWN STRL REUS W/ TWL LRG LVL3 (GOWN DISPOSABLE) ×2 IMPLANT
GOWN STRL REUS W/TWL LRG LVL3 (GOWN DISPOSABLE) ×2
KIT TURNOVER KIT A (KITS) ×2 IMPLANT
MANIFOLD NEPTUNE II (INSTRUMENTS) ×2 IMPLANT
MAT PREVALON FULL STRYKER (MISCELLANEOUS) ×4 IMPLANT
NS IRRIG 1000ML POUR BTL (IV SOLUTION) ×2 IMPLANT
PACK C SECTION AR (MISCELLANEOUS) ×2 IMPLANT
PAD OB MATERNITY 4.3X12.25 (PERSONAL CARE ITEMS) ×2 IMPLANT
PAD PREP 24X41 OB/GYN DISP (PERSONAL CARE ITEMS) ×2 IMPLANT
PAD TELFA 2X3 NADH STRL (GAUZE/BANDAGES/DRESSINGS) ×2 IMPLANT
PENCIL SMOKE EVACUATOR (MISCELLANEOUS) ×2 IMPLANT
RETRACTOR WND ALEXIS-O 25 LRG (MISCELLANEOUS) ×1 IMPLANT
RTRCTR WOUND ALEXIS O 25CM LRG (MISCELLANEOUS) ×2
SUT MNCRL AB 4-0 PS2 18 (SUTURE) ×2 IMPLANT
SUT PLAIN 2 0 XLH (SUTURE) IMPLANT
SUT VIC AB 0 CT1 36 (SUTURE) ×8 IMPLANT
SUT VIC AB 3-0 SH 27 (SUTURE) ×1
SUT VIC AB 3-0 SH 27X BRD (SUTURE) ×1 IMPLANT
SYSTEM CONTND EXTRCTN KII BLLN (MISCELLANEOUS) IMPLANT

## 2020-12-08 NOTE — Lactation Note (Signed)
This note was copied from a baby's chart. Lactation Consultation Note  Patient Name: Cheryl Wong Today's Date: 12/08/2020 Reason for consult: L&D Initial assessment;1st time breastfeeding;Term;Early term 37-38.6wks;Other (Comment) (mom GDM) Age:27 hours  Maternal Data Does the patient have breastfeeding experience prior to this delivery?: Yes How long did the patient breastfeed?: 2 days  Feeding Mother's Current Feeding Choice: Breast Milk  LATCH Score Latch: Grasps breast easily, tongue down, lips flanged, rhythmical sucking.  Audible Swallowing: A few with stimulation  Type of Nipple: Everted at rest and after stimulation  Comfort (Breast/Nipple): Soft / non-tender  Hold (Positioning): Full assist, staff holds infant at breast  LATCH Score: 7   Lactation Tools Discussed/Used    Interventions Interventions: Breast feeding basics reviewed;Assisted with latch;Skin to skin;Breast compression;Adjust position  Discharge    Consult Status Consult Status: Follow-up Date: 12/08/20 Follow-up type: In-patient    Dyann Kief 12/08/2020, 11:07 AM

## 2020-12-08 NOTE — Transfer of Care (Signed)
Immediate Anesthesia Transfer of Care Note  Patient: Cheryl Wong  Procedure(s) Performed: CESAREAN SECTION (N/A )  Patient Location: Mother/Baby  Anesthesia Type:Spinal  Level of Consciousness: awake, alert  and oriented  Airway & Oxygen Therapy: Patient Spontanous Breathing  Post-op Assessment: Report given to RN and Post -op Vital signs reviewed and stable  Post vital signs: Reviewed and stable  Last Vitals:  Vitals Value Taken Time  BP 95/51 12/08/20 0938  Temp    Pulse 94 12/08/20 0938  Resp 16 12/08/20 0938  SpO2 99 % 12/08/20 0938    Last Pain:  Vitals:   12/08/20 0938  TempSrc:   PainSc: 0-No pain         Complications: No complications documented.

## 2020-12-08 NOTE — Anesthesia Procedure Notes (Signed)
Spinal  Patient location during procedure: OR Start time: 12/08/2020 7:50 AM End time: 12/08/2020 8:05 AM Reason for block: surgical anesthesia Staffing Performed: anesthesiologist and resident/CRNA  Anesthesiologist: Lanyiah Brix, Precious Haws, MD Resident/CRNA: Tollie Eth, CRNA Preanesthetic Checklist Completed: patient identified, IV checked, site marked, risks and benefits discussed, surgical consent, monitors and equipment checked, pre-op evaluation and timeout performed Spinal Block Patient position: sitting Prep: ChloraPrep Patient monitoring: heart rate, continuous pulse ox, blood pressure and cardiac monitor Approach: midline Location: L3-4 Injection technique: single-shot Needle Needle type: Whitacre and Introducer  Needle gauge: 24 G Needle length: 9 cm Assessment Sensory level: T5. Events: CSF return Additional Notes First attempts by CRNA, final attempt by MD  Patient was very emotional and exquisitely tender during the procedure   Negative paresthesia. Negative blood return. Positive free-flowing CSF. Expiration date of kit checked and confirmed. Patient tolerated procedure well, without complications.

## 2020-12-08 NOTE — Lactation Note (Signed)
This note was copied from a baby's chart. Lactation Consultation Note  Patient Name: Cheryl Wong Today's Date: 12/08/2020 Reason for consult: Follow-up assessment Age:27 hours  Maternal Data  mom wanting to give baby formula because baby is crying  Feeding Mother's Current Feeding Choice: Breast Milk Baby had fed on one breast at 1700, mom encouraged to offer 2nd breast at a feeding, assisted mom with latching baby to right breast in cradle hold and positioned with pillows, baby passing gas and mom encouraged that this is normal and nursing is comforting to baby, mom encouraged to offer breast when baby shows interest in feeding and this may be frequently.  Nursing frequently encourages milk production.      LATCH Score Latch: Grasps breast easily, tongue down, lips flanged, rhythmical sucking.  Audible Swallowing: A few with stimulation  Type of Nipple: Everted at rest and after stimulation  Comfort (Breast/Nipple): Soft / non-tender  Hold (Positioning): Assistance needed to correctly position infant at breast and maintain latch.  LATCH Score: 8   Lactation Tools Discussed/Used  Reviewed normal feeding patterns in new babies, how to know if baby is getting enough, normal output  Interventions Interventions: Skin to skin;Assisted with latch;Adjust position  Discharge    Consult Status Consult Status: Follow-up Date: 12/09/20 Follow-up type: In-patient    Dyann Kief 12/08/2020, 6:37 PM

## 2020-12-08 NOTE — Op Note (Signed)
Cesarean Section Procedure Note  Indications: suspected macrosomia and patient declines vag del attempt, gestational diabetes poorly controlled on oral meds  Pre-operative Diagnosis: 37 week 5 day pregnancy, suspected fetal macrosomia, declines vaginal attempt, poorly controlled gestational diabetes on oral meds.  Post-operative Diagnosis: same, with postpartum hemorrhage  Surgeon: Hildred Laser, MD  Assistants:  [name], CNM.  An experienced assistant was required given the standard of surgical care given the complexity of the case.  This assistant was needed for exposure, dissection, suctioning, retraction, instrument exchange, and for overall help during the procedure.  Procedure: Primary low transverse Cesarean Section  Anesthesia: Spinal anesthesia  Procedure Details: The patient was seen in the Holding Room. The risks, benefits, complications, treatment options, and expected outcomes were discussed with the patient.  The patient concurred with the proposed plan, giving informed consent.  The site of surgery properly noted/marked. The patient was taken to the Operating Room, identified as Cheryl Wong and the procedure verified as C-Section Delivery.   After induction of anesthesia, the patient was draped and prepped in the usual sterile manner. A Time Out was held and the above information confirmed. Anesthesia was tested and noted to be adequate. A Pfannenstiel incision was made and carried down through the subcutaneous tissue to the fascia. Fascial incision was made and extended transversely. The fascia was separated from the underlying rectus tissue superiorly and inferiorly. The peritoneum was identified and entered. Peritoneal incision was extended longitudinally. The surgical assist was able to provide retraction to allow for clear visualization of surgical site. The utero-vesical peritoneal reflection was incised transversely and the bladder flap was bluntly freed from the  lower uterine segment. A low transverse uterine incision was made. The anterior placenta was encountered.  Amniotic sac was then identified and amniotomy was performed with clear fluid. Delivered from cephalic presentation was a 4500 gram female with Apgar scores of 8 at one minute and 9 at five minutes.  The assistant was able to apply adequate fundal pressure to allow for successful delivery of the fetus. After the umbilical cord was clamped and cut, cord blood was obtained for evaluation. The placenta was removed intact and appeared normal. The uterus was exteriorized and cleared of all clots and debris. The uterine outline, tubes and ovaries appeared normal.  The uterus was noted to be boggy, however began to firm after uterine massage was administered. The uterine incision was closed with running locked sutures of 0-Vicryl.  A second suture of 0-Vicryl was used in an imbricating layer.  Hemostasis was observed.  The uterus was returned to the abdomen.  The posterior cul-de-sac and peri-colic gutters were cleared of all clots and debris.  The muscle layer was reapproximated with a figure-of-eight stitch of 3-0 Vicryl. The fascia was then grasped with Kocher clamps and injected with 60 ml of 1.3% Exparel solution (diluted in normal saline). The fascia was then reapproximated with a running suture of 0-Vicryl. The subcutaneous fat layer was reapproximated with 2-0 Vicryl. The skin was reapproximated with 4-0 Monocryl and injected with 25 ml of the Exparel solution. Will plan to give Tranexemic acid as patient is transferred to post-op area.   Instrument, sponge, and needle counts were correct prior the abdominal closure and at the conclusion of the case.   Findings: Female infant, cephalic presentation, 4500 grams, with Apgar scores of 8 at one minute and 9 at five minutes. Intact placenta (anterior) with 3 vessel cord.  Clear amniotic fluid at amniotomy The uterine outline,  tubes and ovaries appeared normal.    Estimated Blood Loss:  1235 ml      Drains: foley catheter to gravity drainage, 30 ml of clear urine at end of the procedure         Total IV Fluids:   800 ml  Specimens: Placenta and Disposition:  Sent to Pathology         Implants: None         Complications:  None; patient tolerated the procedure well.         Disposition: PACU - hemodynamically stable.         Condition: stable   Hildred Laser, MD [facility]

## 2020-12-08 NOTE — Anesthesia Preprocedure Evaluation (Signed)
Anesthesia Evaluation  Patient identified by MRN, date of birth, ID band Patient awake    Reviewed: Allergy & Precautions, H&P , NPO status , Patient's Chart, lab work & pertinent test results  History of Anesthesia Complications Negative for: history of anesthetic complications  Airway Mallampati: III  TM Distance: >3 FB Neck ROM: full    Dental  (+) Chipped   Pulmonary neg shortness of breath, asthma ,    Pulmonary exam normal        Cardiovascular Exercise Tolerance: Good (-) angina(-) Past MI and (-) DOE negative cardio ROS Normal cardiovascular exam     Neuro/Psych    GI/Hepatic negative GI ROS,   Endo/Other  diabetes, Type 2  Renal/GU   negative genitourinary   Musculoskeletal   Abdominal   Peds  Hematology negative hematology ROS (+)   Anesthesia Other Findings Past Medical History: 10/04/2016: Acute cholecystitis No date: Asthma No date: Biliary colic No date: Dyshidrotic eczema No date: Gestational diabetes No date: Psoriasis No date: RUQ pain  Past Surgical History: No date: NO PAST SURGERIES  BMI    Body Mass Index: 31.09 kg/m      Reproductive/Obstetrics (+) Pregnancy                             Anesthesia Physical Anesthesia Plan  ASA: III  Anesthesia Plan: Spinal   Post-op Pain Management:    Induction:   PONV Risk Score and Plan:   Airway Management Planned: Natural Airway and Nasal Cannula  Additional Equipment:   Intra-op Plan:   Post-operative Plan:   Informed Consent: I have reviewed the patients History and Physical, chart, labs and discussed the procedure including the risks, benefits and alternatives for the proposed anesthesia with the patient or authorized representative who has indicated his/her understanding and acceptance.     Dental Advisory Given  Plan Discussed with: Anesthesiologist, CRNA and Surgeon  Anesthesia Plan  Comments: (Patient reports no bleeding problems and no anticoagulant use.  Plan for spinal with backup GA  Patient consented for risks of anesthesia including but not limited to:  - adverse reactions to medications - damage to eyes, teeth, lips or other oral mucosa - nerve damage due to positioning  - risk of bleeding, infection and or nerve damage from spinal that could lead to paralysis - risk of headache or failed spinal - damage to teeth, lips or other oral mucosa - sore throat or hoarseness - damage to heart, brain, nerves, lungs, other parts of body or loss of life  Patient voiced understanding.)        Anesthesia Quick Evaluation

## 2020-12-08 NOTE — H&P (Signed)
Obstetric Preoperative History and Physical  Cheryl Wong is a 25 y.o. G2P1001 with IUP at [redacted]w[redacted]d presenting for presenting for scheduled primary cesarean section for fetal macrosomia (>4500 grams), uncontrolled GDM (on Glyburide) due to non-compliance.  Declined vaginal attempt. She has no complaints today.   Prenatal Course Source of Care: [facility]  with onset of care at 15 weeks Pregnancy complications or risks: Patient Active Problem List   Diagnosis Date Noted  . Gestational diabetes 12/08/2020  . Group B Streptococcus urinary tract infection affecting pregnancy in third trimester 11/27/2020  . Gestational diabetes mellitus (GDM) in third trimester 11/27/2020  . Vaginitis in pregnancy 07/06/2020  . Type O blood, Rh positive 07/06/2020  . Late prenatal care 07/03/2020  . Postpartum hemorrhage 11/21/2016  . Acute blood loss anemia 11/21/2016  . Acute cholecystitis 10/04/2016  . RUQ pain   . Biliary colic    She plans to breastfeed She desires unecided method for postpartum contraception.   Prenatal labs and studies: ABO, Rh: --/--/O POS (03/25 0554) Antibody: NEG (03/25 0554) Rubella: 4.99 (10/18 1034) RPR: Non Reactive (01/25 0937)  HBsAg: Negative (01/25 1014)  HIV:    GBS: Positive, in urine culture (NOB) 1 hr Glucola  Abnormal (166). 3 hr GTT abnormal.  Genetic screening normal Anatomy US normal    Past Medical History:  Diagnosis Date  . Acute cholecystitis 10/04/2016  . Asthma   . Biliary colic   . Dyshidrotic eczema   . Gestational diabetes   . Psoriasis   . RUQ pain     Past Surgical History:  Procedure Laterality Date  . NO PAST SURGERIES      OB History  Gravida Para Term Preterm AB Living  2 1 1     1   SAB IAB Ectopic Multiple Live Births        0 1    # Outcome Date GA Lbr Len/2nd Weight Sex Delivery Anes PTL Lv  2 Current           1 Term 11/20/16 [redacted]w[redacted]d / 02:47 3460 g M Vag-Forceps EPI, Local  LIV    Social History    Socioeconomic History  . Marital status: Married    Spouse name: Not on file  . Number of children: Not on file  . Years of education: Not on file  . Highest education level: Not on file  Occupational History  . Not on file  Tobacco Use  . Smoking status: Never Smoker  . Smokeless tobacco: Never Used  Vaping Use  . Vaping Use: Never used  Substance and Sexual Activity  . Alcohol use: No  . Drug use: No  . Sexual activity: Yes    Birth control/protection: Pill  Other Topics Concern  . Not on file  Social History Narrative  . Not on file   Social Determinants of Health   Financial Resource Strain: Not on file  Food Insecurity: Not on file  Transportation Needs: Not on file  Physical Activity: Not on file  Stress: Not on file  Social Connections: Not on file    Family History  Problem Relation Age of Onset  . Diabetes Mother   . Hypertension Father   . Diabetes Father   . Diabetes Brother     Medications Prior to Admission  Medication Sig Dispense Refill Last Dose  . glyBURIDE (DIABETA) 5 MG tablet Take 1 tablet (5 mg total) by mouth daily with breakfast. 30 tablet 0 12/07/2020 at Unknown time  .  Prenatal Vit-Fe Fumarate-FA (PRENATAL MULTIVITAMIN) TABS tablet Take 1 tablet by mouth daily at 12 noon.   12/07/2020 at Unknown time  . Accu-Chek Softclix Lancets lancets Check blood sugars 4 times a day before breakfast and 2 hours after every meal 200 each 3   . acetaminophen (TYLENOL) 500 MG tablet Take 500 mg by mouth every 6 (six) hours as needed for mild pain or moderate pain.     . clotrimazole-betamethasone (LOTRISONE) cream Apply 1 application topically 2 (two) times daily. 30 g 0   . glucose blood test strip Check blood sugars 4 times a day-before breakfast and 2 hours after every meal 200 each 3   . nitrofurantoin, macrocrystal-monohydrate, (MACROBID) 100 MG capsule Take 1 capsule (100 mg total) by mouth at bedtime. 30 capsule 1   . nystatin-triamcinolone ointment  (MYCOLOG) Apply 1 application topically 2 (two) times daily. 60 g 0   . PROAIR HFA 108 (90 Base) MCG/ACT inhaler Inhale 2 puffs into the lungs every 6 (six) hours as needed for wheezing or shortness of breath.     Marland Kitchen RANITIDINE HCL PO Take 360 mg by mouth 2 (two) times daily.       No Known Allergies  Review of Systems: Negative except for what is mentioned in HPI.  Physical Exam: BP 122/75 (BP Location: Left Arm)   Pulse 91   Temp 98.4 F (36.9 C) (Oral)   Resp 16   Ht  (1.575 m)   Wt 77.1 kg   LMP 03/23/2020   BMI 31.09 kg/m  FHR by Doppler: 135 bpm GENERAL: Well-developed, well-nourished female in no acute distress.  LUNGS: Clear to auscultation bilaterally.  HEART: Regular rate and rhythm. ABDOMEN: Soft, nontender, nondistended, gravid. PELVIC: Deferred EXTREMITIES: Nontender, no edema, 2+ distal pulses.   Pertinent Labs/Studies:   Results for orders placed or performed during the hospital encounter of 12/08/20 (from the past 72 hour(s))  Type and screen     Status: None   Collection Time: 12/08/20  5:54 AM  Result Value Ref Range   ABO/RH(D) O POS    Antibody Screen NEG    Sample Expiration      12/11/2020,2359 Performed at [facility], [address] [place], [place] [address]   CBC     Status: Abnormal   Collection Time: 12/08/20  5:55 AM  Result Value Ref Range   WBC 10.0 4.0 - 10.5 K/uL   RBC 4.05 3.87 - 5.11 MIL/uL   Hemoglobin 11.0 (L) 12.0 - 15.0 g/dL   HCT 60.4 (L) 54.0 - 98.1 %   MCV 83.2 80.0 - 100.0 fL   MCH 27.2 26.0 - 34.0 pg   MCHC 32.6 30.0 - 36.0 g/dL   RDW 19.1 47.8 - 29.5 %   Platelets 217 150 - 400 K/uL   nRBC 0.0 0.0 - 0.2 %    Comment: Performed at [facility], [address] [place], [place] [address]  Glucose, capillary     Status: None   Collection Time: 12/08/20  6:37 AM  Result Value Ref Range   Glucose-Capillary 75 70 - 99 mg/dL    Comment: Glucose reference range applies only to samples taken after  fasting for at least 8 hours.     Imaging:  [place] MFM FETAL BPP WO NON STRESS ----------------------------------------------------------------------  OBSTETRICS REPORT                    (Corrected Final 12/05/2020 04:44 pm) ---------------------------------------------------------------------- Patient Info  ID #:       [id number]                          D.O.B.:  [birth year] (24 yrs)  Name:       Cheryl Wong [facility]                  Visit Date: 12/05/2020 04:20 pm ---------------------------------------------------------------------- Performed By  Attending:        Lin Landsman      Ref. Address:     [facility]                    MD                                                             [facility]                                                             [address]                                                             [address]                                                             [place]                                                             [address]  Performed By:     Neita Carp RDMS        [facility]                                                             [facility]  Referred By:      Gean Maidens                    LAWHORN ---------------------------------------------------------------------- Orders  #  Description                           Code        Ordered By  1  [place] MFM OB DETAIL +14 WK               76811.01    JENKINS LAWHORN  2  [place] MFM FETAL BPP WO NON               76819.01    JENKINS LAWHORN     STRESS ----------------------------------------------------------------------  #  Order #                     Accession #                Episode #  1  [id number]                   [id number]                 [id number]  2  [id number]                   [id number]                  [id number] ---------------------------------------------------------------------- Indications  [redacted] weeks gestation of pregnancy                Z3A.37  Gestational diabetes in pregnancy,             O24.415  controlled by oral hypoglycemic drugs ---------------------------------------------------------------------- Fetal Evaluation  Num Of Fetuses:         1  Fetal Heart Rate(bpm):  140  Cardiac Activity:       Observed  Presentation:           Cephalic  Placenta:               Anterior  P. Cord Insertion:      Visualized, central  AFI Sum(cm)     %Tile       Largest Pocket(cm)  16.             61          5.9  RUQ(cm)       RLQ(cm)       LUQ(cm)        LLQ(cm)  5.9           1.8           3.1            5.2 ---------------------------------------------------------------------- Biophysical Evaluation  Amniotic F.V:   Within normal limits       F. Tone:        Observed  F. Movement:    Observed                   Score:          8/8  F. Breathing:   Observed ---------------------------------------------------------------------- Biometry  BPD:     101.4  mm     G. Age:  41w 6d       > 99  %    CI:        77.69   %  70 - 86                                                          FL/HC:      21.5   %    20.8 - 22.6  HC:      364.1  mm     G. Age:  43w 1d       > 99  %    HC/AC:      0.93        0.92 - 1.05  AC:      391.9  mm     G. Age:  43w 1d       > 99  %    FL/BPD:     77.2   %    71 - 87  FL:       78.3  mm     G. Age:  40w 0d         97  %    FL/AC:      20.0   %    20 - 24  HUM:      65.9  mm     G. Age:  38w 2d         91  %  LV:        7.9  mm  Est. FW:    4728  gm    10 lb 7 oz    > 99  % ---------------------------------------------------------------------- Gestational Age  LMP:           36w 5d        Date:  03/23/20                 EDD:   12/28/20  U/S Today:     42w 0d                                        EDD:   11/21/20  Best:          37w 2d     Det. By:  Marcella Dubs         EDD:   12/24/20                                      (05/10/20) ---------------------------------------------------------------------- Anatomy  Cranium:               Not well visualized    LVOT:                   Not well visualized  Cavum:                 Appears normal         Aortic Arch:            Not well visualized  Ventricles:            Appears normal         Ductal Arch:            Not well visualized  Choroid Plexus:        Not well  visualized    Diaphragm:              Appears normal  Cerebellum:            Not well visualized    Stomach:                Appears normal, left                                                                        sided  Posterior Fossa:       Not well visualized    Abdomen:                Not well visualized  Nuchal Fold:           Not applicable (>20    Abdominal Wall:         Not well visualized                         wks GA)  Face:                  Orbits appear          Cord Vessels:           Appears normal (3                         normal                                         vessel cord)  Lips:                  Appears normal         Kidneys:                Appear normal  Palate:                Not well visualized    Bladder:                Appears normal  Thoracic:              Appears normal         Spine:                  Limited views                                                                        appear normal  Heart:                 Not well visualized    Upper Extremities:      Not well visualized  RVOT:                  Not well visualized    Lower  Extremities:      Not well visualized ---------------------------------------------------------------------- Impression  Single intrauterine pregnancy here for a detailed anatomy  due to A2GDM  Normal anatomy with measurements large for dates  There is good fetal movement and amniotic fluid volume  Suboptimal views of the fetal anatomy were obtained   secondary to fetal position and advanced gestational age.  Biophysical profile 8/8  Cheryl Wong reports that her blood sugars have been in the  100's mg/dL and her postprandial are in the 150's. I  discussed that given that the fetus is > 4500 g with A2GDM  would recommend counseling regardin cesarean delivery and  risk and benefits of vaginal delivery.  Consider delivery between 37-38 weeks given EFW.  I discussed the plan of care with Dr. Valentino Saxon who was on call. ---------------------------------------------------------------------- Recommendations  Daily kick counts.  Consider 2x weekly testing via NST/AFI and BPP.  Consider delivery between 37-39 week. ----------------------------------------------------------------------                    Lin Landsman, MD Electronically Signed Corrected Final Report  12/05/2020 04:44 pm ---------------------------------------------------------------------- [place] MFM OB DETAIL +14 WK ----------------------------------------------------------------------  OBSTETRICS REPORT                    (Corrected Final 12/05/2020 04:44 pm) ---------------------------------------------------------------------- Patient Info  ID #:       [id number]                          D.O.B.:  05-Mar-1996 (24 yrs)  Name:       Cheryl Wong [facility]                  Visit Date: 12/05/2020 04:20 pm ---------------------------------------------------------------------- Performed By  Attending:        Lin Landsman      Ref. Address:     [facility]                    MD                                                             [facility]                                                             [address]                                                             [address]  [place]                                                              [address]  Performed By:     Neita Carp RDMS        [facility] at                                                             [facility]  Referred By:      Gean Maidens                    LAWHORN ---------------------------------------------------------------------- Orders  #  Description                           Code        Ordered By  1  [place] MFM OB DETAIL +14 WK               76811.01    JENKINS LAWHORN  2  [place] MFM FETAL BPP WO NON               76819.01    [facility] LAWHORN     STRESS ----------------------------------------------------------------------  #  Order #                     Accession #                Episode #  1  [id number]                   [id number]                 [id number]  2  [id number]                   [id number]                 [id number] ---------------------------------------------------------------------- Indications  [redacted] weeks gestation of pregnancy                Z3A.37  Gestational diabetes in pregnancy,             O24.415  controlled by oral hypoglycemic drugs ---------------------------------------------------------------------- Fetal Evaluation  Num Of Fetuses:         1  Fetal Heart Rate(bpm):  140  Cardiac Activity:       Observed  Presentation:           Cephalic  Placenta:               Anterior  P. Cord Insertion:      Visualized, central  AFI Sum(cm)     %Tile       Largest Pocket(cm)  16.             61          5.9  RUQ(cm)       RLQ(cm)       LUQ(cm)        LLQ(cm)  5.9           1.8           3.1            5.2 ---------------------------------------------------------------------- Biophysical Evaluation  Amniotic F.V:   Within normal limits       F. Tone:        Observed  F. Movement:    Observed                   Score:          8/8  F. Breathing:   Observed ---------------------------------------------------------------------- Biometry   BPD:     101.4  mm     G. Age:  41w 6d       > 99  %    CI:        77.69   %    70 - 86                                                          FL/HC:      21.5   %    20.8 - 22.6  HC:      364.1  mm     G. Age:  43w 1d       > 99  %    HC/AC:      0.93        0.92 - 1.05  AC:      391.9  mm     G. Age:  43w 1d       > 99  %    FL/BPD:     77.2   %    71 - 87  FL:       78.3  mm     G. Age:  40w 0d         97  %    FL/AC:      20.0   %    20 - 24  HUM:      65.9  mm     G. Age:  38w 2d         91  %  LV:        7.9  mm  Est. FW:    4728  gm    10 lb 7 oz    > 99  % ---------------------------------------------------------------------- Gestational Age  LMP:           36w 5d        Date:  03/23/20                 EDD:   12/28/20  U/S Today:     42w 0d  EDD:   11/21/20  Best:          37w 2d     Det. ByMarcella Dubs         EDD:   12/24/20                                      (05/10/20) ---------------------------------------------------------------------- Anatomy  Cranium:               Not well visualized    LVOT:                   Not well visualized  Cavum:                 Appears normal         Aortic Arch:            Not well visualized  Ventricles:            Appears normal         Ductal Arch:            Not well visualized  Choroid Plexus:        Not well visualized    Diaphragm:              Appears normal  Cerebellum:            Not well visualized    Stomach:                Appears normal, left                                                                        sided  Posterior Fossa:       Not well visualized    Abdomen:                Not well visualized  Nuchal Fold:           Not applicable (>20    Abdominal Wall:         Not well visualized                         wks GA)  Face:                  Orbits appear          Cord Vessels:           Appears normal (3                         normal                                          vessel cord)  Lips:                  Appears normal         Kidneys:                Appear normal  Palate:                Not well visualized    Bladder:                Appears normal  Thoracic:              Appears normal         Spine:                  Limited views                                                                        appear normal  Heart:                 Not well visualized    Upper Extremities:      Not well visualized  RVOT:                  Not well visualized    Lower Extremities:      Not well visualized ---------------------------------------------------------------------- Impression  Single intrauterine pregnancy here for a detailed anatomy  due to A2GDM  Normal anatomy with measurements large for dates  There is good fetal movement and amniotic fluid volume  Suboptimal views of the fetal anatomy were obtained  secondary to fetal position and advanced gestational age.  Biophysical profile 8/8  Cheryl Wong reports that her blood sugars have been in the  100's mg/dL and her postprandial are in the 150's. I  discussed that given that the fetus is > 4500 g with A2GDM  would recommend counseling regardin cesarean delivery and  risk and benefits of vaginal delivery.  Consider delivery between 37-38 weeks given EFW.  I discussed the plan of care with Dr. Valentino Saxon who was on call. ---------------------------------------------------------------------- Recommendations  Daily kick counts.  Consider 2x weekly testing via NST/AFI and BPP.  Consider delivery between 37-39 week. ----------------------------------------------------------------------                    Lin Landsman, MD Electronically Signed Corrected Final Report  12/05/2020 04:44 pm ----------------------------------------------------------------------   Assessment and Plan :Cheryl Wong is a 25 y.o. G2P1001 at [redacted]w[redacted]d being admitted  for scheduled primary cesarean section delivery for uncontrolled GDM  and fetal macrosomia. The patient is understanding of the planned procedure and is aware of and accepting of all surgical risks, including but not limited to: bleeding which may require transfusion or reoperation; infection which may require antibiotics; injury to bowel, bladder, ureters or other surrounding organs which may require repair; injury to the fetus; need for additional procedures including hysterectomy in the event of life-threatening complications; placental abnormalities wth subsequent pregnancies; incisional problems; blood clot disorders which may require blood thinners;, and other postoperative/anesthesia complications. The patient is in agreement with the proposed plan, and gives informed written consent for the procedure. All questions have been answered.    Hildred Laser, MD [facility]

## 2020-12-09 LAB — HEMOGLOBIN AND HEMATOCRIT, BLOOD
HCT: 25 % — ABNORMAL LOW (ref 36.0–46.0)
Hemoglobin: 8.4 g/dL — ABNORMAL LOW (ref 12.0–15.0)

## 2020-12-09 LAB — CBC
HCT: 18 % — ABNORMAL LOW (ref 36.0–46.0)
Hemoglobin: 5.8 g/dL — ABNORMAL LOW (ref 12.0–15.0)
MCH: 27.6 pg (ref 26.0–34.0)
MCHC: 32.2 g/dL (ref 30.0–36.0)
MCV: 85.7 fL (ref 80.0–100.0)
Platelets: 193 10*3/uL (ref 150–400)
RBC: 2.1 MIL/uL — ABNORMAL LOW (ref 3.87–5.11)
RDW: 13.4 % (ref 11.5–15.5)
WBC: 11.8 10*3/uL — ABNORMAL HIGH (ref 4.0–10.5)
nRBC: 0 % (ref 0.0–0.2)

## 2020-12-09 LAB — GLUCOSE, CAPILLARY
Glucose-Capillary: 144 mg/dL — ABNORMAL HIGH (ref 70–99)
Glucose-Capillary: 149 mg/dL — ABNORMAL HIGH (ref 70–99)
Glucose-Capillary: 139 mg/dL — ABNORMAL HIGH (ref 70–99)
Glucose-Capillary: 80 mg/dL (ref 70–99)

## 2020-12-09 MED ORDER — DIPHENHYDRAMINE HCL 25 MG PO CAPS
25.0000 mg | ORAL_CAPSULE | Freq: Once | ORAL | Status: AC
  Administered 2020-12-09: 25 mg via ORAL
  Filled 2020-12-09: qty 1

## 2020-12-09 MED ORDER — ACETAMINOPHEN 500 MG PO TABS
1000.0000 mg | ORAL_TABLET | Freq: Once | ORAL | Status: AC
  Administered 2020-12-09: 1000 mg via ORAL
  Filled 2020-12-09: qty 2

## 2020-12-09 MED ORDER — SODIUM CHLORIDE 0.9% IV SOLUTION: Freq: Once | INTRAVENOUS | Status: DC

## 2020-12-09 MED ORDER — GLYBURIDE 2.5 MG PO TABS
2.5000 mg | ORAL_TABLET | Freq: Two times a day (BID) | ORAL | Status: DC
  Administered 2020-12-09 – 2020-12-10 (×3): 2.5 mg via ORAL
  Filled 2020-12-09 (×4): qty 1

## 2020-12-09 NOTE — Progress Notes (Signed)
Reported to Dr. Valentino Saxon H&H results Post Blood Transfusion and Pt. C/O Gas Pain causing right Shoulder Pain. No new orders received.

## 2020-12-09 NOTE — Lactation Note (Signed)
This note was copied from a baby's chart. Lactation Consultation Note  Patient Name: Cheryl Wong Today's Date: 12/09/2020 Reason for consult: Follow-up assessment;Mother's request;Early term 28-38.6wks Age:27 hours Lactation to the room for initial visit. Mother is resting. LC # placed on board, encouraged to call prior to another attempt at breast. Sister-in-law at bedside, stated understanding.   LC back to the room. Mother had baby latched onto the left in reclined position. Encouraged feeding on demand and with cues. If baby is not cueing encouraged hand expression and skin to skin. Taught proper technique for hand expression.  Encouraged 8 or more good feeds after 24 HOL. Reviewed appropriate diapers for days of life and How to know your baby is getting enough to eat. Reviewed "Understanding Postpartum and Newborn Care" INJOY booklet at bedside. LC assisted with BF. Encouraged stimulating baby if he is falling asleep quickly at breast. Baby has a good rhythmic sucking pattern with swallows when stimulated. Discussed his gestational age and how he may become more sleep at breast.   [facility] # left on board, encouraged to call for any assistance. Mother has no further questions at this time.   Maternal Data Has patient been taught Hand Expression?: Yes Does the patient have breastfeeding experience prior to this delivery?: Yes How long did the patient breastfeed?: 2 days  Feeding Mother's Current Feeding Choice: Breast Milk and Formula  LATCH Score Latch: Grasps breast easily, tongue down, lips flanged, rhythmical sucking.  Audible Swallowing: A few with stimulation  Type of Nipple: Everted at rest and after stimulation  Comfort (Breast/Nipple): Filling, red/small blisters or bruises, mild/mod discomfort  Hold (Positioning): Assistance needed to correctly position infant at breast and maintain latch.  LATCH Score: 7   Lactation Tools Discussed/Used     Interventions Interventions: Breast feeding basics reviewed;Assisted with latch;Breast massage;Hand express;Breast compression;Adjust position;Support pillows;Position options;Education (discussed pumping due to supplementation)  Discharge Discharge Education: Engorgement and breast care;Warning signs for feeding baby Pump: Personal WIC Program: No  Consult Status Consult Status: Follow-up Date: 12/10/20 Follow-up type: Call as needed    Cheryl Wong D Cheryl Wong 12/09/2020, 5:10 PM

## 2020-12-09 NOTE — Progress Notes (Addendum)
Postpartum Day # 1: Cesarean Delivery  Subjective: Patient reports incisional pain and tolerating PO.  Pain is managed with PO. Has not voided yet, still has catheter in place. Feels tired. Has not passed flatus.   Objective: Vital signs in last 24 hours: Temp:  [97.6 F (36.4 C)-98.6 F (37 C)] 97.7 F (36.5 C) (03/26 0755) Pulse Rate:  [80-122] 101 (03/26 0755) Resp:  [12-28] 18 (03/26 0755) BP: (95-134)/(51-120) 113/60 (03/26 0755) SpO2:  [95 %-100 %] 100 % (03/26 0755)  I/O last 3 completed shifts: In: 886.5 [I.V.:786.5; IV Piggyback:100] Out: 3635 [Urine:780; Blood:2855] No intake/output data recorded.   Physical Exam:  General: alert and no distress Lungs: clear to auscultation bilaterally Breasts: normal appearance, no masses or tenderness Heart: regular rate and rhythm, S1, S2 normal, no murmur, click, rub or gallop Abdomen: soft, non-tender; bowel sounds normal; no masses,  no organomegaly Pelvis: Lochia appropriate, Uterine Fundus firm, Incision: healing well, no significant drainage, no dehiscence, no significant erythema Extremities: DVT Evaluation: No evidence of DVT seen on physical exam. Negative Homan's sign. No cords or calf tenderness. No significant calf/ankle edema.  Recent Labs    12/08/20 0555 12/09/20 0536  HGB 11.0* 5.8*  HCT 33.7* 18.0*     Results for LONITA, DEBES (MRN [id number]) as of 12/09/2020 12:53  Ref. Range 12/09/2020 05:54  Glucose-Capillary Latest Ref Range: 70 - 99 mg/dL 810 (H)    Assessment/Plan: Status post Cesarean section. Postoperative course complicated by postpartum hemorrhage  Breastfeeding, Lactation consult Contraception: OCPs Regular diet. Unclear if recent blood sugar was true fasting. Will continue to monitor sugars. If elevated, will change to carb modified diet and resume Glyburide.  Continue PO pain management Remove foley catheter as urine output has returned to normal.  Discontinue IVF once tolerating regular  diet.  Blood transfusion ordered for anemia. Will transfuse 2 units and reassess. 1 additional unit on hold.  Encourage ambulation with assistance after blood transfusion.  Continue current care.  Hildred Laser, MD [facility]

## 2020-12-09 NOTE — Anesthesia Postprocedure Evaluation (Signed)
Anesthesia Post Note  Patient: Cheryl Wong  Procedure(s) Performed: CESAREAN SECTION (N/A )  Patient location during evaluation: Mother Baby Anesthesia Type: Spinal Level of consciousness: oriented and awake and alert Pain management: pain level controlled Vital Signs Assessment: post-procedure vital signs reviewed and stable Respiratory status: spontaneous breathing and respiratory function stable Cardiovascular status: blood pressure returned to baseline and stable Postop Assessment: no headache, no backache, no apparent nausea or vomiting and able to ambulate Anesthetic complications: no   No complications documented.   Last Vitals:  Vitals:   12/09/20 1326 12/09/20 1403  BP: 120/70 116/61  Pulse: 94 99  Resp: 18 18  Temp: 36.5 C 36.9 C  SpO2: 96% 99%    Last Pain:  Vitals:   12/09/20 1500  TempSrc:   PainSc: 7                  Lenard Simmer

## 2020-12-09 NOTE — Progress Notes (Signed)
C/O Pain that is sharp and comes and goes and is located in the left mid abdomen. Gabapentin and Toradol given as per scheduled order. Instructed Pt. That this could be gas pain and that after she is finished eating, we would walk in [place]. Pt. V/o.

## 2020-12-09 NOTE — Progress Notes (Signed)
CBG is 80;No s/s Hypoglycemia. Pt. Has not eaten dinner. Dinner warmed in [employer] and Pt. Is eating. Tolerating well.

## 2020-12-09 NOTE — Anesthesia Post-op Follow-up Note (Signed)
  Anesthesia Pain Follow-up Note  Patient: Cheryl Wong  Day #: 1  Date of Follow-up: 12/09/2020 Time: 3:51 PM  Last Vitals:  Vitals:   12/09/20 1326 12/09/20 1403  BP: 120/70 116/61  Pulse: 94 99  Resp: 18 18  Temp: 36.5 C 36.9 C  SpO2: 96% 99%    Level of Consciousness: alert  Pain: mild   Side Effects:None  Catheter Site Exam:clean, dry     Plan: D/C from anesthesia care at surgeon's request  Lenard Simmer

## 2020-12-10 LAB — GLUCOSE, CAPILLARY
Glucose-Capillary: 74 mg/dL (ref 70–99)
Glucose-Capillary: 63 mg/dL — ABNORMAL LOW (ref 70–99)
Glucose-Capillary: 97 mg/dL (ref 70–99)
Glucose-Capillary: 77 mg/dL (ref 70–99)

## 2020-12-10 MED ORDER — GLYBURIDE 5 MG PO TABS: 2.5000 mg | ORAL_TABLET | Freq: Two times a day (BID) | ORAL | 0 refills | Status: DC

## 2020-12-10 MED ORDER — IBUPROFEN 800 MG PO TABS: 800.0000 mg | ORAL_TABLET | Freq: Three times a day (TID) | ORAL | 1 refills | Status: DC | PRN

## 2020-12-10 MED ORDER — DOCUSATE SODIUM 100 MG PO CAPS: 100.0000 mg | ORAL_CAPSULE | Freq: Two times a day (BID) | ORAL | 2 refills | Status: DC | PRN

## 2020-12-10 MED ORDER — FERROUS SULFATE 325 (65 FE) MG PO TABS: 325.0000 mg | ORAL_TABLET | Freq: Two times a day (BID) | ORAL | 1 refills | Status: DC

## 2020-12-10 MED ORDER — OXYCODONE HCL 5 MG PO TABS: 5.0000 mg | ORAL_TABLET | ORAL | 0 refills | Status: DC | PRN

## 2020-12-10 MED ORDER — FLEET ENEMA 7-19 GM/118ML RE ENEM
1.0000 | ENEMA | Freq: Once | RECTAL | Status: AC
  Administered 2020-12-10: 1 via RECTAL

## 2020-12-10 MED ORDER — ACETAMINOPHEN 500 MG PO TABS: 1000.0000 mg | ORAL_TABLET | Freq: Four times a day (QID) | ORAL | 1 refills | Status: DC | PRN

## 2020-12-10 NOTE — Lactation Note (Signed)
This note was copied from a baby's chart. Lactation Consultation Note  Patient Name: Cheryl Wong Today's Date: 12/10/2020 Reason for consult: Follow-up assessment;Early term 27-38.6wks Age:27 hours  LC student seen mother for follow up consult. Upon entry mother informed that infant just completed feeding with of formula.  Mother stated that she has not put infant to breast since feeding last night. She is not currently pumping. States that she is having contractions with feeding so she had not been taking to breast.  Infant was suckling on a pacifier. [facility] student provided risk of pacifier usage. Pacifier was removed and infant began to show feedings cues. Oral assessment was completed, no oral restrictions noted.  Mother put infant to L breast in cross-cradle position, infant showed rhythmic sucking and audible swallows without stimulation. Infant feed for 10 minutes then mother switched to R breast in cradle position, infant was left at breast.   [employer] [facility] student provided discharged education. Reviewed INJOY booklet, avoidance of pacifier usage, nipple and breast care, I/Os, importance of STS, infant behavior/feeding cues, and outpatient lactation support.   Plan: - Continue to feed infant at least 8-12x in 24 hour period, following feeding cues  - Continue STS as much as possible - Avoid pacifier usage until breastfeeding and milk supply is established  Feeding Mother's Current Feeding Choice: Breast Milk and Formula  LATCH Score Latch: Grasps breast easily, tongue down, lips flanged, rhythmical sucking.  Audible Swallowing: Spontaneous and intermittent  Type of Nipple: Everted at rest and after stimulation  Comfort (Breast/Nipple): Soft / non-tender  Hold (Positioning): Assistance needed to correctly position infant at breast and maintain latch.  LATCH Score: 9   Interventions Interventions: Breast feeding basics reviewed;Skin to skin;Breast compression;Support  pillows;Education  Discharge Discharge Education: Engorgement and breast care  Consult Status Consult Status: Complete    Zola Button 12/10/2020, 11:01 AM

## 2020-12-10 NOTE — Progress Notes (Signed)
Discharge order received from doctor. Reviewed discharge instructions and prescriptions with patient and answered all questions. Follow up appointment instructions given. Patient verbalized understanding. ID bands checked. Patient discharged home with infant via wheelchair by nursing/auxillary.    Saksham Akkerman, RN  

## 2020-12-10 NOTE — Lactation Note (Signed)
This note was copied from a baby's chart. Lactation Consultation Note  Patient Name: Cheryl Wong Today's Date: 12/10/2020 Reason for consult: Follow-up assessment Age:25 years   LC Student returned to set up pump and provide pumping education. Pump was set up, using 27 flange. She immediately began pumping volume, d/c initiation phase. Pumped a total of 30 ml combined in 15 minutes. Educated mother and support person on pump setup, frequency, cleaning parts, and EBM storage guidelines. Labels were provided.   Mother does not have WIC but is interested receiving a pump. Sending referral.   Plan: - Continue to feed on demand, at least 8-12x in 24 hour period, following feeding cues - Offer breast first, then EBM if supplementation is necessary prior to offering formula.  - Pump for 15 minutes if he does not latch or if offered a bottle   Interventions Interventions: Breast feeding basics reviewed;Breast massage;Education;DEBP;Hand pump;Expressed milk  Discharge WIC Program: No  Consult Status Consult Status: Complete    Zola Button 12/10/2020, 2:51 PM

## 2020-12-10 NOTE — Discharge Instructions (Signed)
Please call your doctor or return to the ER if you experience any chest pains, shortness of breath, dizziness, visual changes, severe headache (unrelieved by pain meds), fever greater than 101, any heavy bleeding (saturating more than 1 pad per hour), large clots, or foul smelling discharge, any worsening abdominal pain and cramping that is not controlled by pain medication, any calf/leg pain or redness, any breast concerns (redness/pain), or any signs of postpartum depression. No tampons, enemas, douches, or sexual intercourse for 6 weeks. Also avoid tub baths, hot tubs, or swimming for 6 weeks.    Check your incision daily for any signs of infection such as redness, warmth, swelling, increased pain, or pus/foul smelling drainage   Activity: do not lift over 10-15 pounds for 6 weeks  No driving for 1-2 weeks  Pelvic rest for 6 weeks Showers only 6 weeks (no tub baths)

## 2020-12-10 NOTE — Discharge Summary (Signed)
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     Discharge diagnosis: Term Pregnancy Delivered, GDM A2, Anemia and PPH                                              Post partum procedures:blood transfusion Augmentation: N/A Complications: [id number]  Hospital course: Sceduled C/S   25 y.o. yo G2P2002 at [redacted]w[redacted]d was admitted to the hospital 12/08/2020 for scheduled cesarean section with the following indication:Macrosomia and Elective Primary.Delivery details are as follows:  Membrane Rupture Time/Date: 8:34 AM ,12/08/2020   Delivery Method:C-Section, Low Transverse  Details of operation can be found in separate operative note. She sustained a postpartum hemorrhage on POD#0.  She received a blood transfusion on PPD#1 of 2 units PRBCs. Patient had an otherwise uncomplicated postpartum course. She was resumed on her Glyburide for elevated blood sugars postpartum. She is ambulating, tolerating a carb modified diet, passing flatus, and urinating well. Patient is discharged home in stable condition on  12/10/20        Newborn Data: Birth date:12/08/2020  Birth time:8:35 AM  Gender:Female  Living status:Living  Apgars:8 ,9  Weight:4500 g     Magnesium Sulfate received: No BMZ received: No Rhophylac:No MMR:No T-DaP:Given prenatally Flu: No Transfusion:Yes  Physical exam  Vitals:   12/09/20 1949 12/09/20  2322 12/10/20 0321 12/10/20 0821  BP: 127/77 115/70 121/84 128/86  Pulse: 98 100 96 99  Resp: $Remo'20 18 20 18  'FSwRq$ Temp: 97.8 F (36.6 C) 98.2 F (36.8 C) 98.2 F (36.8 C) 98 F (36.7 C)  TempSrc:      SpO2: 100% 100% 99% 98%  Weight:      Height:       General: alert, cooperative and no distress Lochia: appropriate Uterine Fundus: firm Incision: Healing well with no significant drainage, No significant erythema, Dressing is clean, dry, and intact DVT Evaluation: No evidence of DVT seen on physical exam. Negative Homan's sign. No cords or calf tenderness. No significant calf/ankle edema.   Labs: CBC Latest Ref Rng & Units 12/09/2020 12/09/2020 12/08/2020  WBC 4.0 - 10.5 K/uL - 11.8(H) 10.0  Hemoglobin 12.0 - 15.0 g/dL 8.4(L) 5.8(L) 11.0(L)  Hematocrit 36.0 - 46.0 % 25.0(L) 18.0(L) 33.7(L)  Platelets 150 - 400 K/uL - 193 217    CMP Latest Ref Rng & Units 08/02/2020  Glucose 70 - 99 mg/dL 75  BUN 6 - 20 mg/dL <5(L)  Creatinine 0.44 - 1.00 mg/dL <0.30(L)  Sodium 135 - 145 mmol/L 139  Potassium 3.5 - 5.1 mmol/L 3.8  Chloride 98 - 111 mmol/L 105  CO2 22 - 32 mmol/L 23  Calcium 8.9 - 10.3 mg/dL 8.5(L)  Total Protein 6.5 - 8.1 g/dL 6.7  Total Bilirubin 0.3 - 1.2 mg/dL 0.5  Alkaline Phos 38 - 126 U/L 53  AST 15 -  41 U/L 16  ALT 0 - 44 U/L 12   Edinburgh Score: Edinburgh Postnatal Depression Scale Screening Tool 12/09/2020  I have been able to laugh and see the funny side of things. 0  I have looked forward with enjoyment to things. 0  I have blamed myself unnecessarily when things went wrong. 0  I have been anxious or worried for no good reason. 1  I have felt scared or panicky for no good reason. 1  Things have been getting on top of me. 1  I have been so unhappy that I have had difficulty sleeping. 0  I have felt sad or miserable. 0  I have been so unhappy that I have been crying. 0  The thought of harming myself has occurred to me. 0  Edinburgh Postnatal Depression Scale Total  3      After visit meds:  Allergies as of 12/10/2020   No Known Allergies     Medication List    STOP taking these medications   clotrimazole-betamethasone cream Commonly known as: Lotrisone   nitrofurantoin (macrocrystal-monohydrate) 100 MG capsule Commonly known as: Macrobid   nystatin-triamcinolone ointment Commonly known as: MYCOLOG   RANITIDINE HCL PO     TAKE these medications   Accu-Chek Softclix Lancets lancets Check blood sugars 4 times a day before breakfast and 2 hours after every meal   acetaminophen 500 MG tablet Commonly known as: TYLENOL Take 2 tablets (1,000 mg total) by mouth every 6 (six) hours as needed. What changed:   how much to take  reasons to take this   docusate sodium 100 MG capsule Commonly known as: COLACE Take 1 capsule (100 mg total) by mouth 2 (two) times daily as needed.   ferrous sulfate 325 (65 FE) MG tablet Take 1 tablet (325 mg total) by mouth 2 (two) times daily with a meal.   glucose blood test strip Check blood sugars 4 times a day-before breakfast and 2 hours after every meal   glyBURIDE 5 MG tablet Commonly known as: DIABETA Take 0.5 tablets (2.5 mg total) by mouth 2 (two) times daily with a meal. What changed:   how much to take  when to take this   ibuprofen 800 MG tablet Commonly known as: ADVIL Take 1 tablet (800 mg total) by mouth every 8 (eight) hours as needed for mild pain.   oxyCODONE 5 MG immediate release tablet Commonly known as: Oxy IR/ROXICODONE Take 1-2 tablets (5-10 mg total) by mouth every 4 (four) hours as needed for moderate pain or severe pain.   prenatal multivitamin Tabs tablet Take 1 tablet by mouth daily at 12 noon.   ProAir HFA 108 (90 Base) MCG/ACT inhaler Generic drug: albuterol Inhale 2 puffs into the lungs every 6 (six) hours as needed for wheezing or shortness of breath.        Discharge home in stable condition Infant Feeding: Breast Infant Disposition:home with  mother Discharge instruction: per After Visit Summary and Postpartum booklet. Activity: Advance as tolerated. Pelvic rest for 6 weeks.  Diet: routine diet Anticipated Birth Control: OCPs Postpartum Appointment:6 weeks Additional Postpartum F/U: Incision check 1 week Future Appointments: Future Appointments  Date Time Provider Department Center  12/14/2020  9:15 AM Lawhorn, Vanessa [place], CNM [facility] None   Follow up Visit:  Follow-up Information    Gunnar Bulla, CNM. Call.   Specialties: Certified Nurse Midwife, Obstetrics and Gynecology, Radiology Why: Someone from the office will call to schedule a 1 week incision check  appointment.  6 week postpartum check.  Contact information: [place] [address] [phone]                   12/10/2020 Rubie Maid, MD  [facility]

## 2020-12-11 ENCOUNTER — Encounter: Payer: Medicaid Other | Admitting: Certified Nurse Midwife

## 2020-12-11 ENCOUNTER — Other Ambulatory Visit: Payer: Medicaid Other

## 2020-12-12 LAB — TYPE AND SCREEN
ABO/RH(D): O POS
Antibody Screen: NEGATIVE
Unit division: 0
Unit division: 0
Unit division: 0

## 2020-12-12 LAB — BPAM RBC
Blood Product Expiration Date: 202204232359
Blood Product Expiration Date: 202204232359
Blood Product Expiration Date: 202204262359
ISSUE DATE / TIME: 202203261019
ISSUE DATE / TIME: 202203261338
Unit Type and Rh: 5100
Unit Type and Rh: 5100
Unit Type and Rh: 5100

## 2020-12-12 LAB — PREPARE RBC (CROSSMATCH)

## 2020-12-14 ENCOUNTER — Other Ambulatory Visit: Payer: Self-pay

## 2020-12-14 ENCOUNTER — Ambulatory Visit (INDEPENDENT_AMBULATORY_CARE_PROVIDER_SITE_OTHER): Payer: Medicaid Other | Admitting: Certified Nurse Midwife

## 2020-12-14 ENCOUNTER — Encounter: Payer: Self-pay | Admitting: Certified Nurse Midwife

## 2020-12-14 VITALS — BP 114/76 | HR 81 | Temp 99.2°F | Resp 16 | Ht 62.0 in | Wt 155.0 lb

## 2020-12-14 DIAGNOSIS — Z98891 History of uterine scar from previous surgery: Secondary | ICD-10-CM

## 2020-12-14 DIAGNOSIS — Z5189 Encounter for other specified aftercare: Secondary | ICD-10-CM

## 2020-12-14 DIAGNOSIS — Z8632 Personal history of gestational diabetes: Secondary | ICD-10-CM

## 2020-12-14 MED ORDER — OXYCODONE HCL 5 MG PO TABS: 5.0000 mg | ORAL_TABLET | ORAL | 0 refills | Status: DC | PRN

## 2020-12-14 NOTE — Patient Instructions (Addendum)
Constipation, Adult Constipation is when a person has trouble pooping (having a bowel movement). When you have this condition, you may poop fewer than 3 times a week. Your poop (stool) may also be dry, hard, or bigger than normal. Follow these instructions at home: Eating and drinking  Eat foods that have a lot of fiber, such as: ? Fresh fruits and vegetables. ? Whole grains. ? Beans.  Eat less of foods that are low in fiber and high in fat and sugar, such as: ? [place] fries. ? Hamburgers. ? Cookies. ? Candy. ? Soda.  Drink enough fluid to keep your pee (urine) pale yellow.   General instructions  Exercise regularly or as told by your doctor. Try to do 150 minutes of exercise each week.  Go to the restroom when you feel like you need to poop. Do not hold it in.  Take over-the-counter and prescription medicines only as told by your doctor. These include any fiber supplements.  When you poop: ? Do deep breathing while relaxing your lower belly (abdomen). ? Relax your pelvic floor. The pelvic floor is a group of muscles that support the rectum, bladder, and intestines (as well as the uterus in women).  Watch your condition for any changes. Tell your doctor if you notice any.  Keep all follow-up visits as told by your doctor. This is important. Contact a doctor if:  You have pain that gets worse.  You have a fever.  You have not pooped for 4 days.  You vomit.  You are not hungry.  You lose weight.  You are bleeding from the opening of the butt (anus).  You have thin, pencil-like poop. Get help right away if:  You have a fever, and your symptoms suddenly get worse.  You leak poop or have blood in your poop.  Your belly feels hard or bigger than normal (bloated).  You have very bad belly pain.  You feel dizzy or you faint. Summary  Constipation is when a person poops fewer than 3 times a week, has trouble pooping, or has poop that is dry, hard, or bigger than  normal.  Eat foods that have a lot of fiber.  Drink enough fluid to keep your pee (urine) pale yellow.  Take over-the-counter and prescription medicines only as told by your doctor. These include any fiber supplements. This information is not intended to replace advice given to you by your health care provider. Make sure you discuss any questions you have with your health care provider. Document Revised: 07/21/2019 Document Reviewed: 07/21/2019 Elsevier Patient Education  2021 Elsevier Inc.   Incision Care, Adult An incision is a cut that a doctor makes in your skin for surgery. Most times, these cuts are closed after surgery. Your cut from surgery may be closed with:  Stitches (sutures).  Staples.  Skin glue.  Skin tape (adhesive) strips. You may need to return to your doctor to have stitches or staples taken out. This may happen many days or many weeks after your surgery. You need to take good care of your cut so it does not get infected. Follow instructions from your doctor about how to care for your cut. Supplies needed:  Soap and water.  A clean hand towel.  Wound cleanser.  A clean bandage (dressing), if needed.  Cream or ointment, if told by your doctor.  Clean gauze. How to care for your cut from surgery Cleaning your cut Ask your doctor how to clean your cut. You may  need to:  Use mild soap and water, or a wound cleanser.  Use a clean gauze to pat your cut dry after you clean it. Changing your bandage  Wash your hands with soap and water for at least 20 seconds before and after you change your bandage. If you cannot use soap and water, use hand sanitizer.  Change your bandage as told by your doctor.  Leave stitches, staples, skin glue, or skin tape strips in place. They may need to stay in place for 2 weeks or longer. If tape strips get loose and curl up, you may trim the loose edges. Do not remove tape strips completely unless your doctor says it is  okay.  Put a cream or ointment on your cut. Do this only as told.  Cover your cut with a clean bandage.  Ask your doctor when you can leave your cut uncovered. Checking for infection Check your cut area every day for signs of infection. Check for:  More redness, swelling, or pain.  More fluid or blood.  Warmth.  Pus or a bad smell.   Follow these instructions at home Medicines  Take over-the-counter and prescription medicines only as told by your doctor.  If you were prescribed an antibiotic medicine, cream, or ointment, use it as told by your doctor. Do not stop using the antibiotic even if your condition improves. Eating and drinking  Eat foods that have a lot of certain nutrients, such as protein, vitamin A, and vitamin C. These foods help your cut heal. ? Foods rich in protein include meat, fish, eggs, dairy, beans, and nuts. ? Foods rich in vitamin A include carrots and dark green, leafy vegetables. ? Foods rich in vitamin C include citrus fruits, tomatoes, broccoli, and peppers.  Drink enough fluid to keep your pee (urine) pale yellow. General instructions  Do not take baths, swim, use a hot tub, or put your cut underwater until your doctor approves. Ask your doctor if you may take showers. You may only be allowed to take sponge baths.  Limit movement around your cut. This helps with healing. ? Try not to strain, lift, or exercise for the first 2 weeks, or for as long as told by your doctor. ? Return to your normal activities as told by your doctor. Ask your doctor what activities are safe for you.  Do not scratch or pick at your cut. Keep it covered as told by your doctor.  Protect your cut from the sun when you are outside for the first 6 months, or for as long as told by your doctor. Cover up the scar area or put on sunscreen that has an SPF of at least 30.  Do not use any products that contain nicotine or tobacco, such as cigarettes, e-cigarettes, and chewing  tobacco. These can delay cut healing after surgery. If you need help quitting, ask your doctor.  Keep all follow-up visits as told by your doctor. This is important.   Contact a doctor if:  You have any of these signs of infection around your cut: ? More redness, swelling, or pain. ? More fluid or blood. ? Warmth. ? Pus or a bad smell.  You have a fever.  You feel like you may vomit (nauseous).  You vomit.  You are dizzy.  Your stitches, staples, skin glue, or tape strips come undone. Get help right away if:  Your cut has a red streak coming from it.  Your cut bleeds through your bandage, and  bleeding does not stop with gentle pressure.  Your cut opens up and comes apart.  Your body reacts very badly to an infection. This may include: ? A fever, chills, or feeling cold. ? Feeling mixed up, worried, or nervous. ? Very bad pain. ? Trouble breathing. ? A fast heartbeat. ? Clammy or sweaty skin. ? A rash. These symptoms may be an emergency. Do not wait to see if the symptoms will go away. Get medical help right away. Call your local emergency services (911 in the [place] Do not drive yourself to the hospital. Summary  Follow instructions from your doctor about how to care for your cut from surgery.  Wash your hands with soap and water for at least 20 seconds before and after you change your bandage. If you cannot use soap and water, use hand sanitizer.  Check your cut area every day for signs of infection.  Keep all follow-up visits as told by your doctor. This is important. This information is not intended to replace advice given to you by your health care provider. Make sure you discuss any questions you have with your health care provider. Document Revised: 06/23/2019 Document Reviewed: 06/23/2019 Elsevier Patient Education  2021 [employer].

## 2020-12-14 NOTE — Progress Notes (Signed)
I have seen, interviewed, and examined the patient in conjunction with the [facility] [employer] [facility] and affirm the diagnosis and management plan.   Gunnar Bulla, CNM [facility], [facility] 12/14/20 11:10 AM

## 2020-12-14 NOTE — Progress Notes (Signed)
    OBSTETRICS/GYNECOLOGY POST-OPERATIVE CLINIC VISIT  Subjective:     Cheryl Wong is a 25 y.o. female who presents to the clinic 1 weeks status post cesarean section for suspected macrosomia, gestational diabetes poorly controlled on oral meds.   Eating a regular diet without difficulty. Bowel movements are abnormal with use of stool softeners and enema.    Pain is controlled with current analgesics. Medications being used: acetaminophen, prescription NSAID's including ibuprofen (Motrin) and narcotic analgesics including Oxy-IR.  Breast and formula feeding  Patient reports checking blood sugars only in the morning and have been less then 100, but denies checking them after meals. Patient has not continued taking glyburide.  Request a refill for pain medication (oxy-IR).  Denies difficulty breathing, respiratory distress, chest pain, excessive vaginal bleeding, and leg pain or swelling.  Review of Systems  Pertinent items are noted in HPI.   Objective:    BP 114/76   Pulse 81   Temp 99.2 F (37.3 C) (Oral)   Resp 16   Ht 5\' 2"  (1.575 m)   Wt 70.3 kg   BMI 28.35 kg/m    General:  alert and no distress  Abdomen: soft, bowel sounds active, non-tender  Incision:   healing well, no drainage, no erythema, no hernia, no seroma, no swelling, no dehiscence, incision well approximated   Assessment:   Doing well postoperatively.   Plan:   Continue current medications, discussed rotating between ibuprofen and tylenol first.   Rx Oxycodone, 10 pills; no additional refills without appointment  Wound care discussed.  Discussed home treatment measures for constipation.  Discussed checking A1c after postpartum visit to make sure the GDM is gone.  Activity restrictions: no driving and no lifting more than 10 pounds  Reviewed red flag symptoms and when to call.  RTC x 5 week for 6 week postpartum visit with JML or sooner if needed.  ,  Student[employer] 12/14/20 10:03 AM

## 2020-12-18 ENCOUNTER — Encounter: Payer: Medicaid Other | Admitting: Certified Nurse Midwife

## 2020-12-29 ENCOUNTER — Other Ambulatory Visit: Payer: Self-pay

## 2020-12-29 ENCOUNTER — Ambulatory Visit
Admission: EM | Admit: 2020-12-29 | Discharge: 2020-12-29 | Disposition: A | Discharge status: Home / Self Care | Payer: Medicaid Other | Attending: Physician Assistant | Admitting: Physician Assistant

## 2020-12-29 ENCOUNTER — Encounter: Payer: Self-pay | Admitting: Emergency Medicine

## 2020-12-29 DIAGNOSIS — S61411A Laceration without foreign body of right hand, initial encounter: Secondary | ICD-10-CM | POA: Diagnosis not present

## 2020-12-29 NOTE — Discharge Instructions (Signed)
6 stitches placed today.  Return in 10 days to have them taken out.  At this time, make sure to keep the area clean and dry.  Do not get wet for 2 days and then clean the area that is not glued with soap and water and keep dry.  You can also keep bandage.  If you see any increased redness, swelling or pustular drainage or you develop a fever or increased pain, return for treatment for infection.  Take Tylenol for pain relief.  Follow-up with [place] in 10 days for suture removal or sooner for signs of infection.

## 2020-12-29 NOTE — ED Provider Notes (Signed)
[facility]    CSN: [id number] Arrival date & time: 12/29/20  1736      History   Chief Complaint Chief Complaint  Patient presents with  . Laceration    HPI Cheryl Wong is a 25 y.o. female presenting for laceration of the right hand since just prior to arrival to [facility].  Laceration is between the index finger and thumb, in the finger web.  Patient states that she was washing dishes and cut her hand on glass cup.  Patient put coffee in the wound to try to stop the bleeding.  She denies any significant pain, numbness or tingling.  No weakness.  Patient had a baby about 3 to 4 weeks ago so she states she is up-to-date with her tetanus.  She has no other complaints or concerns.  HPI  Past Medical History:  Diagnosis Date  . Acute cholecystitis 10/04/2016  . Asthma   . Biliary colic   . Dyshidrotic eczema   . Gestational diabetes   . Psoriasis   . RUQ pain     Patient Active Problem List   Diagnosis Date Noted  . History of gestational diabetes 12/14/2020  . S/P cesarean section 12/14/2020  . Gestational diabetes 12/08/2020  . Fetal macrosomia 12/08/2020  . Cesarean delivery delivered   . Type O blood, Rh positive 07/06/2020  . Postpartum hemorrhage 11/21/2016  . Acute blood loss anemia 11/21/2016  . Acute cholecystitis 10/04/2016  . RUQ pain   . Biliary colic     Past Surgical History:  Procedure Laterality Date  . CESAREAN SECTION N/A 12/08/2020   Procedure: CESAREAN SECTION;  Surgeon: Hildred Laser, MD;  Location: [facility] ORS;  Service: Obstetrics;  Laterality: N/A;  . NO PAST SURGERIES      OB History    Gravida  2   Para  2   Term  2   Preterm      AB      Living  2     SAB      IAB      Ectopic      Multiple  0   Live Births  2            Home Medications    Prior to Admission medications   Medication Sig Start Date End Date Taking? Authorizing Provider  Accu-Chek Softclix Lancets lancets Check blood sugars 4 times a day  before breakfast and 2 hours after every meal 11/27/20   Lawhorn, Vanessa Letona, CNM  acetaminophen (TYLENOL) 500 MG tablet Take 2 tablets (1,000 mg total) by mouth every 6 (six) hours as needed. 12/10/20   Hildred Laser, MD  docusate sodium (COLACE) 100 MG capsule Take 1 capsule (100 mg total) by mouth 2 (two) times daily as needed. 12/10/20   Hildred Laser, MD  ferrous sulfate 325 (65 FE) MG tablet Take 1 tablet (325 mg total) by mouth 2 (two) times daily with a meal. 12/10/20   Hildred Laser, MD  glucose blood test strip Check blood sugars 4 times a day-before breakfast and 2 hours after every meal 11/27/20   Lawhorn, Vanessa Verona, CNM  glyBURIDE (DIABETA) 5 MG tablet Take 0.5 tablets (2.5 mg total) by mouth 2 (two) times daily with a meal. 12/10/20   Hildred Laser, MD  ibuprofen (ADVIL) 800 MG tablet Take 1 tablet (800 mg total) by mouth every 8 (eight) hours as needed for mild pain. 12/10/20   Hildred Laser, MD  oxyCODONE (OXY IR/ROXICODONE) 5 MG  immediate release tablet Take 1-2 tablets (5-10 mg total) by mouth every 4 (four) hours as needed for moderate pain or severe pain. 12/14/20   Gunnar BullaLawhorn, Jenkins Michelle, CNM  Prenatal Vit-Fe Fumarate-FA (PRENATAL MULTIVITAMIN) TABS tablet Take 1 tablet by mouth daily at 12 noon.    [provider]  PROAIR HFA [phone](90 Base) MCG/ACT inhaler Inhale 2 puffs into the lungs every 6 (six) hours as needed for wheezing or shortness of breath. 05/02/20   [provider]    Family History Family History  Problem Relation Age of Onset  . Diabetes Mother   . Hypertension Father   . Diabetes Father   . Diabetes Brother     Social History Social History   Tobacco Use  . Smoking status: Never Smoker  . Smokeless tobacco: Never Used  Vaping Use  . Vaping Use: Never used  Substance Use Topics  . Alcohol use: No  . Drug use: No     Allergies   Patient has no known allergies.   Review of Systems Review of Systems  Musculoskeletal:  Negative for arthralgias and joint swelling.  Skin: Positive for wound.  Neurological: Negative for weakness and numbness.  Hematological: Does not bruise/bleed easily.     Physical Exam Triage Vital Signs ED Triage Vitals  Enc Vitals Group     BP 12/29/20 1802 110/73     Pulse Rate 12/29/20 1802 80     Resp 12/29/20 1802 18     Temp 12/29/20 1802 98.3 F (36.8 C)     Temp Source 12/29/20 1802 Oral     SpO2 12/29/20 1802 100 %     Weight 12/29/20 1801 154 lb 15.7 oz (70.3 kg)     Height 12/29/20 1801 5\' 2"  (1.575 m)     Head Circumference --      Peak Flow --      Pain Score 12/29/20 1800 10     Pain Loc --      Pain Edu? --      Excl. in GC? --    No data found.  Updated Vital Signs BP 110/73 (BP Location: Left Arm)   Pulse 80   Temp 98.3 F (36.8 C) (Oral)   Resp 18   Ht 5\' 2"  (1.575 m)   Wt 154 lb 15.7 oz (70.3 kg)   SpO2 100%   Breastfeeding Yes   BMI 28.35 kg/m       Physical Exam Vitals and nursing note reviewed.  Constitutional:      General: She is not in acute distress.    Appearance: Normal appearance. She is not ill-appearing or toxic-appearing.  HENT:     Head: Normocephalic and atraumatic.  Eyes:     General: No scleral icterus.       Right eye: No discharge.        Left eye: No discharge.     Conjunctiva/sclera: Conjunctivae normal.  Cardiovascular:     Rate and Rhythm: Normal rate and regular rhythm.     Pulses: Normal pulses.  Pulmonary:     Effort: Pulmonary effort is normal. No respiratory distress.  Musculoskeletal:     Cervical back: Neck supple.  Skin:    General: Skin is dry.     Findings: Lesion (2 cm laceration right hand, web between thumb and index finger. Coffee grounds in wound, mild bleeding) present.  Neurological:     General: No focal deficit present.     Mental Status: She is alert.  Mental status is at baseline.     Motor: No weakness.     Gait: Gait normal.  Psychiatric:        Mood and Affect: Mood normal.         Behavior: Behavior normal.        Thought Content: Thought content normal.      UC Treatments / Results  Labs (all labs ordered are listed, but only abnormal results are displayed) Labs Reviewed - No data to display  EKG   Radiology No results found.  Procedures Laceration Repair  Date/Time: 12/29/2020 7:24 PM Performed by: Shirlee Latch, PA-C Authorized by: Shirlee Latch, PA-C   Consent:    Consent obtained:  Verbal   Consent given by:  Patient   Risks discussed:  Infection, pain, retained foreign body, poor cosmetic result and poor wound healing Universal protocol:    Patient identity confirmed:  Verbally with patient and arm band Anesthesia:    Anesthesia method:  Local infiltration   Local anesthetic:  Lidocaine 1% w/o epi Laceration details:    Location:  Hand   Hand location:  R hand, dorsum   Length (cm):  2 Exploration:    Hemostasis achieved with:  Direct pressure   Wound exploration: wound explored through full range of motion     Contaminated: yes   Treatment:    Area cleansed with:  Saline   Amount of cleaning:  Standard   Irrigation solution:  Sterile saline   Irrigation method:  Pressure wash and syringe   Visualized foreign bodies/material removed: yes     Debridement:  Minimal   Undermining:  None Skin repair:    Repair method:  Sutures   Suture size:  5-0   Suture technique:  Simple interrupted   Number of sutures:  6 Approximation:    Approximation:  Close Repair type:    Repair type:  Simple Post-procedure details:    Dressing:  Non-adherent dressing   Procedure completion:  Tolerated well, no immediate complications Comments:     Also repaired small area with skin adhesive   (including critical care time)  Medications Ordered in UC Medications - No data to display  Initial Impression / Assessment and Plan / UC Course  I have reviewed the triage vital signs and the nursing notes.  Pertinent labs & imaging results that  were available during my care of the patient were reviewed by me and considered in my medical decision making (see chart for details).   25 y/o female presenting for laceration of right hand.   6 simple sutures placed as well as a small area of skin adhesive.  Patient tolerated well.  Reviewed wound care guidelines especially for sutured wound care and skin adhesive.  Advised to follow-up in 10 days for suture removal or sooner for any signs of infection.  Supportive care advised with Tylenol for discomfort.   Final Clinical Impressions(s) / UC Diagnoses   Final diagnoses:  Laceration of right hand without foreign body, initial encounter     Discharge Instructions     6 stitches placed today.  Return in 10 days to have them taken out.  At this time, make sure to keep the area clean and dry.  Do not get wet for 2 days and then clean the area that is not glued with soap and water and keep dry.  You can also keep bandage.  If you see any increased redness, swelling or pustular drainage or you develop  a fever or increased pain, return for treatment for infection.  Take Tylenol for pain relief.  Follow-up with [place] in 10 days for suture removal or sooner for signs of infection.    ED Prescriptions    None     PDMP not reviewed this encounter.   Shirlee Latch, PA-C 12/29/20 1930

## 2020-12-29 NOTE — ED Triage Notes (Signed)
Pt has laceration on the right hand between the thumb and index finger. She states she was washing dishes and cut her hand on a glass cup. This occurred right before coming to [facility]. Pt states she is UTD on Tetanus because she just had a baby.

## 2021-01-19 ENCOUNTER — Encounter: Payer: Self-pay | Admitting: Certified Nurse Midwife

## 2021-01-19 ENCOUNTER — Other Ambulatory Visit: Payer: Self-pay

## 2021-01-19 ENCOUNTER — Ambulatory Visit (INDEPENDENT_AMBULATORY_CARE_PROVIDER_SITE_OTHER): Payer: Medicaid Other | Admitting: Certified Nurse Midwife

## 2021-01-19 DIAGNOSIS — L709 Acne, unspecified: Secondary | ICD-10-CM | POA: Diagnosis not present

## 2021-01-19 DIAGNOSIS — Z98891 History of uterine scar from previous surgery: Secondary | ICD-10-CM | POA: Diagnosis not present

## 2021-01-19 DIAGNOSIS — Z3041 Encounter for surveillance of contraceptive pills: Secondary | ICD-10-CM

## 2021-01-19 DIAGNOSIS — R238 Other skin changes: Secondary | ICD-10-CM

## 2021-01-19 MED ORDER — NORETHINDRONE 0.35 MG PO TABS: 1.0000 | ORAL_TABLET | Freq: Every day | ORAL | 11 refills | Status: DC

## 2021-01-19 NOTE — Progress Notes (Signed)
Subjective:    Cheryl Wong is a 25 y.o. G2P2002 other or two or more races female who presents for a postpartum visit. She is 6 weeks postpartum following a primary cesarean section, low vertical incision at [redacted]w[redacted]d gestation due to uncontrolled gestational diabetes with estimated fetal weight greater than 4500 grams. Anesthesia: spinal. I have fully reviewed the prenatal and intrapartum course.   Postpartum course has been uncomplicated.   Baby's course has been uncomplicated. Baby is feeding by both breast and bottle - gerber.   Bleeding no bleeding. Bowel function is normal. Bladder function is normal.   Patient is sexually active. Last sexual activity: 3 days ago. Contraception method is none.   Postpartum depression screening: negative. Score 1/0.   Last pap 11/03/2019 and was Neg.  Patient denies difficulty breathing or respiratory distress, chest pain, constipation, and/or leg pain or swelling.  The following portions of the patient's history were reviewed and updated as appropriate: allergies, current medications, past medical history, past surgical history and problem list.  Review of Systems Pertinent items are noted in HPI.    Objective:   BP 109/77   Pulse 77   Resp 16   Ht 5\' 2"  (1.575 m)   Wt 142 lb 9.6 oz (64.7 kg)   BMI 26.08 kg/m   General:  alert, cooperative and no distress   Breasts:  deferred, no complaints  Lungs: clear to auscultation bilaterally  Heart:  regular rate and rhythm  Skin Facial acne on chin. Dark spots diffusely spread on chest and back  Abdomen: soft, nontender  Pelvic Exam Patient declined exam, no complaints        Depression screen [facility] 2/9 01/19/2021 12/14/2020 12/07/2020 11/21/2020  Decreased Interest 1 0 0 0  Down, Depressed, Hopeless 0 0 0 0  PHQ - 2 Score 1 0 0 0  Altered sleeping 0 - - -  Tired, decreased energy 0 - - -  Change in appetite 0 - - -  Feeling bad or failure about yourself  0 - - -  Trouble concentrating 0 - - -   Moving slowly or fidgety/restless 0 - - -  Suicidal thoughts 0 - - -  PHQ-9 Score 1 - - -  Difficult doing work/chores Not difficult at all - - -    GAD 7 : Generalized Anxiety Score 01/19/2021  Nervous, Anxious, on Edge 0  Control/stop worrying 0  Worry too much - different things 0  Trouble relaxing 0  Restless 0  Easily annoyed or irritable 0  Afraid - awful might happen 0  Total GAD 7 Score 0    Assessment:   Postpartum exam  6 wks s/p repeat cesarean section  Breast and bottle-feeding  Depression screening  Contraception counseling   Acne, unspecified  Skin color changes  Plan:   Contraception: oral progesterone-only contraceptive   Discussed using condoms for at least one month of pills, patient verbalized understanding.  Referral to dermatology  Rx: POP (Camilla)  Reviewed red flag symptoms and when to call.  RTC x 5-6 months for ANNUAL EXAM or sooner if needed  03/21/2021, Juliann Pares [employer] 01/19/21 11:14 AM

## 2021-01-19 NOTE — Patient Instructions (Signed)
Preventive Care 21-27 Years Old, Female Preventive care refers to lifestyle choices and visits with your health care provider that can promote health and wellness. This includes:  A yearly physical exam. This is also called an annual wellness visit.  Regular dental and eye exams.  Immunizations.  Screening for certain conditions.  Healthy lifestyle choices, such as: ? Eating a healthy diet. ? Getting regular exercise. ? Not using drugs or products that contain nicotine and tobacco. ? Limiting alcohol use. What can I expect for my preventive care visit? Physical exam Your health care provider may check your:  Height and weight. These may be used to calculate your BMI (body mass index). BMI is a measurement that tells if you are at a healthy weight.  Heart rate and blood pressure.  Body temperature.  Skin for abnormal spots. Counseling Your health care provider may ask you questions about your:  Past medical problems.  Family's medical history.  Alcohol, tobacco, and drug use.  Emotional well-being.  Home life and relationship well-being.  Sexual activity.  Diet, exercise, and sleep habits.  Work and work environment.  Access to firearms.  Method of birth control.  Menstrual cycle.  Pregnancy history. What immunizations do I need? Vaccines are usually given at various ages, according to a schedule. Your health care provider will recommend vaccines for you based on your age, medical history, and lifestyle or other factors, such as travel or where you work.   What tests do I need? Blood tests  Lipid and cholesterol levels. These may be checked every 5 years starting at age 20.  Hepatitis C test.  Hepatitis B test. Screening  Diabetes screening. This is done by checking your blood sugar (glucose) after you have not eaten for a while (fasting).  STD (sexually transmitted disease) testing, if you are at risk.  BRCA-related cancer screening. This may be  done if you have a family history of breast, ovarian, tubal, or peritoneal cancers.  Pelvic exam and Pap test. This may be done every 3 years starting at age 21. Starting at age 30, this may be done every 5 years if you have a Pap test in combination with an HPV test. Talk with your health care provider about your test results, treatment options, and if necessary, the need for more tests.   Follow these instructions at home: Eating and drinking  Eat a healthy diet that includes fresh fruits and vegetables, whole grains, lean protein, and low-fat dairy products.  Take vitamin and mineral supplements as recommended by your health care provider.  Do not drink alcohol if: ? Your health care provider tells you not to drink. ? You are pregnant, may be pregnant, or are planning to become pregnant.  If you drink alcohol: ? Limit how much you have to 0-1 drink a day. ? Be aware of how much alcohol is in your drink. In the [place] one drink equals one 12 oz bottle of beer (355 mL), one 5 oz glass of wine (148 mL), or one 1 oz glass of hard liquor (44 mL).   Lifestyle  Take daily care of your teeth and gums. Brush your teeth every morning and night with fluoride toothpaste. Floss one time each day.  Stay active. Exercise for at least 30 minutes 5 or more days each week.  Do not use any products that contain nicotine or tobacco, such as cigarettes, e-cigarettes, and chewing tobacco. If you need help quitting, ask your health care provider.  Do not   use drugs.  If you are sexually active, practice safe sex. Use a condom or other form of protection to prevent STIs (sexually transmitted infections).  If you do not wish to become pregnant, use a form of birth control. If you plan to become pregnant, see your health care provider for a prepregnancy visit.  Find healthy ways to cope with stress, such as: ? Meditation, yoga, or listening to music. ? Journaling. ? Talking to a trusted  person. ? Spending time with friends and family. Safety  Always wear your seat belt while driving or riding in a vehicle.  Do not drive: ? If you have been drinking alcohol. Do not ride with someone who has been drinking. ? When you are tired or distracted. ? While texting.  Wear a helmet and other protective equipment during sports activities.  If you have firearms in your house, make sure you follow all gun safety procedures.  Seek help if you have been physically or sexually abused. What's next?  Go to your health care provider once a year for an annual wellness visit.  Ask your health care provider how often you should have your eyes and teeth checked.  Stay up to date on all vaccines. This information is not intended to replace advice given to you by your health care provider. Make sure you discuss any questions you have with your health care provider. Document Revised: 04/30/2020 Document Reviewed: 05/14/2018 Elsevier Patient Education  2021 Elsevier Inc.  

## 2021-01-19 NOTE — Progress Notes (Signed)
I have seen, interviewed, and examined the patient in conjunction with the [employer] and affirm the diagnosis and management plan.   Gunnar Bulla, CNM [facility], [facility] 01/19/21 4:45 PM

## 2021-01-26 ENCOUNTER — Encounter: Payer: Self-pay | Admitting: Obstetrics and Gynecology

## 2021-02-20 ENCOUNTER — Telehealth: Payer: Self-pay | Admitting: Obstetrics and Gynecology

## 2021-02-20 NOTE — Telephone Encounter (Signed)
Pt states that she was told to call when she stopped breast feeding to call to switch birth control- pt stopped breast feeding 2 weeks ago, pharmacy [facility] in [place]

## 2021-02-21 ENCOUNTER — Other Ambulatory Visit: Payer: Self-pay | Admitting: Certified Nurse Midwife

## 2021-02-21 DIAGNOSIS — Z3041 Encounter for surveillance of contraceptive pills: Secondary | ICD-10-CM

## 2021-02-21 MED ORDER — NORGESTIM-ETH ESTRAD TRIPHASIC 0.18/0.215/0.25 MG-35 MCG PO TABS: 1.0000 | ORAL_TABLET | Freq: Every day | ORAL | 4 refills | Status: DC

## 2021-02-21 NOTE — Telephone Encounter (Signed)
Prescription sent for Tri-Sprintec to pharmacy on file. Thanks, JML

## 2021-02-21 NOTE — Progress Notes (Signed)
Rx TriSprintec, see orders.    Cheryl Wong, CNM [facility], [facility] 02/21/21 1:41 PM

## 2021-04-23 ENCOUNTER — Other Ambulatory Visit: Payer: Self-pay

## 2021-04-23 ENCOUNTER — Ambulatory Visit
Admission: EM | Admit: 2021-04-23 | Discharge: 2021-04-23 | Disposition: A | Discharge status: Home / Self Care | Payer: Medicaid Other | Attending: Family Medicine | Admitting: Family Medicine

## 2021-04-23 ENCOUNTER — Telehealth: Payer: Self-pay | Admitting: Obstetrics and Gynecology

## 2021-04-23 DIAGNOSIS — H60502 Unspecified acute noninfective otitis externa, left ear: Secondary | ICD-10-CM

## 2021-04-23 MED ORDER — IBUPROFEN 800 MG PO TABS
800.0000 mg | ORAL_TABLET | Freq: Once | ORAL | Status: AC
  Administered 2021-04-23: 800 mg via ORAL

## 2021-04-23 MED ORDER — CIPROFLOXACIN-DEXAMETHASONE 0.3-0.1 % OT SUSP: 4.0000 [drp] | Freq: Two times a day (BID) | OTIC | 0 refills | Status: DC

## 2021-04-23 NOTE — ED Provider Notes (Signed)
[facility]    CSN: [id number] Arrival date & time: 04/23/21  1147      History   Chief Complaint Ear pain  HPI 25 year old female presents with left ear pain.  She states that her left ear has been bothering her since last week but worsened over the past 2 days.  Pain is sharp.  No known inciting factor.  She has tried some CBD cream without relief.  No fever.  No respiratory symptoms.  Pain 9/10 in severity.  No other complaints at this time.  Past Medical History:  Diagnosis Date   Acute cholecystitis 10/04/2016   Asthma    Biliary colic    Dyshidrotic eczema    Gestational diabetes    Psoriasis    RUQ pain     Patient Active Problem List   Diagnosis Date Noted   History of gestational diabetes 12/14/2020   S/P cesarean section 12/14/2020   Gestational diabetes 12/08/2020   Fetal macrosomia 12/08/2020   Cesarean delivery delivered    Type O blood, Rh positive 07/06/2020   Postpartum hemorrhage 11/21/2016   Acute blood loss anemia 11/21/2016   Acute cholecystitis 10/04/2016   RUQ pain    Biliary colic     Past Surgical History:  Procedure Laterality Date   CESAREAN SECTION N/A 12/08/2020   Procedure: CESAREAN SECTION;  Surgeon: Hildred Laser, MD;  Location: [facility] ORS;  Service: Obstetrics;  Laterality: N/A;   NO PAST SURGERIES      OB History     Gravida  2   Para  2   Term  2   Preterm      AB      Living  2      SAB      IAB      Ectopic      Multiple  0   Live Births  2            Home Medications    Prior to Admission medications   Medication Sig Start Date End Date Taking? Authorizing Provider  ciprofloxacin-dexamethasone (CIPRODEX) OTIC suspension Place 4 drops into the left ear 2 (two) times daily for 10 days. 04/23/21 05/03/21 Yes Kalan Yeley, Verdis Frederickson, DO  Norgestimate-Ethinyl Estradiol Triphasic (TRI-SPRINTEC) 0.18/0.215/0.25 MG-35 MCG tablet Take 1 tablet by mouth daily. 02/21/21  Yes Lawhorn, Vanessa [place], CNM     Family History Family History  Problem Relation Age of Onset   Diabetes Mother    Hypertension Father    Diabetes Father    Diabetes Brother     Social History Social History   Tobacco Use   Smoking status: Never   Smokeless tobacco: Never  Vaping Use   Vaping Use: Never used  Substance Use Topics   Alcohol use: No   Drug use: No     Allergies   Patient has no known allergies.   Review of Systems Review of Systems  Constitutional: Negative.   HENT:  Positive for ear pain.     Physical Exam Triage Vital Signs ED Triage Vitals  Enc Vitals Group     BP 04/23/21 1249 120/77     Pulse Rate 04/23/21 1249 95     Resp 04/23/21 1249 16     Temp 04/23/21 1249 98.4 F (36.9 C)     Temp Source 04/23/21 1249 Oral     SpO2 04/23/21 1249 100 %     Weight 04/23/21 1246 145 lb (65.8 kg)     Height 04/23/21 1246  5\' 2"  (1.575 m)     Head Circumference --      Peak Flow --      Pain Score 04/23/21 1246 9     Pain Loc --      Pain Edu? --      Excl. in GC? --    Updated Vital Signs BP 120/77 (BP Location: Left Arm)   Pulse 95   Temp 98.4 F (36.9 C) (Oral)   Resp 16   Ht 5\' 2"  (1.575 m)   Wt 65.8 kg   LMP 04/16/2021   SpO2 100%   BMI 26.52 kg/m   Visual Acuity Right Eye Distance:   Left Eye Distance:   Bilateral Distance:    Right Eye Near:   Left Eye Near:    Bilateral Near:     Physical Exam Vitals and nursing note reviewed.  Constitutional:      General: She is not in acute distress.    Appearance: Normal appearance. She is not ill-appearing.  HENT:     Head: Normocephalic and atraumatic.     Ears:     Comments: Left ear with tragal tenderness.  Canal edema and debris noted. Eyes:     General:        Right eye: No discharge.        Left eye: No discharge.     Conjunctiva/sclera: Conjunctivae normal.  Pulmonary:     Effort: Pulmonary effort is normal. No respiratory distress.  Neurological:     Mental Status: She is alert.     UC  Treatments / Results  Labs (all labs ordered are listed, but only abnormal results are displayed) Labs Reviewed - No data to display  EKG   Radiology No results found.  Procedures Procedures (including critical care time)  Medications Ordered in UC Medications  ibuprofen (ADVIL) tablet 800 mg (800 mg Oral Given 04/23/21 1315)    Initial Impression / Assessment and Plan / UC Course  I have reviewed the triage vital signs and the nursing notes.  Pertinent labs & imaging results that were available during my care of the patient were reviewed by me and considered in my medical decision making (see chart for details).    25 year old female presents with otitis externa.  Treating with Ciprodex.  Final Clinical Impressions(s) / UC Diagnoses   Final diagnoses:  Acute otitis externa of left ear, unspecified type   Discharge Instructions   None    ED Prescriptions     Medication Sig Dispense Auth. Provider   ciprofloxacin-dexamethasone (CIPRODEX) OTIC suspension Place 4 drops into the left ear 2 (two) times daily for 10 days. 7.5 mL 06/23/21, DO      PDMP not reviewed this encounter.   30, Tommie Sams 04/23/21 1354

## 2021-04-23 NOTE — Telephone Encounter (Signed)
Pt called asking to be seen by Dr.Cherry for an ear infection. I made her aware that we are OBGYN asked if she had a PCP she said no that she is seen by Dr.Cherry. I made her aware that we do not treat for ear infections, made pt aware that Dr.Cherry would not be her for over a week and that she needs to see a PCP or Urgent Care to have ear treated. Pt was adamant that I sent cherry message. Please Advise.

## 2021-04-23 NOTE — ED Triage Notes (Signed)
Pt here with C/O Left ear pain for about a week, today is the worst pain feels like a needle being poked in it. Tried CBD cream with no relief. States she had the same thing 2 years ago, was given drops and antibiotics and it helped.

## 2021-04-25 NOTE — Telephone Encounter (Signed)
Pt called no answer lm via vm that I had received her message concerning having an ear infection. Informed pt that [facility] was out of the office until next week and advised pt to see her PCP or go to Urgent Care for treatment. Pt was advised to contact the office if she would like to discuss any concerns.

## 2021-06-22 ENCOUNTER — Encounter: Payer: Medicaid Other | Admitting: Obstetrics and Gynecology

## 2021-06-27 ENCOUNTER — Encounter: Payer: Self-pay | Admitting: Obstetrics and Gynecology

## 2021-06-27 ENCOUNTER — Other Ambulatory Visit (HOSPITAL_COMMUNITY)
Admission: RE | Admit: 2021-06-27 | Discharge: 2021-06-27 | Disposition: A | Discharge status: Home / Self Care | Payer: Medicaid Other | Source: Ambulatory Visit | Attending: Obstetrics and Gynecology | Admitting: Obstetrics and Gynecology

## 2021-06-27 ENCOUNTER — Other Ambulatory Visit: Payer: Self-pay

## 2021-06-27 ENCOUNTER — Ambulatory Visit (INDEPENDENT_AMBULATORY_CARE_PROVIDER_SITE_OTHER): Payer: Medicaid Other | Admitting: Obstetrics and Gynecology

## 2021-06-27 VITALS — BP 91/71 | HR 91 | Resp 16 | Ht 62.0 in | Wt 157.1 lb

## 2021-06-27 DIAGNOSIS — Z01419 Encounter for gynecological examination (general) (routine) without abnormal findings: Secondary | ICD-10-CM

## 2021-06-27 DIAGNOSIS — N898 Other specified noninflammatory disorders of vagina: Secondary | ICD-10-CM

## 2021-06-27 DIAGNOSIS — E663 Overweight: Secondary | ICD-10-CM

## 2021-06-27 DIAGNOSIS — Z8632 Personal history of gestational diabetes: Secondary | ICD-10-CM

## 2021-06-27 DIAGNOSIS — Z13220 Encounter for screening for lipoid disorders: Secondary | ICD-10-CM | POA: Diagnosis not present

## 2021-06-27 DIAGNOSIS — R3915 Urgency of urination: Secondary | ICD-10-CM

## 2021-06-27 DIAGNOSIS — H00014 Hordeolum externum left upper eyelid: Secondary | ICD-10-CM

## 2021-06-27 DIAGNOSIS — Z131 Encounter for screening for diabetes mellitus: Secondary | ICD-10-CM

## 2021-06-27 LAB — POCT URINALYSIS DIPSTICK
Bilirubin, UA: NEGATIVE
Glucose, UA: NEGATIVE
Ketones, UA: NEGATIVE
Nitrite, UA: NEGATIVE
Protein, UA: NEGATIVE
Spec Grav, UA: 1.01 (ref 1.010–1.025)
Urobilinogen, UA: 0.2 E.U./dL
pH, UA: 7 (ref 5.0–8.0)

## 2021-06-27 NOTE — Progress Notes (Signed)
GYNECOLOGY ANNUAL PHYSICAL EXAM PROGRESS NOTE  Subjective:    Cheryl Wong is a 25 y.o. G80P2002 female who presents for an annual exam. Previously a patient of Cheryl Wong, CNM. The patient is sexually active. The patient participates in regular exercise: yes., 1-3 times weekly. Has the patient ever been transfused or tattooed?: Yes transfusion. The patient reports that there is not domestic violence in her life.   The patient has the following complaints today.  She has white-yellowish discharge with odor for several weeks. Also has some lower abdominal pain, with urinary urgency and frequency. Additionally experiencing She has been having pain with intercourse. She complains that she is having difficulty loosing weight. Notes that she exercises sometimes.  Has not really made any changes to her diet. Does note that she consumes a moderate amount of carbohydrates (white rice, bread, noodles). Asks if she can be given something to help lose weight.  Additionally, complains of issues with constipation.  Reports that she only drinks approximately 1-2 bottles per day of water.  Lastly, patient reports having a stye on her left eye.   Gynecologic History:  Menarche age: 49 Patient's last menstrual period was 06/14/2021 (approximate). Contraception: OCP (estrogen/progesterone) History of STI's: Denies Last Pap: 12/07/2019. Results were: normal.  Denies h/o abnormal pap smears. Last mammogram: Not age appropriate   OB History  Gravida Para Term Preterm AB Living  2 2 2  0 0 2  SAB IAB Ectopic Multiple Live Births  0 0 0 0 2    # Outcome Date GA Lbr Len/2nd Weight Sex Delivery Anes PTL Lv  2 Term 12/08/20 [redacted]w[redacted]d  9 lb 14.7 oz (4.5 kg) F CS-LTranv Spinal  LIV     Name: Cheryl Wong     Apgar1: 8  Apgar5: 9  1 Term 11/20/16 [redacted]w[redacted]d / 02:47 7 lb 10.1 oz (3.46 kg) M Vag-Forceps EPI, Local  LIV     Name: [redacted]w[redacted]d     Apgar1: 2  Apgar5: 8    Past Medical History:   Diagnosis Date   Acute cholecystitis 10/04/2016   Asthma    Biliary colic    Dyshidrotic eczema    Gestational diabetes    Psoriasis    RUQ pain     Past Surgical History:  Procedure Laterality Date   CESAREAN SECTION N/A 12/08/2020   Procedure: CESAREAN SECTION;  Surgeon: 12/10/2020, MD;  Location: [facility] ORS;  Service: Obstetrics;  Laterality: N/A;   NO PAST SURGERIES      Family History  Problem Relation Age of Onset   Diabetes Mother    Hypertension Father    Diabetes Father    Diabetes Brother     Social History   Socioeconomic History   Marital status: Married    Spouse name: Not on file   Number of children: Not on file   Years of education: Not on file   Highest education level: Not on file  Occupational History   Not on file  Tobacco Use   Smoking status: Never   Smokeless tobacco: Never  Vaping Use   Vaping Use: Never used  Substance and Sexual Activity   Alcohol use: No   Drug use: No   Sexual activity: Yes    Birth control/protection: Pill  Other Topics Concern   Not on file  Social History Narrative   Not on file   Social Determinants of Health   Financial Resource Strain: Not on file  Food Insecurity: Not on  file  Transportation Needs: Not on file  Physical Activity: Not on file  Stress: Not on file  Social Connections: Not on file  Intimate Partner Violence: Not on file    Current Outpatient Medications on File Prior to Visit  Medication Sig Dispense Refill   Norgestimate-Ethinyl Estradiol Triphasic (TRI-SPRINTEC) 0.18/0.215/0.25 MG-35 MCG tablet Take 1 tablet by mouth daily. 84 tablet 4   No current facility-administered medications on file prior to visit.    No Known Allergies   Review of Systems Constitutional: negative for chills, fatigue, fevers and sweats Eyes: negative for irritation, redness and visual disturbance Ears, nose, mouth, throat, and face: negative for hearing loss, nasal congestion, snoring and  tinnitus Respiratory: negative for asthma, cough, sputum Cardiovascular: negative for chest pain, dyspnea, exertional chest pressure/discomfort, irregular heart beat, palpitations and syncope Gastrointestinal: positive for abdominal pain, constipation. Negative for nausea and vomiting Genitourinary: negative for abnormal menstrual periods, genital lesions, and urinary incontinence.  Positive for vaginal discharge, dysuria, pain with intercourse Integument/breast: negative for breast lump, breast tenderness and nipple discharge Hematologic/lymphatic: negative for bleeding and easy bruising Musculoskeletal:negative for back pain and muscle weakness Neurological: negative for dizziness, headaches, vertigo and weakness Endocrine: negative for diabetic symptoms including polydipsia, polyuria and skin dryness Allergic/Immunologic: negative for hay fever and urticaria      Objective:  Blood pressure 91/71, pulse 91, resp. rate 16, height 5\' 2"  (1.575 m), weight 157 lb 1.6 oz (71.3 kg), last menstrual period 06/14/2021, currently breastfeeding. Body mass index is 28.73 kg/m.   General Appearance:    Alert, cooperative, no distress, appears stated age, overweight  Head:    Normocephalic, without obvious abnormality, atraumatic  Eyes:    PERRL, conjunctiva/corneas clear, EOM's intact, both eyes. Stye on left eye.   Ears:    Normal external ear canals, both ears  Nose:   Nares normal, septum midline, mucosa normal, no drainage or sinus tenderness  Throat:   Lips, mucosa, and tongue normal; teeth and gums normal  Neck:   Supple, symmetrical, trachea midline, no adenopathy; thyroid: no enlargement/tenderness/nodules; no carotid bruit or JVD  Back:     Symmetric, no curvature, ROM normal, no CVA tenderness  Lungs:     Clear to auscultation bilaterally, respirations unlabored  Chest Wall:    No tenderness or deformity   Heart:    Regular rate and rhythm, S1 and S2 normal, no murmur, rub or gallop   Breast Exam:    No tenderness, masses, or nipple abnormality  Abdomen:     Soft, non-tender, bowel sounds active all four quadrants, no masses, no organomegaly.    Genitalia:    Pelvic:external genitalia normal, vagina without lesions, or tenderness, rectovaginal septum normal. Moderate yellow-white curd-like discharge. Cervix normal in appearance, no cervical motion tenderness, no adnexal masses or tenderness.  Uterus normal size, shape, mobile, regular contours, nontender.  Rectal:    Normal external sphincter.  No hemorrhoids appreciated. Internal exam not done.   Extremities:   Extremities normal, atraumatic, no cyanosis or edema  Pulses:   2+ and symmetric all extremities  Skin:   Skin color, texture, turgor normal, no rashes or lesions  Lymph nodes:   Cervical, supraclavicular, and axillary nodes normal  Neurologic:   CNII-XII intact, normal strength, sensation and reflexes throughout   .  Labs:  Lab Results  Component Value Date   WBC 11.8 (H) 12/09/2020   HGB 8.4 (L) 12/09/2020   HCT 25.0 (L) 12/09/2020   MCV 85.7 12/09/2020  PLT 193 12/09/2020    Lab Results  Component Value Date   CREATININE <0.30 (L) 08/02/2020   BUN <5 (L) 08/02/2020   NA 139 08/02/2020   K 3.8 08/02/2020   CL 105 08/02/2020   CO2 23 08/02/2020    Lab Results  Component Value Date   ALT 12 08/02/2020   AST 16 08/02/2020   ALKPHOS 53 08/02/2020   BILITOT 0.5 08/02/2020    No results found for: TSH   Urinalysis Results for orders placed or performed in visit on 06/27/21  POCT Urinalysis Dipstick  Result Value Ref Range   Color, UA     Clarity, UA     Glucose, UA Negative Negative   Bilirubin, UA Negative    Ketones, UA Negative    Spec Grav, UA 1.010 1.010 - 1.025   Blood, UA Trace    pH, UA 7.0 5.0 - 8.0   Protein, UA Negative Negative   Urobilinogen, UA 0.2 0.2 or 1.0 E.U./dL   Nitrite, UA Negative    Leukocytes, UA Moderate (2+) (A) Negative   Appearance     Odor       Assessment:   1. Well woman exam with routine gynecological exam   2. Urinary urgency   3. Vaginal itching   4. Screening cholesterol level   5. Screening for diabetes mellitus (DM)   6. History of gestational diabetes   7. Over weight   8. Hordeolum externum of left upper eyelid      Plan:  - Blood tests: see orders - Breast self exam technique reviewed and patient encouraged to perform self-exam monthly. - Contraception: OCP (estrogen/progesterone). - Discussed healthy lifestyle modifications. Advised that her BMI does not meet qualifications at this time for prescription weight loss meds, but can take OTC supplements. Also offered referral to Nutritionist but patient declines, notes she went during her last pregnancy for DM.  - Pap smear  UTD . - COVID vaccination status: Declined - Flu vaccine: Declined.  - History of GDM, will assess HgbA1c.  - Nuswab performed for vaginal discharge, however by exam, is likely yeast. Will prescribe Diflucan.  - UA without nitrites, but + for blood and leukocytes. No vaginal bleeding present. Will order culture.  - Stye noted on left eye, advised on home treatments.  - Follow up in 1 year for annual exam   Ilona Sorrel, MD [facility]

## 2021-06-27 NOTE — Patient Instructions (Addendum)
Medications:   You have a yeast infection.  Please take your prescription of Diflucan as prescribed. You can pick this up from your pharmacy today  Medications over the counter to help with weight loss include:  Herbal - Garcinia [place], Green tea extract, coffee extract Non-herbal - Hydroxycut, Alli  Medications for constipation include increasing fiber and water intake, stool softeners (Colace or Sena-Kot)

## 2021-06-28 ENCOUNTER — Encounter: Payer: Self-pay | Admitting: Obstetrics and Gynecology

## 2021-06-28 LAB — COMPREHENSIVE METABOLIC PANEL
ALT: 22 IU/L (ref 0–32)
AST: 19 IU/L (ref 0–40)
Albumin/Globulin Ratio: 1.4 (ref 1.2–2.2)
Albumin: 4.2 g/dL (ref 3.9–5.0)
Alkaline Phosphatase: 66 IU/L (ref 44–121)
BUN/Creatinine Ratio: 17 (ref 9–23)
BUN: 8 mg/dL (ref 6–20)
Bilirubin Total: <0.2 mg/dL (ref 0.0–1.2)
CO2: 21 mmol/L (ref 20–29)
Calcium: 9.4 mg/dL (ref 8.7–10.2)
Chloride: 104 mmol/L (ref 96–106)
Creatinine, Ser: 0.47 mg/dL — ABNORMAL LOW (ref 0.57–1.00)
Globulin, Total: 3 g/dL (ref 1.5–4.5)
Glucose: 72 mg/dL (ref 70–99)
Potassium: 4.9 mmol/L (ref 3.5–5.2)
Sodium: 139 mmol/L (ref 134–144)
Total Protein: 7.2 g/dL (ref 6.0–8.5)
eGFR: 135 mL/min/{1.73_m2} (ref >59)

## 2021-06-28 LAB — CBC
Hematocrit: 38.8 % (ref 34.0–46.6)
Hemoglobin: 13.4 g/dL (ref 11.1–15.9)
MCH: 29.2 pg (ref 26.6–33.0)
MCHC: 34.5 g/dL (ref 31.5–35.7)
MCV: 85 fL (ref 79–97)
Platelets: 280 10*3/uL (ref 150–450)
RBC: 4.59 x10E6/uL (ref 3.77–5.28)
RDW: 12.9 % (ref 11.7–15.4)
WBC: 7.2 10*3/uL (ref 3.4–10.8)

## 2021-06-28 LAB — HEMOGLOBIN A1C
Est. average glucose Bld gHb Est-mCnc: 103 mg/dL
Hgb A1c MFr Bld: 5.2 % (ref 4.8–5.6)

## 2021-06-28 LAB — LIPID PANEL
Chol/HDL Ratio: 4.3 ratio (ref 0.0–4.4)
Cholesterol, Total: 223 mg/dL — ABNORMAL HIGH (ref 100–199)
HDL: 52 mg/dL (ref >39)
LDL Chol Calc (NIH): 131 mg/dL — ABNORMAL HIGH (ref 0–99)
Triglycerides: 227 mg/dL — ABNORMAL HIGH (ref 0–149)
VLDL Cholesterol Cal: 40 mg/dL (ref 5–40)

## 2021-06-28 LAB — TSH: TSH: 1.46 u[IU]/mL (ref 0.450–4.500)

## 2021-06-28 MED ORDER — FLUCONAZOLE 150 MG PO TABS: 150.0000 mg | ORAL_TABLET | Freq: Once | ORAL | 3 refills | Status: AC

## 2021-06-29 LAB — CERVICOVAGINAL ANCILLARY ONLY
Bacterial Vaginitis (gardnerella): POSITIVE — AB
Candida Glabrata: NEGATIVE
Candida Vaginitis: POSITIVE — AB
Comment: NEGATIVE
Comment: NEGATIVE
Comment: NEGATIVE

## 2021-06-30 LAB — URINE CULTURE

## 2021-07-02 MED ORDER — METRONIDAZOLE 500 MG PO TABS: 500.0000 mg | ORAL_TABLET | Freq: Two times a day (BID) | ORAL | 0 refills | Status: DC

## 2021-07-02 MED ORDER — FLUCONAZOLE 150 MG PO TABS: 150.0000 mg | ORAL_TABLET | Freq: Once | ORAL | 1 refills | Status: AC

## 2021-07-02 NOTE — Addendum Note (Signed)
Addended by: Tommie Raymond on: 07/02/2021 03:24 PM   Modules accepted: Orders

## 2021-07-15 ENCOUNTER — Encounter: Payer: Self-pay | Admitting: Nurse Practitioner

## 2021-07-15 DIAGNOSIS — L709 Acne, unspecified: Secondary | ICD-10-CM | POA: Insufficient documentation

## 2021-07-16 ENCOUNTER — Other Ambulatory Visit: Payer: Self-pay

## 2021-07-16 ENCOUNTER — Ambulatory Visit (INDEPENDENT_AMBULATORY_CARE_PROVIDER_SITE_OTHER): Payer: Medicaid Other | Admitting: Nurse Practitioner

## 2021-07-16 ENCOUNTER — Encounter: Payer: Self-pay | Admitting: Nurse Practitioner

## 2021-07-16 VITALS — BP 105/67 | HR 85 | Temp 97.6°F | Ht 62.0 in | Wt 155.0 lb

## 2021-07-16 DIAGNOSIS — L700 Acne vulgaris: Secondary | ICD-10-CM | POA: Diagnosis not present

## 2021-07-16 DIAGNOSIS — L301 Dyshidrosis [pompholyx]: Secondary | ICD-10-CM | POA: Insufficient documentation

## 2021-07-16 DIAGNOSIS — H00014 Hordeolum externum left upper eyelid: Secondary | ICD-10-CM

## 2021-07-16 DIAGNOSIS — Z7689 Persons encountering health services in other specified circumstances: Secondary | ICD-10-CM

## 2021-07-16 DIAGNOSIS — E7800 Pure hypercholesterolemia, unspecified: Secondary | ICD-10-CM | POA: Insufficient documentation

## 2021-07-16 DIAGNOSIS — H00016 Hordeolum externum left eye, unspecified eyelid: Secondary | ICD-10-CM | POA: Insufficient documentation

## 2021-07-16 DIAGNOSIS — Z8632 Personal history of gestational diabetes: Secondary | ICD-10-CM

## 2021-07-16 MED ORDER — ERYTHROMYCIN 5 MG/GM OP OINT: 1.0000 "application " | TOPICAL_OINTMENT | Freq: Every day | OPHTHALMIC | 0 refills | Status: AC

## 2021-07-16 MED ORDER — CLOBETASOL PROPIONATE 0.05 % EX OINT: 1.0000 "application " | TOPICAL_OINTMENT | Freq: Two times a day (BID) | CUTANEOUS | 2 refills | Status: DC

## 2021-07-16 NOTE — Patient Instructions (Addendum)
[facility] [address], [place], [place] [address]  Prediabetes Eating Plan Prediabetes is a condition that causes blood sugar (glucose) levels to be higher than normal. This increases the risk for developing type 2 diabetes (type 2 diabetes mellitus). Working with a health care provider or nutrition specialist (dietitian) to make diet and lifestyle changes can help prevent the onset of diabetes. These changes may help you: Control your blood glucose levels. Improve your cholesterol levels. Manage your blood pressure. What are tips for following this plan? Reading food labels Read food labels to check the amount of fat, salt (sodium), and sugar in prepackaged foods. Avoid foods that have: Saturated fats. Trans fats. Added sugars. Avoid foods that have more than 300 milligrams (mg) of sodium per serving. Limit your sodium intake to less than 2,300 mg each day. Shopping Avoid buying pre-made and processed foods. Avoid buying drinks with added sugar. Cooking Cook with olive oil. Do not use butter, lard, or ghee. Bake, broil, grill, steam, or boil foods. Avoid frying. Meal planning  Work with your dietitian to create an eating plan that is right for you. This may include tracking how many calories you take in each day. Use a food diary, notebook, or mobile application to track what you eat at each meal. Consider following a Mediterranean diet. This includes: Eating several servings of fresh fruits and vegetables each day. Eating fish at least twice a week. Eating one serving each day of whole grains, beans, nuts, and seeds. Using olive oil instead of other fats. Limiting alcohol. Limiting red meat. Using nonfat or low-fat dairy products. Consider following a plant-based diet. This includes dietary choices that focus on eating mostly vegetables and fruit, grains, beans, nuts, and seeds. If you have high blood pressure, you may need to limit your sodium intake  or follow a diet such as the DASH (Dietary Approaches to Stop Hypertension) eating plan. The DASH diet aims to lower high blood pressure. Lifestyle Set weight loss goals with help from your health care team. It is recommended that most people with prediabetes lose 7% of their body weight. Exercise for at least 30 minutes 5 or more days a week. Attend a support group or seek support from a mental health counselor. Take over-the-counter and prescription medicines only as told by your health care provider. What foods are recommended? Fruits Berries. Bananas. Apples. Oranges. Grapes. Papaya. Mango. Pomegranate. Kiwi. Grapefruit. Cherries. Vegetables Lettuce. Spinach. Peas. Beets. Cauliflower. Cabbage. Broccoli. Carrots. Tomatoes. Squash. Eggplant. Herbs. Peppers. Onions. Cucumbers. Brussels sprouts. Grains Whole grains, such as whole-wheat or whole-grain breads, crackers, cereals, and pasta. Unsweetened oatmeal. Bulgur. Barley. Quinoa. Brown rice. Corn or whole-wheat flour tortillas or taco shells. Meats and other proteins Seafood. Poultry without skin. Lean cuts of pork and beef. Tofu. Eggs. Nuts. Beans. Dairy Low-fat or fat-free dairy products, such as yogurt, cottage cheese, and cheese. Beverages Water. Tea. Coffee. Sugar-free or diet soda. Seltzer water. Low-fat or nonfat milk. Milk alternatives, such as soy or almond milk. Fats and oils Olive oil. Canola oil. Sunflower oil. Grapeseed oil. Avocado. Walnuts. Sweets and desserts Sugar-free or low-fat pudding. Sugar-free or low-fat ice cream and other frozen treats. Seasonings and condiments Herbs. Sodium-free spices. Mustard. Relish. Low-salt, low-sugar ketchup. Low-salt, low-sugar barbecue sauce. Low-fat or fat-free mayonnaise. The items listed above may not be a complete list of recommended foods and beverages. Contact a dietitian for more information. What foods are not recommended? Fruits Fruits canned with syrup. Vegetables Canned  vegetables. Frozen vegetables with butter or cream sauce. Grains Refined white flour and flour products, such as bread, pasta, snack foods, and cereals. Meats and other proteins Fatty cuts of meat. Poultry with skin. Breaded or fried meat. Processed meats. Dairy Full-fat yogurt, cheese, or milk. Beverages Sweetened drinks, such as iced tea and soda. Fats and oils Butter. Lard. Ghee. Sweets and desserts Baked goods, such as cake, cupcakes, pastries, cookies, and cheesecake. Seasonings and condiments Spice mixes with added salt. Ketchup. Barbecue sauce. Mayonnaise. The items listed above may not be a complete list of foods and beverages that are not recommended. Contact a dietitian for more information. Where to find more information American Diabetes Association: www.diabetes.org Summary You may need to make diet and lifestyle changes to help prevent the onset of diabetes. These changes can help you control blood sugar, improve cholesterol levels, and manage blood pressure. Set weight loss goals with help from your health care team. It is recommended that most people with prediabetes lose 7% of their body weight. Consider following a Mediterranean diet. This includes eating plenty of fresh fruits and vegetables, whole grains, beans, nuts, seeds, fish, and low-fat dairy, and using olive oil instead of other fats. This information is not intended to replace advice given to you by your health care provider. Make sure you discuss any questions you have with your health care provider. Document Revised: 12/02/2019 Document Reviewed: 12/02/2019 Elsevier Patient Education  2022 [employer].

## 2021-07-16 NOTE — Assessment & Plan Note (Signed)
Noted on October 2022 labs -- plan to recheck annually.  Recommend heavy focus on diet and exercise at home.

## 2021-07-16 NOTE — Assessment & Plan Note (Signed)
Improved at this time with a few old scars -- darker skin areas - noted to shoulders and chest. Appears Retin-A note covered by insurance, but may be beneficial for these areas -- consider referral to dermatology in future.

## 2021-07-16 NOTE — Assessment & Plan Note (Signed)
To upper left eyelid - will treat with erythromycin ointment, educated her on this.  Recommend she obtain yearly eye exams.  Return to office if worsening or ongoing.

## 2021-07-16 NOTE — Progress Notes (Signed)
New Patient Office Visit  Subjective:  Patient ID: Cheryl Wong, female    DOB: 1995/11/06  Age: 25 y.o. MRN: [id number]  CC:  Chief Complaint  Patient presents with   Establish Care    No concerns per patient    HPI [name] presents for new patient visit to establish care.  Introduced to Publishing rights manager role and practice setting.  All questions answered.  Discussed provider/patient relationship and expectations. Has not had a primary care provider in awhile.    Has history of gestational diabetes -- has a 27 year-old and 56 month-old.  There is family history of diabetes.  Last lab work in October 2022 showed A1c 5.2% + noted LDL 131.  History of acne she attended dermatology, but reports creams she used did not work very well.    Had recent ear infection to left ear and now has firm area to left eye would like looked at.  ECZEMA Was seeing dermatology, last saw in 2020 -- used Triamcinolone, but this did not help long term.  Mother-in-law has eczema. Duration: months Location: hands, shoulders, back Redness: no Swelling: no Oozing: no Pus: no Fevers: no Nausea/vomiting: no Status: stable Treatments attempted: Triamcinolone  Past Medical History:  Diagnosis Date   Acute cholecystitis 10/04/2016   Asthma    Biliary colic    Dyshidrotic eczema    Gestational diabetes    Psoriasis    RUQ pain     Past Surgical History:  Procedure Laterality Date   CESAREAN SECTION N/A 12/08/2020   Procedure: CESAREAN SECTION;  Surgeon: Hildred Laser, MD;  Location: [facility] ORS;  Service: Obstetrics;  Laterality: N/A;   NO PAST SURGERIES      Family History  Problem Relation Age of Onset   Heart disease Mother    Diabetes Mother    Hypertension Father    Diabetes Father    Diabetes Brother    Diabetes Paternal Grandmother    Diabetes Paternal Grandfather     Social History   Socioeconomic History   Marital status: Married    Spouse name: Not on file   Number of  children: 2   Years of education: Not on file   Highest education level: Not on file  Occupational History   Not on file  Tobacco Use   Smoking status: Never   Smokeless tobacco: Never  Vaping Use   Vaping Use: Never used  Substance and Sexual Activity   Alcohol use: No   Drug use: No   Sexual activity: Yes    Birth control/protection: Pill  Other Topics Concern   Not on file  Social History Narrative   Not on file   Social Determinants of Health   Financial Resource Strain: Low Risk    Difficulty of Paying Living Expenses: Not hard at all  Food Insecurity: No Food Insecurity   Worried About Programme researcher, broadcasting/film/video in the Last Year: Never true   Ran Out of Food in the Last Year: Never true  Transportation Needs: No Transportation Needs   Lack of Transportation (Medical): No   Lack of Transportation (Non-Medical): No  Physical Activity: Insufficiently Active   Days of Exercise per Week: 2 days   Minutes of Exercise per Session: 30 min  Stress: No Stress Concern Present   Feeling of Stress : Only a little  Social Connections: Moderately Integrated   Frequency of Communication with Friends and Family: Three times a week   Frequency of Social Gatherings  with Friends and Family: Three times a week   Attends Religious Services: More than 4 times per year   Active Member of Clubs or Organizations: No   Attends Banker Meetings: Never   Marital Status: Married  Catering manager Violence: Not At Risk   Fear of Current or Ex-Partner: No   Emotionally Abused: No   Physically Abused: No   Sexually Abused: No    ROS Review of Systems  Constitutional:  Negative for activity change, appetite change, diaphoresis, fatigue and fever.  Respiratory:  Negative for cough, chest tightness, shortness of breath and wheezing.   Cardiovascular:  Negative for chest pain, palpitations and leg swelling.  Gastrointestinal: Negative.   Endocrine: Negative.   Skin:  Positive for rash.   Neurological: Negative.   Psychiatric/Behavioral: Negative.     Objective:   Today's Vitals: BP 105/67   Pulse 85   Temp 97.6 F (36.4 C) (Oral)   Ht 5\' 2"  (1.575 m)   Wt 155 lb (70.3 kg)   LMP 06/30/2021 (Approximate)   SpO2 98%   BMI 28.35 kg/m   Physical Exam Vitals and nursing note reviewed.  Constitutional:      General: She is awake. She is not in acute distress.    Appearance: She is well-developed and well-groomed. She is not ill-appearing or toxic-appearing.  HENT:     Head: Normocephalic.     Right Ear: Hearing normal.     Left Ear: Hearing normal.  Eyes:     General: Lids are normal. Lids are everted, no foreign bodies appreciated.        Right eye: No discharge.        Left eye: Hordeolum present.No discharge.     Conjunctiva/sclera: Conjunctivae normal.     Pupils: Pupils are equal, round, and reactive to light.     Visual Fields: Right eye visual fields normal and left eye visual fields normal.  Neck:     Thyroid: No thyromegaly.     Vascular: No carotid bruit.  Cardiovascular:     Rate and Rhythm: Normal rate and regular rhythm.     Heart sounds: Normal heart sounds. No murmur heard.   No gallop.  Pulmonary:     Effort: Pulmonary effort is normal. No accessory muscle usage or respiratory distress.     Breath sounds: Normal breath sounds.  Abdominal:     General: Bowel sounds are normal.     Palpations: Abdomen is soft. There is no hepatomegaly or splenomegaly.  Musculoskeletal:     Cervical back: Normal range of motion and neck supple.     Right lower leg: No edema.     Left lower leg: No edema.  Lymphadenopathy:     Cervical: No cervical adenopathy.  Skin:    General: Skin is warm and dry.     Findings: Rash present. Rash is scaling.     Comments: To inner fingers scattered areas of scaly rash with erythema to base.  Skin intact.  Neurological:     Mental Status: She is alert and oriented to person, place, and time.  Psychiatric:         Attention and Perception: Attention normal.        Mood and Affect: Mood normal.        Speech: Speech normal.        Behavior: Behavior normal. Behavior is cooperative.        Thought Content: Thought content normal.    Assessment & Plan:  Problem List Items Addressed This Visit       Musculoskeletal and Integument   Acne    Improved at this time with a few old scars -- darker skin areas - noted to shoulders and chest. Appears Retin-A note covered by insurance, but may be beneficial for these areas -- consider referral to dermatology in future.      Dyshidrotic eczema    To fingers -- at this time start Clobetasol to apply to areas, educated her on this and not to get in eye area.  Apply to inner fingers only.  If worsening or ongoing then will refer to dermatology.        Other   History of gestational diabetes    Recent A1c in October 2022 was 5.2% -- will continue to monitor yearly.      Hordeolum of left eye    To upper left eyelid - will treat with erythromycin ointment, educated her on this.  Recommend she obtain yearly eye exams.  Return to office if worsening or ongoing.      Elevated low density lipoprotein (LDL) cholesterol level    Noted on October 2022 labs -- plan to recheck annually.  Recommend heavy focus on diet and exercise at home.      Other Visit Diagnoses     Encounter to establish care    -  Primary       Outpatient Encounter Medications as of 07/16/2021  Medication Sig   clobetasol ointment (TEMOVATE) 0.05 % Apply 1 application topically 2 (two) times daily.   erythromycin ophthalmic ointment Place 1 application into the left eye at bedtime for 7 days.   Norgestimate-Ethinyl Estradiol Triphasic (TRI-SPRINTEC) 0.18/0.215/0.25 MG-35 MCG tablet Take 1 tablet by mouth daily.   [DISCONTINUED] metroNIDAZOLE (FLAGYL) 500 MG tablet Take 1 tablet (500 mg total) by mouth 2 (two) times daily.   No facility-administered encounter medications on file as  of 07/16/2021.    Follow-up: Return in about 1 year (around 07/16/2022) for Annual physical.   Marjie Skiff, NP

## 2021-07-16 NOTE — Assessment & Plan Note (Signed)
Recent A1c in October 2022 was 5.2% -- will continue to monitor yearly. 

## 2021-07-16 NOTE — Assessment & Plan Note (Signed)
To fingers -- at this time start Clobetasol to apply to areas, educated her on this and not to get in eye area.  Apply to inner fingers only.  If worsening or ongoing then will refer to dermatology.

## 2021-09-19 NOTE — Progress Notes (Signed)
Acute Office Visit  Subjective:    Patient ID: Cheryl Wong, female    DOB: [birth year], 26 y.o.   MRN: [id number]  Chief Complaint  Patient presents with   Eye Pain   Ear Pain    Patient states she thinks she has an ear infection. Patient states the pain radiates from the L ear to her eye it starting to interfere with her vision. Patient states she has had a ear infection before and was given antibiotics and it help. Patient states she would like to discuss a referral for an eye doctor to have an eye exam. Patient states she can't see with this current infection.    Medication Refill    Patient is requesting a refill on cream prescription.     HPI Patient is in today for ear pain. She has also been having trouble with her vision and eye pain that comes and goes, but is doing better. She was given an ointment that helped with the pain at her last visit.   EAR PAIN  Duration:4 days  Involved ear(s): left Severity:  moderate  Quality:  "pain" Fever: no Otorrhea: no Upper respiratory infection symptoms: no Pruritus: yes Hearing loss: no Water immersion no Using Q-tips: yes Recurrent otitis media: no Status: worse Treatments attempted: ibuprofen  Past Medical History:  Diagnosis Date   Acute cholecystitis 10/04/2016   Asthma    Biliary colic    Dyshidrotic eczema    Gestational diabetes    Psoriasis    RUQ pain     Past Surgical History:  Procedure Laterality Date   CESAREAN SECTION N/A 12/08/2020   Procedure: CESAREAN SECTION;  Surgeon: Rubie Maid, MD;  Location: [facility] ORS;  Service: Obstetrics;  Laterality: N/A;   NO PAST SURGERIES      Family History  Problem Relation Age of Onset   Heart disease Mother    Diabetes Mother    Hypertension Father    Diabetes Father    Diabetes Brother    Diabetes Paternal Grandmother    Diabetes Paternal Grandfather     Social History   Socioeconomic History   Marital status: Married    Spouse name: Not on file    Number of children: 2   Years of education: Not on file   Highest education level: Not on file  Occupational History   Not on file  Tobacco Use   Smoking status: Never   Smokeless tobacco: Never  Vaping Use   Vaping Use: Never used  Substance and Sexual Activity   Alcohol use: No   Drug use: No   Sexual activity: Yes    Birth control/protection: Pill  Other Topics Concern   Not on file  Social History Narrative   Not on file   Social Determinants of Health   Financial Resource Strain: Low Risk    Difficulty of Paying Living Expenses: Not hard at all  Food Insecurity: No Food Insecurity   Worried About Charity fundraiser in the Last Year: Never true   [place] in the Last Year: Never true  Transportation Needs: No Transportation Needs   Lack of Transportation (Medical): No   Lack of Transportation (Non-Medical): No  Physical Activity: Insufficiently Active   Days of Exercise per Week: 2 days   Minutes of Exercise per Session: 30 min  Stress: No Stress Concern Present   Feeling of Stress : Only a little  Social Connections: Moderately Integrated   Frequency of Communication  with Friends and Family: Three times a week   Frequency of Social Gatherings with Friends and Family: Three times a week   Attends Religious Services: More than 4 times per year   Active Member of Clubs or Organizations: No   Attends Archivist Meetings: Never   Marital Status: Married  Human resources officer Violence: Not At Risk   Fear of Current or Ex-Partner: No   Emotionally Abused: No   Physically Abused: No   Sexually Abused: No    Outpatient Medications Prior to Visit  Medication Sig Dispense Refill   clobetasol ointment (TEMOVATE) 1.24 % Apply 1 application topically 2 (two) times daily. 30 g 2   Norgestimate-Ethinyl Estradiol Triphasic (TRI-SPRINTEC) 0.18/0.215/0.25 MG-35 MCG tablet Take 1 tablet by mouth daily. 84 tablet 4   No facility-administered medications prior to  visit.    No Known Allergies  Review of Systems  Constitutional: Negative.   HENT:  Positive for ear pain. Negative for congestion, rhinorrhea and sore throat.   Eyes:  Positive for visual disturbance.  Respiratory: Negative.    Cardiovascular: Negative.   Gastrointestinal:  Positive for abdominal pain (reflux). Negative for nausea and vomiting.  Genitourinary: Negative.   Skin: Negative.   Neurological:  Positive for headaches. Negative for dizziness.      Objective:    Physical Exam Vitals and nursing note reviewed.  Constitutional:      General: She is not in acute distress.    Appearance: Normal appearance.  HENT:     Head: Normocephalic.     Right Ear: Tympanic membrane, ear canal and external ear normal.     Left Ear: Tympanic membrane normal. Drainage, swelling and tenderness present. Tympanic membrane is not erythematous.  Eyes:     General:        Left eye: Hordeolum present.    Conjunctiva/sclera: Conjunctivae normal.  Cardiovascular:     Rate and Rhythm: Normal rate and regular rhythm.     Pulses: Normal pulses.     Heart sounds: Normal heart sounds.  Pulmonary:     Effort: Pulmonary effort is normal.     Breath sounds: Normal breath sounds.  Musculoskeletal:     Cervical back: Normal range of motion.  Skin:    General: Skin is warm.  Neurological:     General: No focal deficit present.     Mental Status: She is alert and oriented to person, place, and time.  Psychiatric:        Mood and Affect: Mood normal.        Behavior: Behavior normal.        Thought Content: Thought content normal.        Judgment: Judgment normal.    BP 103/68    Pulse 87    Temp 98.4 F (36.9 C) (Oral)    Ht 5\' 2"  (1.575 m)    Wt 154 lb 3.2 oz (69.9 kg)    SpO2 99%    BMI 28.20 kg/m  Wt Readings from Last 3 Encounters:  09/20/21 154 lb 3.2 oz (69.9 kg)  07/16/21 155 lb (70.3 kg)  06/27/21 157 lb 1.6 oz (71.3 kg)   Vision Screening   Right eye Left eye Both eyes   Without correction 20/30 20/40 20/25   With correction        Health Maintenance Due  Topic Date Due   URINE MICROALBUMIN  Never done    There are no preventive care reminders to display for this patient.  Lab Results  Component Value Date   TSH 1.460 06/27/2021   Lab Results  Component Value Date   WBC 7.2 06/27/2021   HGB 13.4 06/27/2021   HCT 38.8 06/27/2021   MCV 85 06/27/2021   PLT 280 06/27/2021   Lab Results  Component Value Date   NA 139 06/27/2021   K 4.9 06/27/2021   CO2 21 06/27/2021   GLUCOSE 72 06/27/2021   BUN 8 06/27/2021   CREATININE 0.47 (L) 06/27/2021   BILITOT <0.2 06/27/2021   ALKPHOS 66 06/27/2021   AST 19 06/27/2021   ALT 22 06/27/2021   PROT 7.2 06/27/2021   ALBUMIN 4.2 06/27/2021   CALCIUM 9.4 06/27/2021   ANIONGAP 11 08/02/2020   EGFR 135 06/27/2021   Lab Results  Component Value Date   CHOL 223 (H) 06/27/2021   Lab Results  Component Value Date   HDL 52 06/27/2021   Lab Results  Component Value Date   LDLCALC 131 (H) 06/27/2021   Lab Results  Component Value Date   TRIG 227 (H) 06/27/2021   Lab Results  Component Value Date   CHOLHDL 4.3 06/27/2021   Lab Results  Component Value Date   HGBA1C 5.2 06/27/2021       Assessment & Plan:   Problem List Items Addressed This Visit       Digestive   Gastroesophageal reflux disease    She has tried zantac OTC which hasn't fully helped her symptoms. Will start prilosec daily. Information given on eating small meals and limiting spicy or triggering food.       Relevant Medications   omeprazole (PRILOSEC) 20 MG capsule     Other   Hordeolum of left eye   Relevant Orders   Ambulatory referral to Ophthalmology   Other Visit Diagnoses     Acute otitis externa of left ear, unspecified type    -  Primary   Will treat with ciprodex ear drops x10 days. Can take ibuprofen and use warm compresses as needed for pain        Meds ordered this encounter  Medications    ciprofloxacin-dexamethasone (CIPRODEX) OTIC suspension    Sig: Place 4 drops into the left ear 2 (two) times daily for 10 days.    Dispense:  7.5 mL    Refill:  0   erythromycin with ethanol (EMGEL) 2 % gel    Sig: Apply topically 2 (two) times daily.    Dispense:  30 g    Refill:  0   omeprazole (PRILOSEC) 20 MG capsule    Sig: Take 1 capsule (20 mg total) by mouth daily.    Dispense:  90 capsule    Refill:  0     Charyl Dancer, NP

## 2021-09-20 ENCOUNTER — Ambulatory Visit (INDEPENDENT_AMBULATORY_CARE_PROVIDER_SITE_OTHER): Payer: Medicaid Other | Admitting: Nurse Practitioner

## 2021-09-20 ENCOUNTER — Encounter: Payer: Self-pay | Admitting: Nurse Practitioner

## 2021-09-20 ENCOUNTER — Other Ambulatory Visit: Payer: Self-pay

## 2021-09-20 VITALS — BP 103/68 | HR 87 | Temp 98.4°F | Ht 62.0 in | Wt 154.2 lb

## 2021-09-20 DIAGNOSIS — K219 Gastro-esophageal reflux disease without esophagitis: Secondary | ICD-10-CM | POA: Diagnosis not present

## 2021-09-20 DIAGNOSIS — H00014 Hordeolum externum left upper eyelid: Secondary | ICD-10-CM | POA: Diagnosis not present

## 2021-09-20 DIAGNOSIS — H60502 Unspecified acute noninfective otitis externa, left ear: Secondary | ICD-10-CM | POA: Diagnosis not present

## 2021-09-20 MED ORDER — CIPROFLOXACIN-DEXAMETHASONE 0.3-0.1 % OT SUSP: 4.0000 [drp] | Freq: Two times a day (BID) | OTIC | 0 refills | Status: DC

## 2021-09-20 MED ORDER — ERYTHROMYCIN 2 % EX GEL: Freq: Two times a day (BID) | CUTANEOUS | 0 refills | Status: DC

## 2021-09-20 MED ORDER — OMEPRAZOLE 20 MG PO CPDR: 20.0000 mg | DELAYED_RELEASE_CAPSULE | Freq: Every day | ORAL | 0 refills | Status: DC

## 2021-09-20 NOTE — Assessment & Plan Note (Signed)
She has tried zantac OTC which hasn't fully helped her symptoms. Will start prilosec daily. Information given on eating small meals and limiting spicy or triggering food.

## 2021-09-26 ENCOUNTER — Telehealth: Payer: Self-pay | Admitting: Nurse Practitioner

## 2021-09-26 MED ORDER — NEOMYCIN-POLYMYXIN-HC 3.5-10000-1 OT SOLN: 4.0000 [drp] | Freq: Four times a day (QID) | OTIC | 0 refills | Status: DC

## 2021-09-26 NOTE — Telephone Encounter (Signed)
Pt called back and also wanted to know if a different referral for Ophthalmology could be sent in that would be covered by her insurance.

## 2021-09-26 NOTE — Telephone Encounter (Signed)
Copied from CRM [id number]. Topic: General - Other >> Sep 26, 2021 11:27 AM Marylen Ponto wrote: Reason for CRM: Pt stated the Rx for the ear infection costs over $300 and she would like a Rx that is less expensive to be sent to her pharmacy.

## 2021-09-26 NOTE — Telephone Encounter (Signed)
Cheryl Wong, can we send the referral elsewhere please?

## 2021-09-26 NOTE — Telephone Encounter (Signed)
Routing to provider who saw the patient.  

## 2021-12-27 ENCOUNTER — Other Ambulatory Visit: Payer: Self-pay | Admitting: Nurse Practitioner

## 2022-03-22 IMAGING — US US OB < 14 WEEKS - US OB TV
1 series · 14 of 28 positions shown · non-contrast
Comparison: None.

CLINICAL DATA: Unsure of LMP.  Recent oral contraceptive use.

EXAM:
OBSTETRIC <14 WK US AND TRANSVAGINAL OB US
TECHNIQUE: Both transabdominal and transvaginal ultrasound examinations were
performed for complete evaluation of the gestation as well as the
maternal uterus, adnexal regions, and pelvic cul-de-sac.
Transvaginal technique was performed to assess early pregnancy.

[Series 1: us ob less than 14 weeks with ob transvaginal · 14 of 153 slices shown]
[im 6/153]
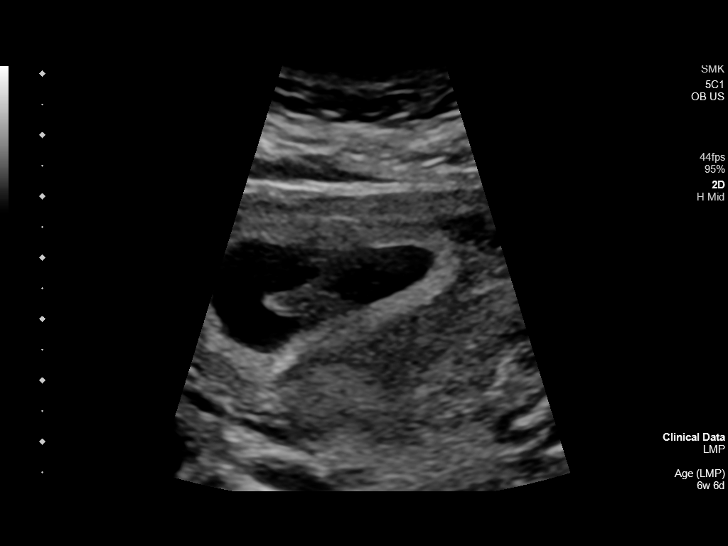
[im 17/153]
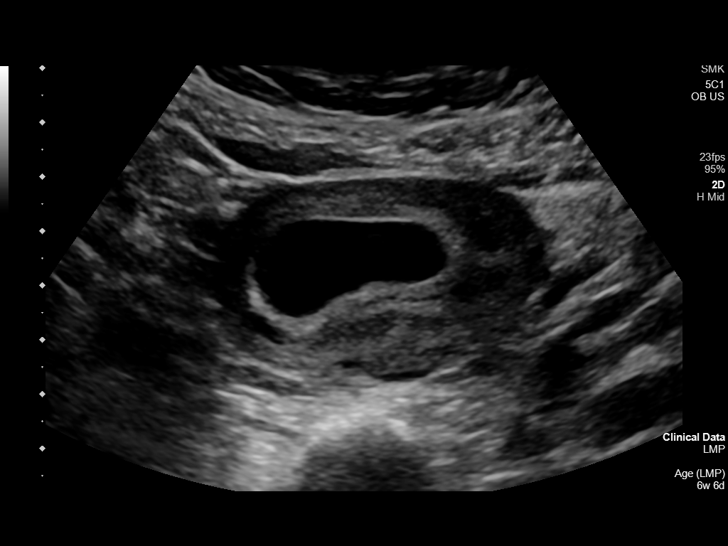
[im 29/153]
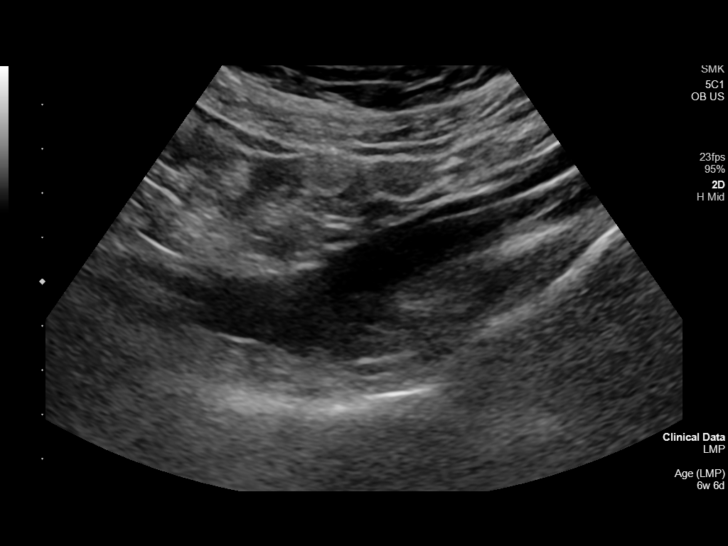
[im 40/153]
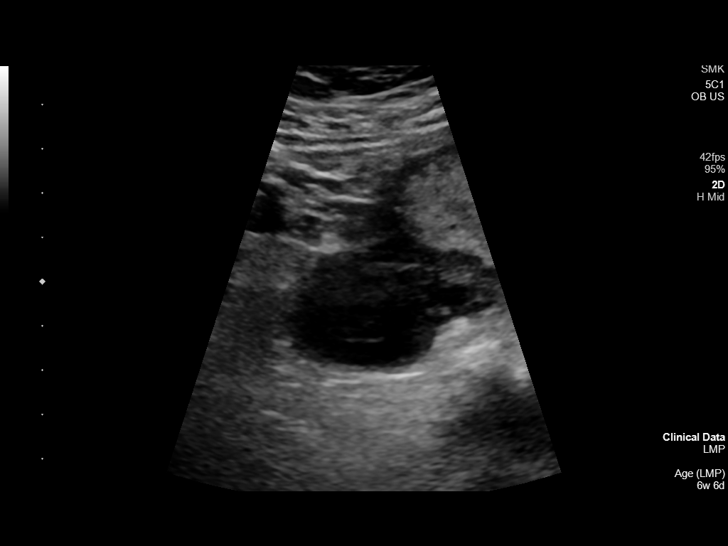
[im 51/153]
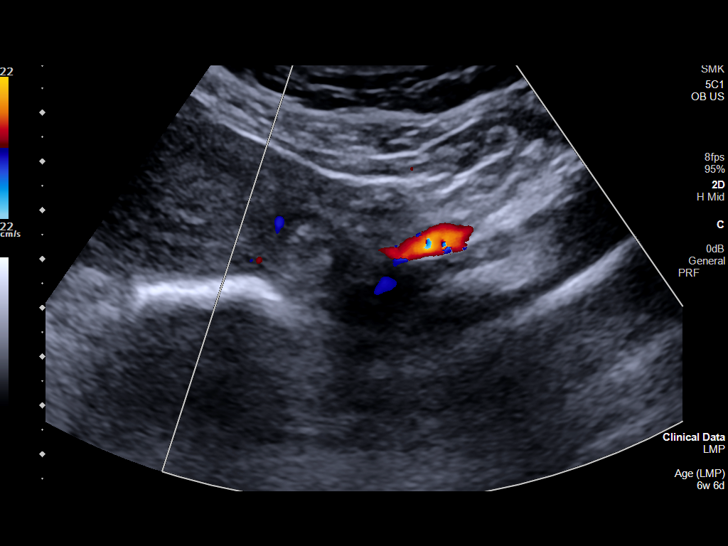
[im 62/153]
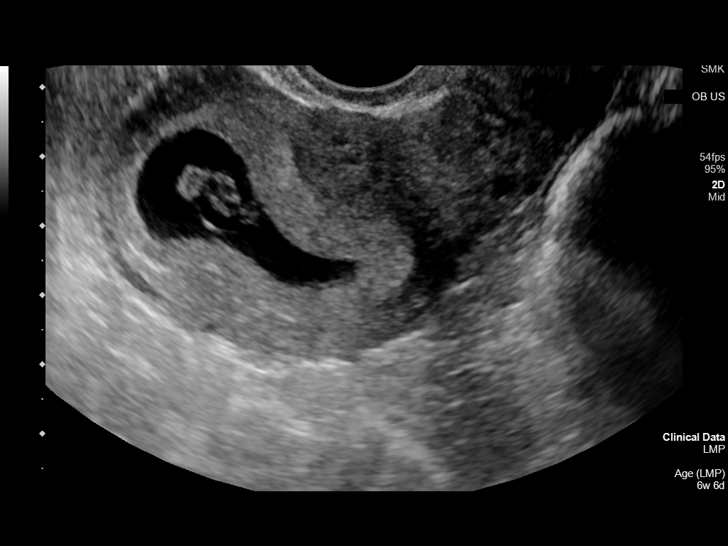
[im 74/153]
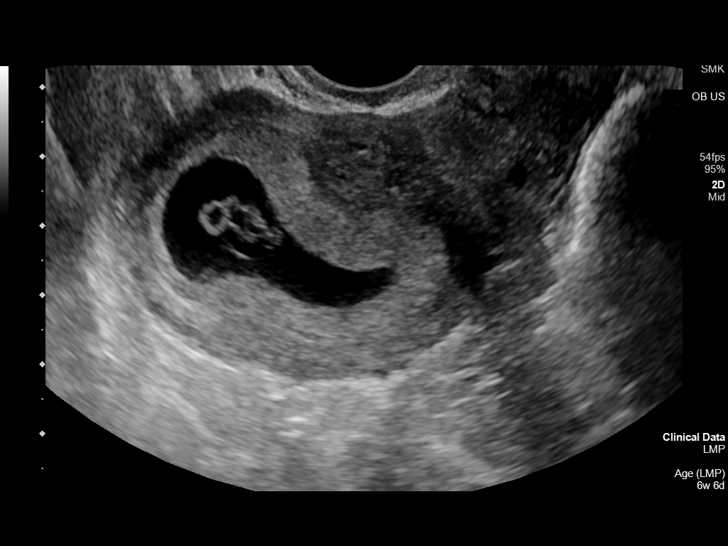
[im 85/153]
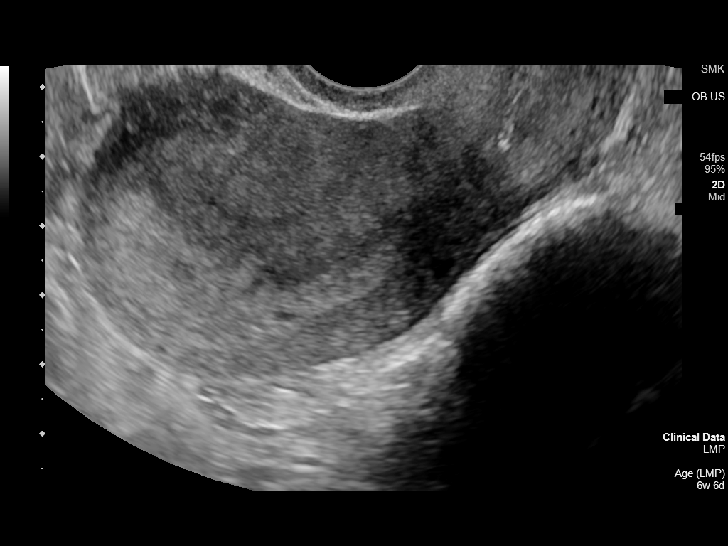
[im 96/153]
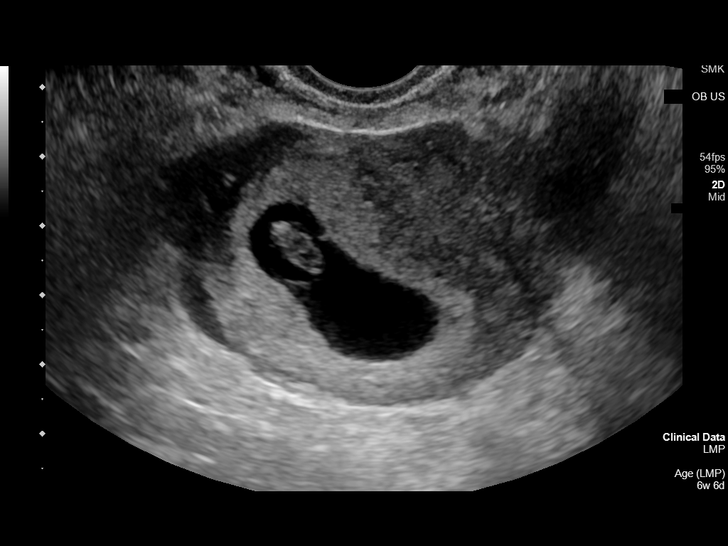
[im 107/153]
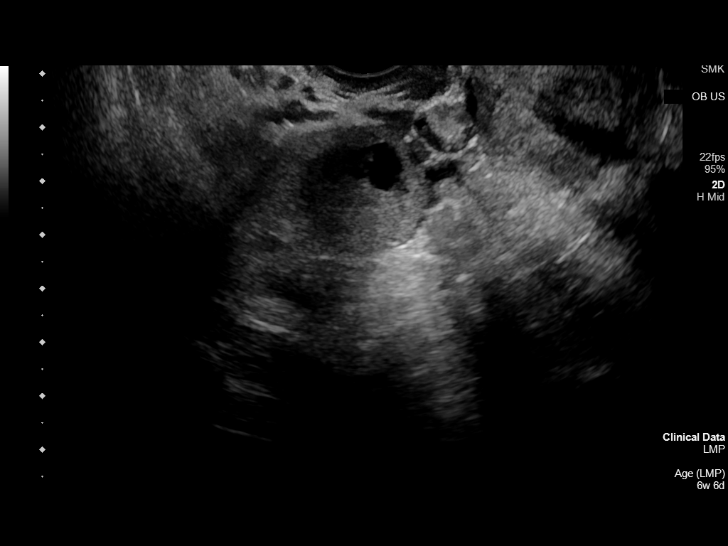
[im 119/153]
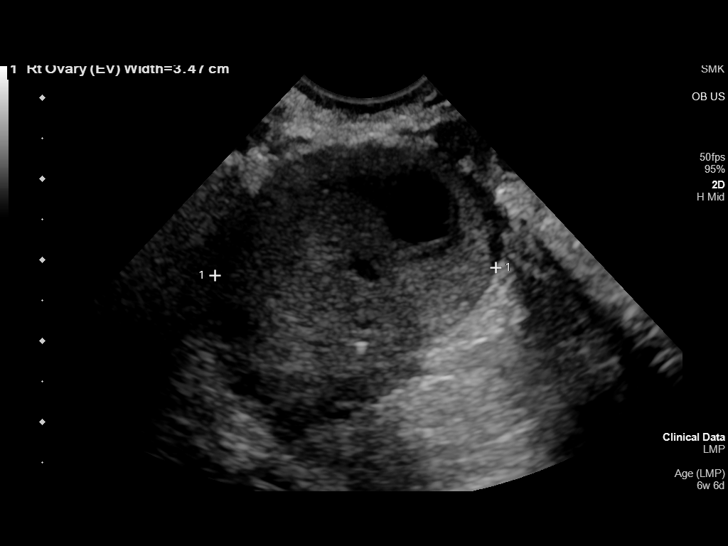
[im 130/153]
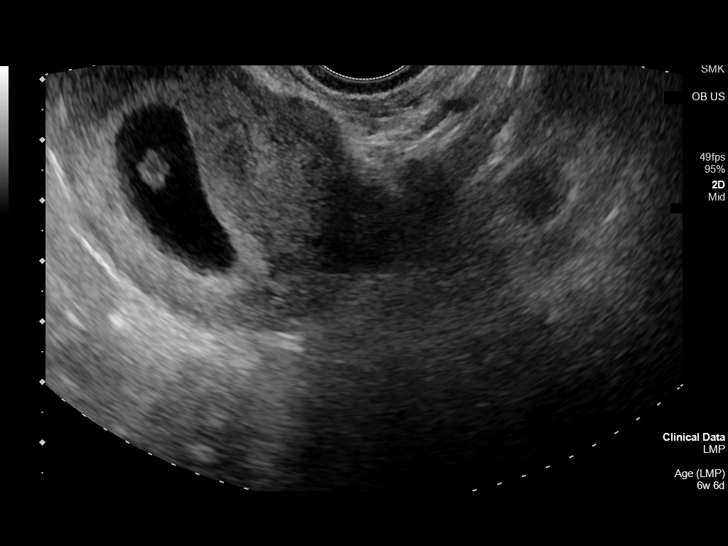
[im 141/153]
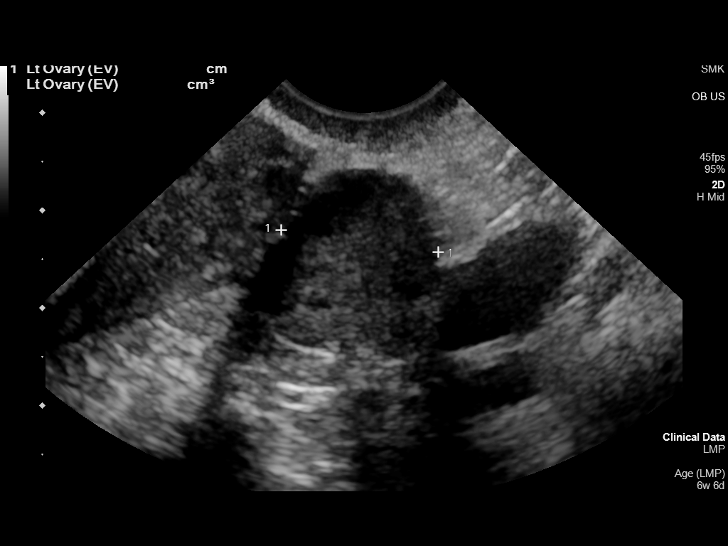
[im 153/153]
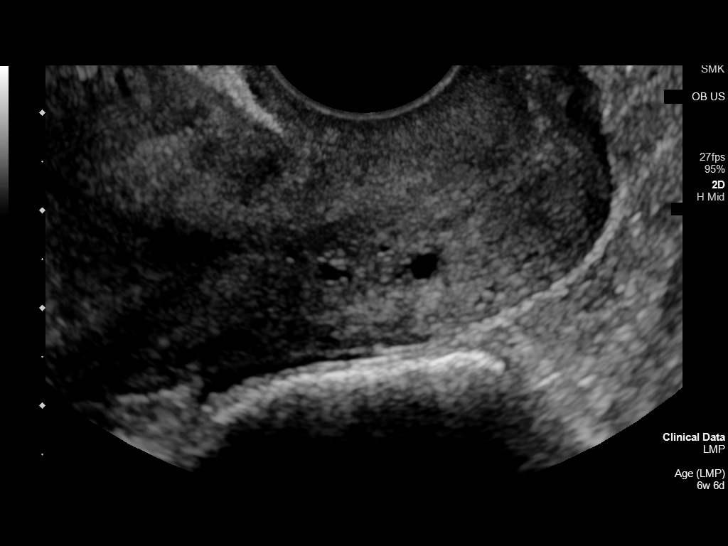

[14 of 28 positions shown; findings below may reference images not displayed]

FINDINGS: Intrauterine gestational sac: Single

Yolk sac:  Visualized.

Embryo:  Visualized.

Cardiac Activity: Visualized.

Heart Rate: 145 bpm

CRL:  12 mm   7 w   3 d                  US EDC: 12/24/2020

Subchorionic hemorrhage:  None visualized.

Maternal uterus/adnexae: Both ovaries are normal in appearance. No
mass or abnormal free fluid identified.
IMPRESSION: Single living IUP with estimated gestational age of 7 weeks 3 days,
and US EDC of 12/24/2020.

## 2022-03-25 ENCOUNTER — Other Ambulatory Visit: Payer: Self-pay | Admitting: Nurse Practitioner

## 2022-03-25 ENCOUNTER — Other Ambulatory Visit: Payer: Self-pay | Admitting: *Deleted

## 2022-03-25 DIAGNOSIS — Z3041 Encounter for surveillance of contraceptive pills: Secondary | ICD-10-CM

## 2022-03-25 MED ORDER — NORGESTIM-ETH ESTRAD TRIPHASIC 0.18/0.215/0.25 MG-35 MCG PO TABS: 1.0000 | ORAL_TABLET | Freq: Every day | ORAL | 4 refills | Status: DC

## 2022-03-25 NOTE — Telephone Encounter (Signed)
Medication Refill - Medication: Norgestimate-Ethinyl Estradiol Triphasic (TRI-SPRINTEC) 0.18/0.215/0.25 MG-35 MCG tablet [[id number]]   Has the patient contacted their pharmacy? Yes.   (Agent: If no, request that the patient contact the pharmacy for the refill. If patient does not wish to contact the pharmacy document the reason why and proceed with request.) (Agent: If yes, when and what did the pharmacy advise?)  Preferred Pharmacy (with phone number or street name):  [id number] [place], [place] - [address] AT [facility] OF [address] [place]  [address] [place] [address]  Phone: [phone] Fax: [phone]  Hours: Not open 24 hours   Has the patient been seen for an appointment in the last year OR does the patient have an upcoming appointment? Yes.    Agent: Please be advised that RX refills may take up to 3 business days. We ask that you follow-up with your pharmacy.

## 2022-03-25 NOTE — Telephone Encounter (Signed)
Pt is asking if RX for Tri Sprintec can be sent today asap / she took her last yesterday / please advise

## 2022-03-25 NOTE — Telephone Encounter (Signed)
Requested medication (s) are due for refill today: {yes per pharmacy  Requested medication (s) are on the active medication list: yes    Last refill: 02/21/21  #84  4 refills  Future visit scheduled yes  Notes to clinic:Historical provider. Called pharmacy, needs refills. Pt has called x 3, out of medication. Please review  Requested Prescriptions  Pending Prescriptions Disp Refills   Norgestimate-Ethinyl Estradiol Triphasic (TRI-SPRINTEC) 0.18/0.215/0.25 MG-35 MCG tablet 84 tablet 4    Sig: Take 1 tablet by mouth daily.     OB/GYN:  Contraceptives Passed - 03/25/2022  4:15 PM      Passed - Last BP in normal range    BP Readings from Last 1 Encounters:  09/20/21 103/68         Passed - Valid encounter within last 12 months    Recent Outpatient Visits           6 months ago Acute otitis externa of left ear, unspecified type   [facility] Mc[place]e, Lauren A, NP   8 months ago Encounter to establish care   [facility] [place], Dorie Rank, NP       Future Appointments             Tomorrow Marjie Skiff, NP [facility]   In 3 months [place], Dorie Rank, NP [facility]            Passed - Patient is not a smoker

## 2022-03-25 NOTE — Telephone Encounter (Signed)
Duplicate request

## 2022-03-26 ENCOUNTER — Ambulatory Visit (INDEPENDENT_AMBULATORY_CARE_PROVIDER_SITE_OTHER): Payer: Medicaid Other | Admitting: Nurse Practitioner

## 2022-03-26 ENCOUNTER — Encounter: Payer: Self-pay | Admitting: Nurse Practitioner

## 2022-03-26 VITALS — BP 95/66 | HR 92 | Temp 98.1°F | Ht 62.0 in | Wt 151.6 lb

## 2022-03-26 DIAGNOSIS — E7800 Pure hypercholesterolemia, unspecified: Secondary | ICD-10-CM | POA: Diagnosis not present

## 2022-03-26 DIAGNOSIS — L301 Dyshidrosis [pompholyx]: Secondary | ICD-10-CM

## 2022-03-26 DIAGNOSIS — Z8632 Personal history of gestational diabetes: Secondary | ICD-10-CM | POA: Diagnosis not present

## 2022-03-26 DIAGNOSIS — R5383 Other fatigue: Secondary | ICD-10-CM

## 2022-03-26 LAB — BAYER DCA HB A1C WAIVED: HB A1C (BAYER DCA - WAIVED): 5.3 % (ref 4.8–5.6)

## 2022-03-26 MED ORDER — CRISABOROLE 2 % EX OINT: TOPICAL_OINTMENT | CUTANEOUS | 5 refills | Status: DC

## 2022-03-26 MED ORDER — OMEPRAZOLE 20 MG PO CPDR: DELAYED_RELEASE_CAPSULE | ORAL | 4 refills | Status: DC

## 2022-03-26 NOTE — Assessment & Plan Note (Signed)
Chronic, ongoing.  Will stop current steroid cream as not offering much benefit, will trial change to Crisaborole -- educated her on this.  Consider dermatology referral if ongoing issues.

## 2022-03-26 NOTE — Assessment & Plan Note (Signed)
Recent A1c in October 2022 was 5.2% -- will continue to monitor yearly.

## 2022-03-26 NOTE — Assessment & Plan Note (Signed)
Noted on past labs -- plan to recheck today.  Recommend heavy focus on diet and exercise at home.

## 2022-03-26 NOTE — Patient Instructions (Signed)
Eczema Eczema refers to a group of skin conditions that cause skin to become rough and inflamed. Each type of eczema has different triggers, symptoms, and treatments. Eczema of any type is usually itchy. Symptoms range from mild to severe. Eczema is not spread from person to person (is not contagious). It can appear on different parts of the body at different times. One person's eczema may look different from another person's eczema. What are the causes? The exact cause of this condition is not known. However, exposure to certain environmental factors, irritants, and allergens can make the condition worse. What are the signs or symptoms? Symptoms of this condition depend on the type of eczema you have. The types include: Contact dermatitis. There are two kinds: Irritant contact dermatitis. This happens when something irritates the skin and causes a rash. Allergic contact dermatitis. This happens when your skin comes in contact with something you are allergic to (allergens). This can include poison ivy, chemicals, or medicines that were applied to your skin. Atopic dermatitis. This is a long-term (chronic) skin disease that keeps coming back (recurring). It is the most common type of eczema. Usual symptoms are a red rash and itchy, dry, scaly skin. It usually starts showing signs in infancy and can last through adulthood. Dyshidrotic eczema. This is a form of eczema on the hands and feet. It shows up as very itchy, fluid-filled blisters. It can affect people of any age but is more common before age 40. Hand eczema. This causes very itchy areas of skin on the palms and sides of the hands and fingers. This type of eczema is common in industrial jobs where you may be exposed to different types of irritants. Lichen simplex chronicus. This type of eczema occurs when a person constantly scratches one area of the body. Repeated scratching of the area leads to thickened skin (lichenification). This condition can  accompany other types of eczema. It is more common in adults but may also be seen in children. Nummular eczema. This is a common type of eczema that most often affects the lower legs and the backs of the hands. It typically causes an itchy, red, circular, crusty lesion (plaque). Scratching may become a habit and can cause bleeding. Nummular eczema occurs most often in middle-aged or older people. Seborrheic dermatitis. This is a common skin disease that mainly affects the scalp. It may also affect other oily areas of the body, such as the face, sides of the nose, eyebrows, ears, eyelids, and chest. It is marked by small scaling and redness of the skin (erythema). This can affect people of all ages. In infants, this condition is called cradle cap. Stasis dermatitis. This is a common skin disease that can cause itching, scaling, and hyperpigmentation, usually on the legs and feet. It occurs most often in people who have a condition that prevents blood from being pumped through the veins in the legs (chronic venous insufficiency). Stasis dermatitis is a chronic condition that needs long-term management. How is this diagnosed? This condition may be diagnosed based on: A physical exam of your skin. Your medical history. Skin patch tests. These tests involve using patches that contain possible allergens and placing them on your back. Your health care provider will check in a few days to see if an allergic reaction occurred. How is this treated? Treatment for eczema is based on the type of eczema you have. You may be given hydrocortisone steroid medicine or antihistamines. These can relieve itching quickly and help reduce inflammation.   These may be prescribed or purchased over the counter, depending on the strength that is needed. Follow these instructions at home: Take or apply over-the-counter and prescription medicines only as told by your health care provider. Use creams or ointments to moisturize your  skin. Do not use lotions. Learn what triggers or irritates your symptoms so you can avoid these things. Treat symptom flare-ups quickly. Do not scratch your skin. This can make your rash worse. Keep all follow-up visits. This is important. Where to find more information American Academy of Dermatology: aad.org National Eczema Association: nationaleczema.org The Society for Pediatric Dermatology: pedsderm.net Contact a health care provider if: You have severe itching, even with treatment. You scratch your skin regularly until it bleeds. Your rash looks different than usual. Your skin is painful, swollen, or more red than usual. You have a fever. Summary Eczema refers to a group of skin conditions that cause skin to become rough and inflamed. Each type has different triggers. Eczema of any type causes itching that may range from mild to severe. Treatment varies based on the type of eczema you have. Hydrocortisone steroid medicine or antihistamines can help with itching and inflammation. Protecting your skin is the best way to prevent eczema. Use creams or ointments to moisturize your skin. Avoid triggers and irritants. Treat flare-ups quickly. This information is not intended to replace advice given to you by your health care provider. Make sure you discuss any questions you have with your health care provider. Document Revised: 06/12/2020 Document Reviewed: 06/12/2020 Elsevier Patient Education  2023 Elsevier Inc.  

## 2022-03-26 NOTE — Progress Notes (Signed)
Contacted via MyChart   Good evening Cheryl Wong, I have good news, your A1c (diabetes testing) is showing no prediabetes or diabetes!!  The remainder of your labs will return tomorrow and I will update you more then.  Any questions? Keep being amazing!!  Thank you for allowing me to participate in your care.  I appreciate you. Kindest regards, Lynsee Wands

## 2022-03-26 NOTE — Progress Notes (Signed)
BP 95/66   Pulse 92   Temp 98.1 F (36.7 C) (Oral)   Ht 5\' 2"  (1.575 m)   Wt 151 lb 9.6 oz (68.8 kg)   SpO2 97%   BMI 27.73 kg/m    Subjective:    Patient ID: Cheryl Wong, female    DOB: 1995/12/12, 26 y.o.   MRN: [id number]  HPI: Cheryl Wong is a 26 y.o. female  Chief Complaint  Patient presents with   Medication Refill    Patient is here for Medication Refill. Patient is requesting a prescription for her Eczema as she is noticing flare ups on her left hand that radiates to her right hand.    Contraception    Patient says she is interested in discussing IUD. Patient says it has been over a year since she since Dr.Cherry with OB-GYN. Patient says she was prescribed birth control when she went back for her six week check up a little over a year ago.    Fatigue    Patient says she is having issues of feeling tired a lot of lately and she is appetite changed as she is feeling hungry fast and notices her hands are shaking.   ECZEMA Would like to try a new cream, as current not working.  Currently is on hands only.  Sometimes this will crack open and bleed. Duration:  chronic  Location: hands  Itching: yes Burning: yes Redness: yes Oozing: no Scaling: yes Blisters: no Painful: no Fevers: no Change in detergents/soaps/personal care products: no Recent illness: no Recent travel:no History of same: yes  BIRTH CONTROL Currently taking birth control, prescribed by Dr. 34.  She would like IUD placed.  She denies any history of DVT/PE, migraine with aura, cancer, blood clotting disorder or tobacco use.  She will discuss IUD with Dr. Valentino Saxon.  FATIGUE She feels very tired lately, would like her A1c checked.  History of gestational diabetes -- took Metformin during that time.  When she is hungry sometimes her hands shake and her body gets fatigued.  Has a 60 year 67 month old son at home. Duration:  months Severity: moderate  Onset: gradual Context when symptoms  started:  unknown Symptoms improve with rest: no  Depressive symptoms: no Stress/anxiety: no Insomnia: no Snoring: no Observed apnea by bed partner: no Daytime hypersomnolence:no Wakes feeling refreshed: no History of sleep study: no Dysnea on exertion:  no Orthopnea/PND: no Chest pain: no Chronic cough: no Lower extremity edema: no Arthralgias:no Myalgias: no Weakness: no Rash: no   Relevant past medical, surgical, family and social history reviewed and updated as indicated. Interim medical history since our last visit reviewed. Allergies and medications reviewed and updated.  Review of Systems  Constitutional:  Positive for fatigue. Negative for activity change, appetite change, diaphoresis and fever.  Respiratory:  Negative for cough, chest tightness and shortness of breath.   Cardiovascular:  Negative for chest pain, palpitations and leg swelling.  Gastrointestinal: Negative.   Endocrine: Negative for polydipsia, polyphagia and polyuria.  Skin:  Positive for rash.  Neurological: Negative.   Psychiatric/Behavioral: Negative.      Per HPI unless specifically indicated above     Objective:    BP 95/66   Pulse 92   Temp 98.1 F (36.7 C) (Oral)   Ht 5\' 2"  (1.575 m)   Wt 151 lb 9.6 oz (68.8 kg)   SpO2 97%   BMI 27.73 kg/m   Wt Readings from Last 3 Encounters:  03/26/22 151  lb 9.6 oz (68.8 kg)  09/20/21 154 lb 3.2 oz (69.9 kg)  07/16/21 155 lb (70.3 kg)    Physical Exam Vitals and nursing note reviewed.  Constitutional:      General: She is awake. She is not in acute distress.    Appearance: She is well-developed and well-groomed. She is not ill-appearing or toxic-appearing.  HENT:     Head: Normocephalic.     Right Ear: Hearing normal.     Left Ear: Hearing normal.  Eyes:     General: Lids are normal.        Right eye: No discharge.        Left eye: No discharge.     Conjunctiva/sclera: Conjunctivae normal.     Pupils: Pupils are equal, round, and  reactive to light.  Neck:     Thyroid: No thyromegaly.     Vascular: No carotid bruit.  Cardiovascular:     Rate and Rhythm: Normal rate and regular rhythm.     Heart sounds: Normal heart sounds. No murmur heard.    No gallop.  Pulmonary:     Effort: Pulmonary effort is normal. No accessory muscle usage or respiratory distress.     Breath sounds: Normal breath sounds.  Abdominal:     General: Bowel sounds are normal.     Palpations: Abdomen is soft.  Musculoskeletal:     Cervical back: Normal range of motion and neck supple.     Right lower leg: No edema.     Left lower leg: No edema.  Skin:    General: Skin is warm and dry.  Neurological:     Mental Status: She is alert and oriented to person, place, and time.  Psychiatric:        Attention and Perception: Attention normal.        Mood and Affect: Mood normal.        Speech: Speech normal.        Behavior: Behavior normal. Behavior is cooperative.        Thought Content: Thought content normal.    Results for orders placed or performed in visit on 03/26/22  Bayer DCA Hb A1c Waived  Result Value Ref Range   HB A1C (BAYER DCA - WAIVED) 5.3 4.8 - 5.6 %      Assessment & Plan:   Problem List Items Addressed This Visit       Musculoskeletal and Integument   Dyshidrotic eczema    Chronic, ongoing.  Will stop current steroid cream as not offering much benefit, will trial change to Crisaborole -- educated her on this.  Consider dermatology referral if ongoing issues.        Other   Elevated low density lipoprotein (LDL) cholesterol level    Noted on past labs -- plan to recheck today.  Recommend heavy focus on diet and exercise at home.      Fatigue    Over past months, will recheck labs today to include CMP, CBC, TSH, VIT D, B12, A1c.  Return in November for follow-up.      Relevant Orders   Bayer DCA Hb A1c Waived (Completed)   Comprehensive metabolic panel   VITAMIN D 25 Hydroxy (Vit-D Deficiency, Fractures)    Vitamin B12   TSH   CBC with Differential/Platelet   History of gestational diabetes - Primary    Recent A1c in October 2022 was 5.2% -- will continue to monitor yearly.        Follow up plan:  Return in about 4 months (around 07/18/2022) for Annual Exam.

## 2022-03-26 NOTE — Assessment & Plan Note (Signed)
Over past months, will recheck labs today to include CMP, CBC, TSH, VIT D, B12, A1c.  Return in November for follow-up.

## 2022-03-27 ENCOUNTER — Other Ambulatory Visit: Payer: Self-pay | Admitting: Nurse Practitioner

## 2022-03-27 LAB — CBC WITH DIFFERENTIAL/PLATELET
Basophils Absolute: 0.1 10*3/uL (ref 0.0–0.2)
Basos: 2 %
EOS (ABSOLUTE): 0.3 10*3/uL (ref 0.0–0.4)
Eos: 7 %
Hematocrit: 40.3 % (ref 34.0–46.6)
Hemoglobin: 13.7 g/dL (ref 11.1–15.9)
Immature Grans (Abs): 0 10*3/uL (ref 0.0–0.1)
Immature Granulocytes: 0 %
Lymphocytes Absolute: 1.9 10*3/uL (ref 0.7–3.1)
Lymphs: 41 %
MCH: 28.3 pg (ref 26.6–33.0)
MCHC: 34 g/dL (ref 31.5–35.7)
MCV: 83 fL (ref 79–97)
Monocytes Absolute: 0.3 10*3/uL (ref 0.1–0.9)
Monocytes: 7 %
Neutrophils Absolute: 2.1 10*3/uL (ref 1.4–7.0)
Neutrophils: 43 %
Platelets: 257 10*3/uL (ref 150–450)
RBC: 4.84 x10E6/uL (ref 3.77–5.28)
RDW: 13.5 % (ref 11.7–15.4)
WBC: 4.8 10*3/uL (ref 3.4–10.8)

## 2022-03-27 LAB — COMPREHENSIVE METABOLIC PANEL
ALT: 30 IU/L (ref 0–32)
AST: 27 IU/L (ref 0–40)
Albumin/Globulin Ratio: 1.3 (ref 1.2–2.2)
Albumin: 4.2 g/dL (ref 4.0–5.0)
Alkaline Phosphatase: 56 IU/L (ref 44–121)
BUN/Creatinine Ratio: 14 (ref 9–23)
BUN: 6 mg/dL (ref 6–20)
Bilirubin Total: <0.2 mg/dL (ref 0.0–1.2)
CO2: 20 mmol/L (ref 20–29)
Calcium: 9.6 mg/dL (ref 8.7–10.2)
Chloride: 104 mmol/L (ref 96–106)
Creatinine, Ser: 0.44 mg/dL — ABNORMAL LOW (ref 0.57–1.00)
Globulin, Total: 3.3 g/dL (ref 1.5–4.5)
Glucose: 85 mg/dL (ref 70–99)
Potassium: 4.3 mmol/L (ref 3.5–5.2)
Sodium: 137 mmol/L (ref 134–144)
Total Protein: 7.5 g/dL (ref 6.0–8.5)
eGFR: 136 mL/min/{1.73_m2} (ref >59)

## 2022-03-27 LAB — VITAMIN B12: Vitamin B-12: 336 pg/mL (ref 232–1245)

## 2022-03-27 LAB — TSH: TSH: 1.35 u[IU]/mL (ref 0.450–4.500)

## 2022-03-27 LAB — VITAMIN D 25 HYDROXY (VIT D DEFICIENCY, FRACTURES): Vit D, 25-Hydroxy: 13.9 ng/mL — ABNORMAL LOW (ref 30.0–100.0)

## 2022-03-27 MED ORDER — CHOLECALCIFEROL 1.25 MG (50000 UT) PO TABS: 1.0000 | ORAL_TABLET | ORAL | 4 refills | Status: DC

## 2022-03-27 NOTE — Progress Notes (Signed)
Contacted via MyChart   Good morning Cheryl Wong, your labs have returned, did you get my message about A1c?  - Vitamin D level is very low, this is important for bone health.  I am going to send in a weekly Vitamin D supplement for you to start taking and we will recheck next visit. - Kidney function, creatinine and eGFR, remains normal, as is liver function, AST and ALT.   - Vitamin B12 is on lower side of normal, I would recommend you take an over the counter Vitamin B12 supplement 1000 MCG daily as this is important for nervous system health and can help with fatigue.  You can find in vitamin section. - Remainder of labs are all stable.  Any questions? Keep being incredible!!  Thank you for allowing me to participate in your care.  I appreciate you. Kindest regards, Alivea Gladson

## 2022-03-30 IMAGING — US US OB COMP LESS 14 WK
1 series · 15 of 28 positions shown · non-contrast
Comparison: Ultrasound 06/10/2020

CLINICAL DATA: Pelvic pain

EXAM:
OBSTETRIC <14 WK ULTRASOUND
TECHNIQUE: Transabdominal ultrasound was performed for evaluation of the
gestation as well as the maternal uterus and adnexal regions.

[Series 1: us ob comp less 14 wk · 15 of 28 slices shown]
[im 1/28]
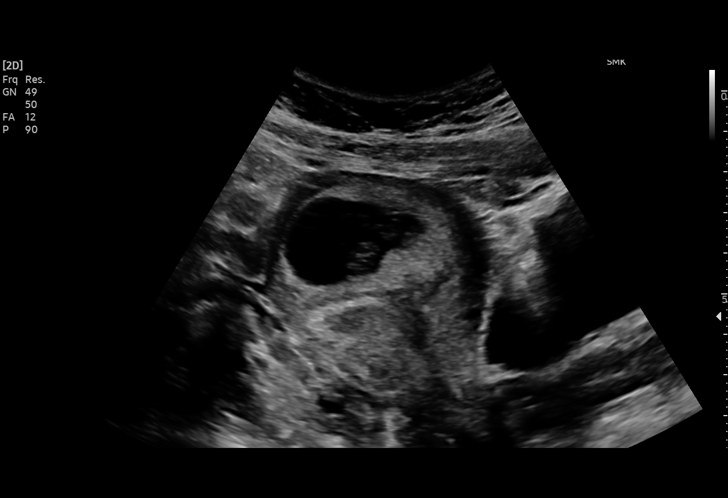
[im 3/28]
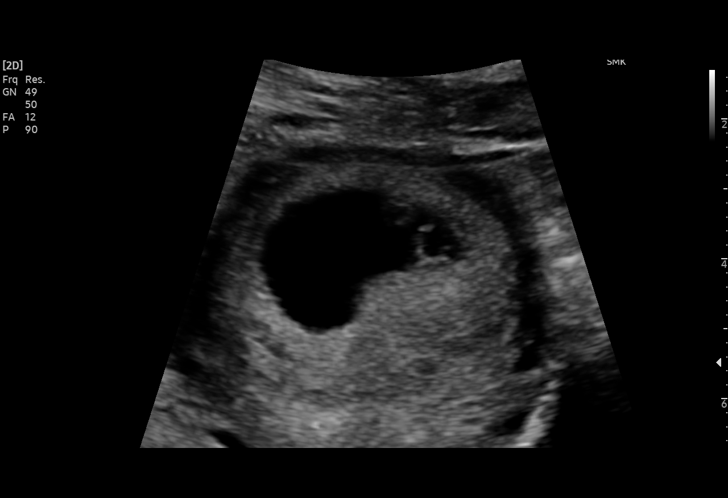
[im 5/28]
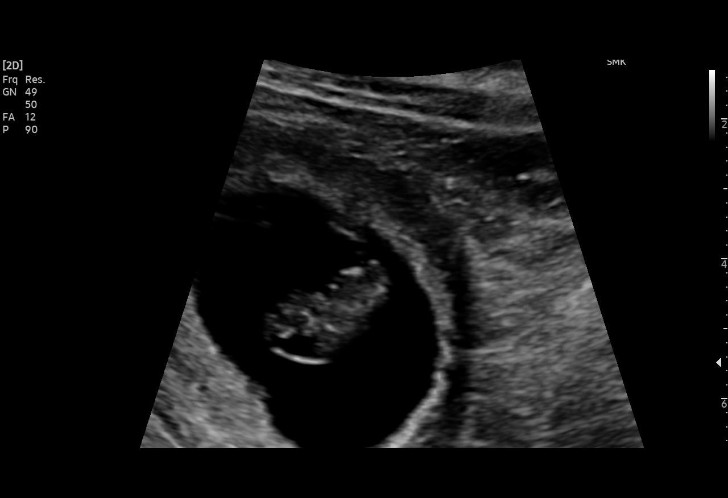
[im 7/28]
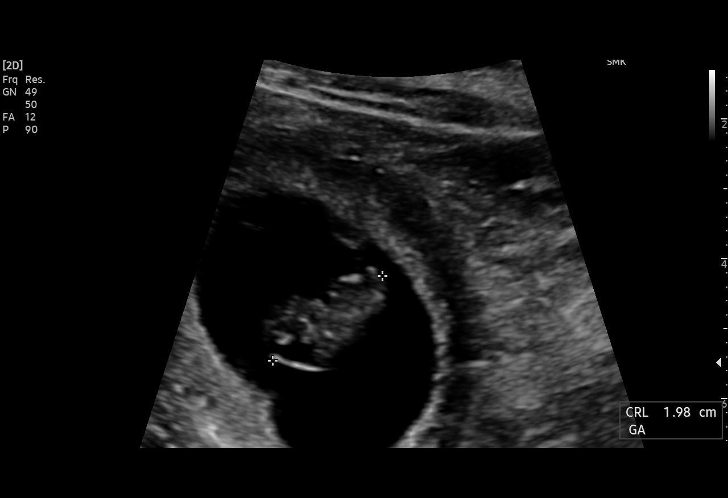
[im 9/28]
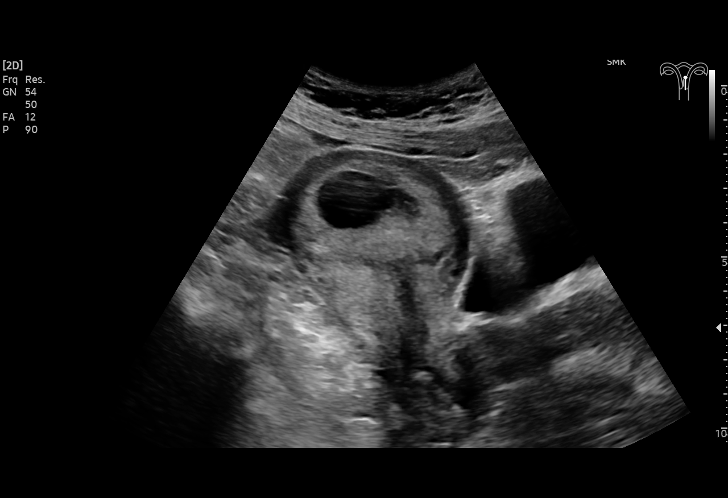
[im 11/28]
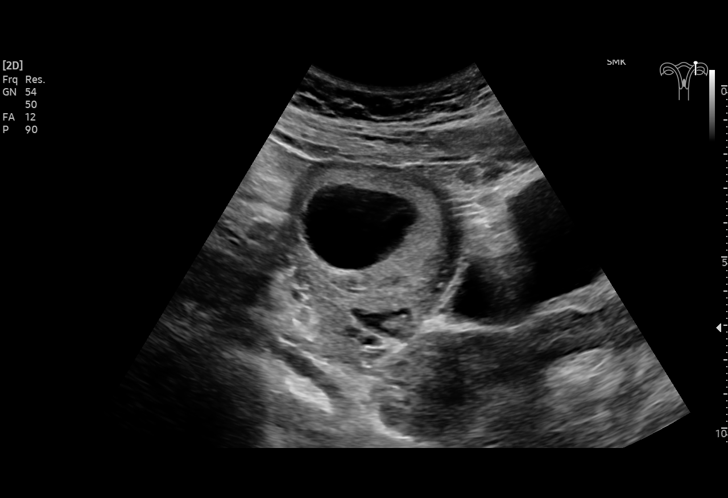
[im 13/28]
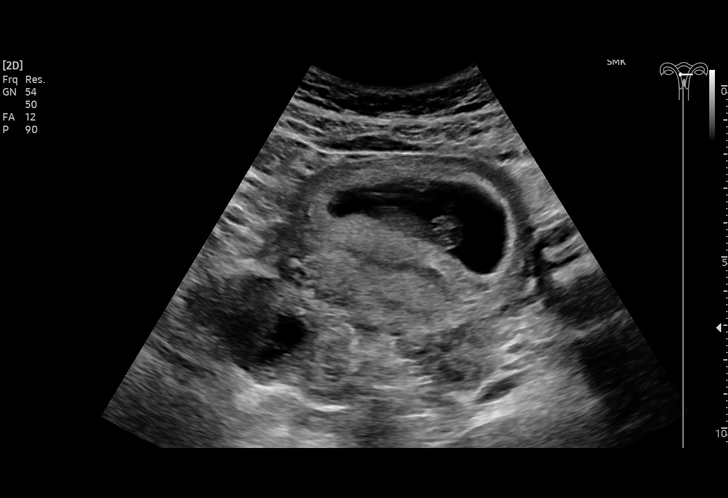
[im 15/28]
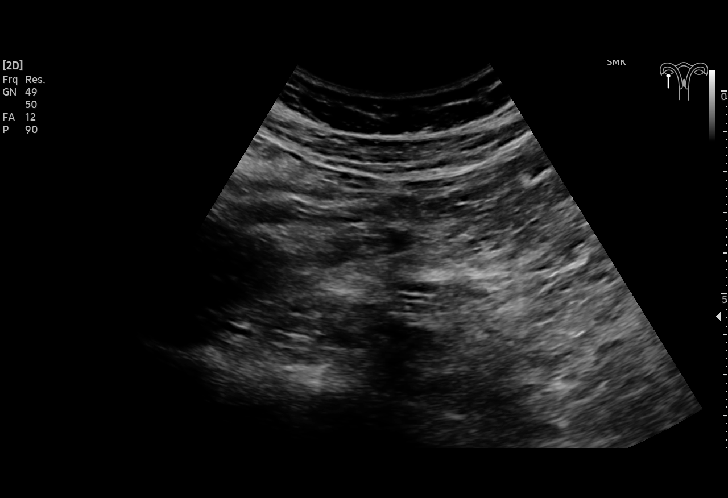
[im 16/28]
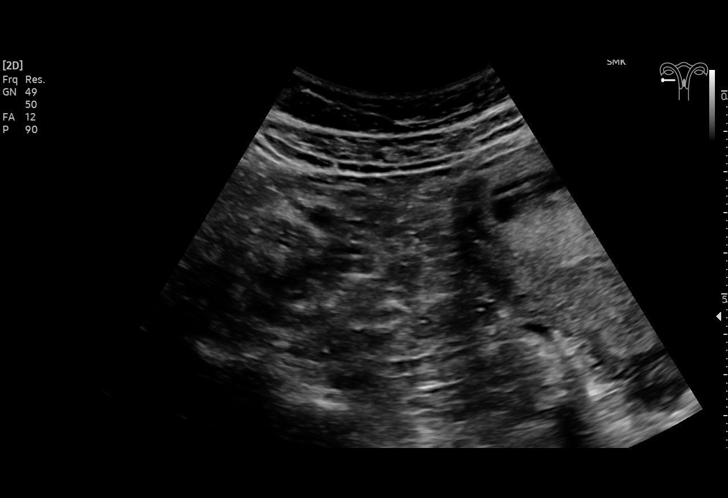
[im 18/28]
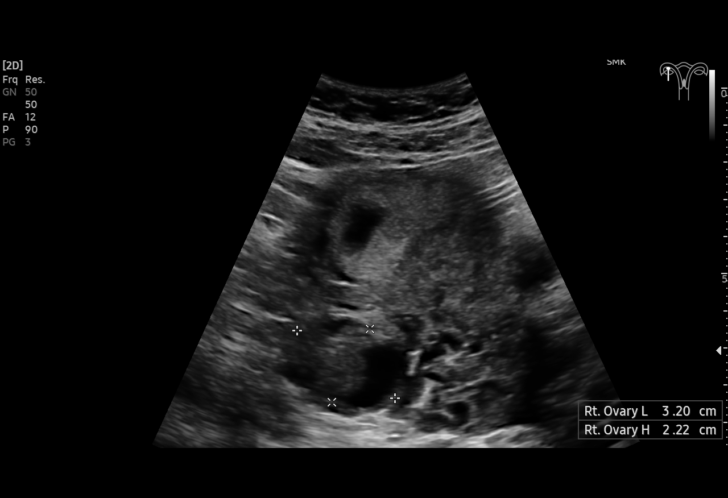
[im 20/28]
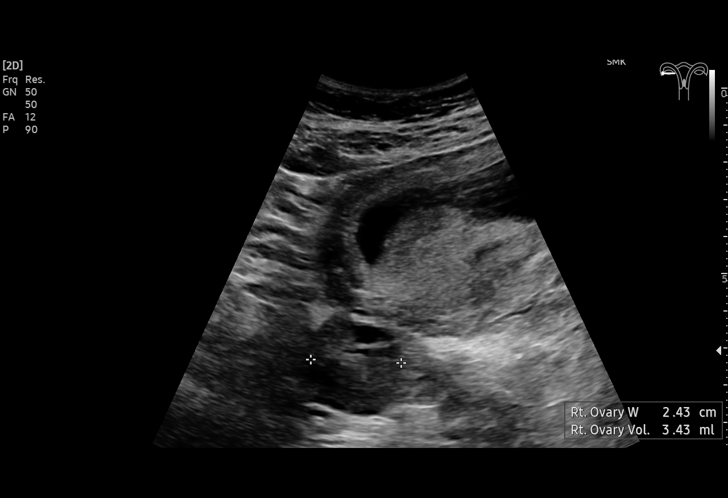
[im 22/28]
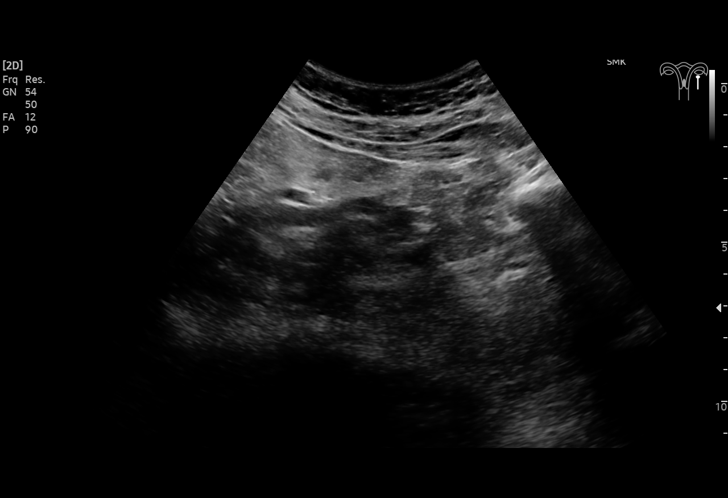
[im 24/28]
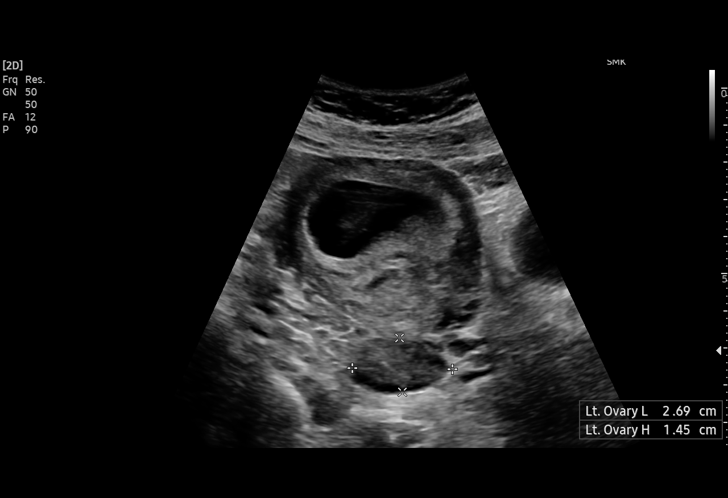
[im 26/28]
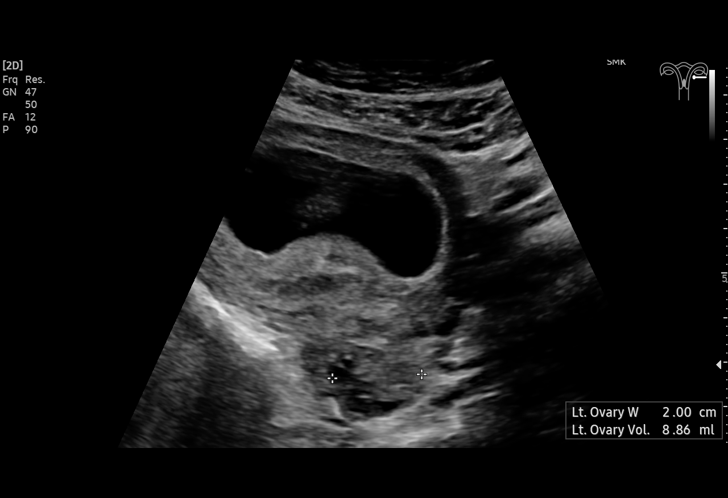
[im 28/28]
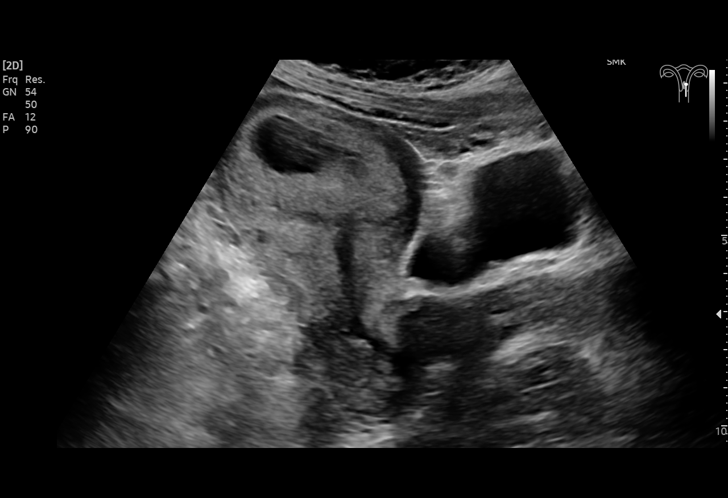

[15 of 28 positions shown; findings below may reference images not displayed]

FINDINGS: LMP: 03/23/2020

GA by LMP: 8 w  0 d

EDC by LMP: 12/28/2020

GA by 1st U/S: 8 w 4 d

EDC by 1st U/S: 12/24/2020

Intrauterine gestational sac: Single

Yolk sac:  Visualized.

Embryo:  Visualized.

Cardiac Activity: Visualized.

Heart Rate: 166 bpm

CRL:   19.8 mm   80 w 4 d                  US EDC: 12/24/2020

Subchorionic hemorrhage:  None visualized.

Maternal uterus/adnexae: Normal, gravid appearance of the anteverted
maternal uterus. No concerning uterine abnormalities. Hypoechoic
corpus luteum noted within the right ovary with peripheral color
Doppler flow. No concerning adnexal abnormalities. No free fluid.
IMPRESSION: Single intrauterine gestation with an estimated gestational age of 8
weeks 4 days by crown-rump length measurement. This is concordant
with both LMP and prior sonographic estimations.

No acute sonographically evident complication.

## 2022-06-27 ENCOUNTER — Encounter: Payer: Self-pay | Admitting: Certified Nurse Midwife

## 2022-07-05 ENCOUNTER — Telehealth: Payer: Self-pay | Admitting: Nurse Practitioner

## 2022-07-05 MED ORDER — CRISABOROLE 2 % EX OINT
TOPICAL_OINTMENT | CUTANEOUS | 5 refills | Status: DC
Start: 2022-07-05 — End: 2022-07-17

## 2022-07-05 NOTE — Telephone Encounter (Signed)
Pt states Crisaborole 2 % OINT is not helping and inquiring if a different Rx can be sent in  Please assist further  Pena Gould, [place] Phone:  [phone]  Fax:  [phone]

## 2022-07-14 DIAGNOSIS — E559 Vitamin D deficiency, unspecified: Secondary | ICD-10-CM | POA: Insufficient documentation

## 2022-07-14 DIAGNOSIS — Z789 Other specified health status: Secondary | ICD-10-CM | POA: Insufficient documentation

## 2022-07-14 NOTE — Patient Instructions (Signed)
Eczema Eczema refers to a group of skin conditions that cause skin to become rough and inflamed. Each type of eczema has different triggers, symptoms, and treatments. Eczema of any type is usually itchy. Symptoms range from mild to severe. Eczema is not spread from person to person (is not contagious). It can appear on different parts of the body at different times. One person's eczema may look different from another person's eczema. What are the causes? The exact cause of this condition is not known. However, exposure to certain environmental factors, irritants, and allergens can make the condition worse. What are the signs or symptoms? Symptoms of this condition depend on the type of eczema you have. The types include: Contact dermatitis. There are two kinds: Irritant contact dermatitis. This happens when something irritates the skin and causes a rash. Allergic contact dermatitis. This happens when your skin comes in contact with something you are allergic to (allergens). This can include poison ivy, chemicals, or medicines that were applied to your skin. Atopic dermatitis. This is a long-term (chronic) skin disease that keeps coming back (recurring). It is the most common type of eczema. Usual symptoms are a red rash and itchy, dry, scaly skin. It usually starts showing signs in infancy and can last through adulthood. Dyshidrotic eczema. This is a form of eczema on the hands and feet. It shows up as very itchy, fluid-filled blisters. It can affect people of any age but is more common before age 40. Hand eczema. This causes very itchy areas of skin on the palms and sides of the hands and fingers. This type of eczema is common in industrial jobs where you may be exposed to different types of irritants. Lichen simplex chronicus. This type of eczema occurs when a person constantly scratches one area of the body. Repeated scratching of the area leads to thickened skin (lichenification). This condition can  accompany other types of eczema. It is more common in adults but may also be seen in children. Nummular eczema. This is a common type of eczema that most often affects the lower legs and the backs of the hands. It typically causes an itchy, red, circular, crusty lesion (plaque). Scratching may become a habit and can cause bleeding. Nummular eczema occurs most often in middle-aged or older people. Seborrheic dermatitis. This is a common skin disease that mainly affects the scalp. It may also affect other oily areas of the body, such as the face, sides of the nose, eyebrows, ears, eyelids, and chest. It is marked by small scaling and redness of the skin (erythema). This can affect people of all ages. In infants, this condition is called cradle cap. Stasis dermatitis. This is a common skin disease that can cause itching, scaling, and hyperpigmentation, usually on the legs and feet. It occurs most often in people who have a condition that prevents blood from being pumped through the veins in the legs (chronic venous insufficiency). Stasis dermatitis is a chronic condition that needs long-term management. How is this diagnosed? This condition may be diagnosed based on: A physical exam of your skin. Your medical history. Skin patch tests. These tests involve using patches that contain possible allergens and placing them on your back. Your health care provider will check in a few days to see if an allergic reaction occurred. How is this treated? Treatment for eczema is based on the type of eczema you have. You may be given hydrocortisone steroid medicine or antihistamines. These can relieve itching quickly and help reduce inflammation.   These may be prescribed or purchased over the counter, depending on the strength that is needed. Follow these instructions at home: Take or apply over-the-counter and prescription medicines only as told by your health care provider. Use creams or ointments to moisturize your  skin. Do not use lotions. Learn what triggers or irritates your symptoms so you can avoid these things. Treat symptom flare-ups quickly. Do not scratch your skin. This can make your rash worse. Keep all follow-up visits. This is important. Where to find more information American Academy of Dermatology: aad.org National Eczema Association: nationaleczema.org The Society for Pediatric Dermatology: pedsderm.net Contact a health care provider if: You have severe itching, even with treatment. You scratch your skin regularly until it bleeds. Your rash looks different than usual. Your skin is painful, swollen, or more red than usual. You have a fever. Summary Eczema refers to a group of skin conditions that cause skin to become rough and inflamed. Each type has different triggers. Eczema of any type causes itching that may range from mild to severe. Treatment varies based on the type of eczema you have. Hydrocortisone steroid medicine or antihistamines can help with itching and inflammation. Protecting your skin is the best way to prevent eczema. Use creams or ointments to moisturize your skin. Avoid triggers and irritants. Treat flare-ups quickly. This information is not intended to replace advice given to you by your health care provider. Make sure you discuss any questions you have with your health care provider. Document Revised: 06/12/2020 Document Reviewed: 06/12/2020 Elsevier Patient Education  2023 Elsevier Inc.  

## 2022-07-17 ENCOUNTER — Encounter: Payer: Self-pay | Admitting: Nurse Practitioner

## 2022-07-17 ENCOUNTER — Ambulatory Visit (INDEPENDENT_AMBULATORY_CARE_PROVIDER_SITE_OTHER): Payer: Medicaid Other | Admitting: Nurse Practitioner

## 2022-07-17 VITALS — BP 93/63 | HR 91 | Temp 97.9°F | Ht 61.0 in | Wt 147.1 lb

## 2022-07-17 DIAGNOSIS — K219 Gastro-esophageal reflux disease without esophagitis: Secondary | ICD-10-CM

## 2022-07-17 DIAGNOSIS — E7800 Pure hypercholesterolemia, unspecified: Secondary | ICD-10-CM

## 2022-07-17 DIAGNOSIS — Z789 Other specified health status: Secondary | ICD-10-CM

## 2022-07-17 DIAGNOSIS — J309 Allergic rhinitis, unspecified: Secondary | ICD-10-CM | POA: Insufficient documentation

## 2022-07-17 DIAGNOSIS — M542 Cervicalgia: Secondary | ICD-10-CM | POA: Insufficient documentation

## 2022-07-17 DIAGNOSIS — E559 Vitamin D deficiency, unspecified: Secondary | ICD-10-CM

## 2022-07-17 DIAGNOSIS — J301 Allergic rhinitis due to pollen: Secondary | ICD-10-CM

## 2022-07-17 DIAGNOSIS — L301 Dyshidrosis [pompholyx]: Secondary | ICD-10-CM | POA: Diagnosis not present

## 2022-07-17 DIAGNOSIS — H60333 Swimmer's ear, bilateral: Secondary | ICD-10-CM | POA: Insufficient documentation

## 2022-07-17 DIAGNOSIS — Z0000 Encounter for general adult medical examination without abnormal findings: Secondary | ICD-10-CM

## 2022-07-17 DIAGNOSIS — Z8632 Personal history of gestational diabetes: Secondary | ICD-10-CM

## 2022-07-17 DIAGNOSIS — Z2821 Immunization not carried out because of patient refusal: Secondary | ICD-10-CM

## 2022-07-17 LAB — BAYER DCA HB A1C WAIVED: HB A1C (BAYER DCA - WAIVED): 5.4 % (ref 4.8–5.6)

## 2022-07-17 LAB — PREGNANCY, URINE: Preg Test, Ur: NEGATIVE

## 2022-07-17 MED ORDER — CIPROFLOXACIN-DEXAMETHASONE 0.3-0.1 % OT SUSP: 4.0000 [drp] | Freq: Two times a day (BID) | OTIC | 0 refills | Status: DC

## 2022-07-17 MED ORDER — LORATADINE 10 MG PO TABS: 10.0000 mg | ORAL_TABLET | Freq: Every day | ORAL | 11 refills | Status: DC

## 2022-07-17 MED ORDER — OMEPRAZOLE 20 MG PO CPDR: DELAYED_RELEASE_CAPSULE | ORAL | 4 refills | Status: DC

## 2022-07-17 MED ORDER — TRIAMCINOLONE ACETONIDE 0.1 % EX CREA: 1.0000 | TOPICAL_CREAM | Freq: Two times a day (BID) | CUTANEOUS | 4 refills | Status: DC

## 2022-07-17 NOTE — Assessment & Plan Note (Addendum)
Recent A1c stable July 5.3 % -- will continue to monitor yearly.

## 2022-07-17 NOTE — Assessment & Plan Note (Signed)
Ongoing, with underlying eczema.  Will trial Claritin 10 MG daily, has taken in past.  Recommend allergy testing in future.

## 2022-07-17 NOTE — Assessment & Plan Note (Signed)
Chronic, ongoing.  No benefit from [place].  Will return to Triamcinolone which offered more benefit, script sent in.  Referral to dermatology for further recommendations.

## 2022-07-17 NOTE — Assessment & Plan Note (Signed)
Noted on past labs -- recommend continued focus on diet and recheck labs today.

## 2022-07-17 NOTE — Assessment & Plan Note (Signed)
Ongoing, tolerating well.  Will check urine pregnancy testing today and CMP. Refills up to date.

## 2022-07-17 NOTE — Assessment & Plan Note (Signed)
Noted on past labs, has not taken supplement consistently.  Recheck level today.

## 2022-07-17 NOTE — Assessment & Plan Note (Signed)
Chronic for 2 years -- muscles very tight on exam to back of neck.  She is a mother and wishes to avoid muscle relaxer due to fatigue with this.  Recommend continue Tylenol and Ibuprofen as needed.  Would look into massage therapy which would offer benefit to tension in neck.  Start using Voltaren gel as needed, massaged into neck.  Alert provider if worsening or ongoing.

## 2022-07-17 NOTE — Assessment & Plan Note (Addendum)
Chronic, ongoing.  She continues on Omeprazole daily with benefit, although reports sometimes has to take two.  Recommend heavy focus on diet and monitoring foods that caused discomfort for her.  Risks of PPI use were discussed with patient including bone loss, C. Diff diarrhea, pneumonia, infections, CKD, electrolyte abnormalities.  Verbalizes understanding and chooses to continue the medication.  Mag level annually.

## 2022-07-17 NOTE — Assessment & Plan Note (Signed)
Acute for several days -- start Ciprodex daily for 7 days and return to office if no improvement or ongoing symptoms.

## 2022-07-17 NOTE — Progress Notes (Signed)
BP 93/63   Pulse 91   Temp 97.9 F (36.6 C) (Oral)   Ht _0  (1.549 m)   Wt 147 lb 1.6 oz (66.7 kg)   SpO2 98%   BMI 27.79 kg/m    Subjective:    Patient ID: Cheryl Wong, female    DOB: October 16, 1995, 26 y.o.   MRN: [id number]  HPI: Cheryl Wong is a 26 y.o. female presenting on 07/17/2022 for comprehensive medical examination. Current medical complaints include: neck pain and eczema + ear pain  She currently lives with: husband and kids Menopausal Symptoms: no  Continues on heart burn medication with benefit -- takes daily.  NECK PAIN Having neck pain coming and going for almost 2 years and now it is somewhat worse  States she gets a knot on neck at times, after working a lot she will notice pain. Treatments attempted: APAP and ibuprofen Relief with NSAIDs?:  moderate Location:midline Duration:chronic Severity: 7/10 Quality: dull and aching Frequency: intermittent Radiation: none Aggravating factors: lifting and movement Alleviating factors: NSAIDs and APAP Weakness:  no Paresthesias / decreased sensation:  no  Fevers:  no   ECZEMA + ALLERGIES No benefit from [place] at this time.  Has tried topical steroids with some benefit.  Would like some medication for allergies, had used before and this helped some.   Duration:  chronic  Location:  feet and hands  Itching: yes Burning: no Redness: yes Oozing: no Scaling: yes Blisters: no Painful: no Fevers: no Change in detergents/soaps/personal care products: no Recent illness: no Recent travel:no History of same: yes Context: fluctuating Alleviating factors: Triamcinolone  Treatments attempted: Triamcinolone and Eucrisa Shortness of breath: no  Throat/tongue swelling: no Myalgias/arthralgias: no   EAR PAIN Present for 5 days, can not wear ear buds.  5 days ago woke-up and felt pain inside ear.  Has ear drops in past for this she reports and that helped. Duration: days Involved ear(s): bilateral Severity:   8/10  Quality:  dull, aching, and throbbing -- worse when wakes up Fever: no Otorrhea: no Upper respiratory infection symptoms: no Pruritus: no Hearing loss: no  Water immersion no Using Q-tips: yes Recurrent otitis media: no Status: fluctuating Treatments attempted: none      07/17/2022    9:12 AM 07/16/2021    8:12 AM 06/27/2021   11:49 AM 01/19/2021    9:59 AM 12/14/2020    9:25 AM  Depression screen PHQ 2/9  Decreased Interest 0 0 0 1 0  Down, Depressed, Hopeless 0 0 0 0 0  PHQ - 2 Score 0 0 0 1 0  Altered sleeping 0 2  0   Tired, decreased energy 0 2  0   Change in appetite 0 0  0   Feeling bad or failure about yourself  0 0  0   Trouble concentrating 0 0  0   Moving slowly or fidgety/restless 0 0  0   Suicidal thoughts 0 0  0   PHQ-9 Score 0 4  1   Difficult doing work/chores Not difficult at all Not difficult at all  Not difficult at all        07/17/2022    9:13 AM 01/19/2021   10:00 AM  GAD 7 : Generalized Anxiety Score  Nervous, Anxious, on Edge 0 0  Control/stop worrying 0 0  Worry too much - different things 0 0  Trouble relaxing 0 0  Restless 0 0  Easily annoyed or irritable 0 0  Afraid - awful might happen 0 0  Total GAD 7 Score 0 0  Anxiety Difficulty Not difficult at all      The patient does not have a history of falls. I did not complete a risk assessment for falls. A plan of care for falls was not documented.   Past Medical History:  Past Medical History:  Diagnosis Date   Acute cholecystitis 10/04/2016   Asthma    Biliary colic    Dyshidrotic eczema    Gestational diabetes    Psoriasis    RUQ pain     Surgical History:  Past Surgical History:  Procedure Laterality Date   CESAREAN SECTION N/A 12/08/2020   Procedure: CESAREAN SECTION;  Surgeon: Rubie Maid, MD;  Location: [facility] ORS;  Service: Obstetrics;  Laterality: N/A;   NO PAST SURGERIES      Medications:  Current Outpatient Medications on File Prior to Visit  Medication Sig    Norgestimate-Ethinyl Estradiol Triphasic (TRI-SPRINTEC) 0.18/0.215/0.25 MG-35 MCG tablet Take 1 tablet by mouth daily.   Cholecalciferol 1.25 MG (50000 UT) TABS Take 1 tablet by mouth once a week. (Patient not taking: Reported on 07/17/2022)   No current facility-administered medications on file prior to visit.    Allergies:  No Known Allergies  Social History:  Social History   Socioeconomic History   Marital status: Married    Spouse name: Not on file   Number of children: 2   Years of education: Not on file   Highest education level: Not on file  Occupational History   Not on file  Tobacco Use   Smoking status: Never   Smokeless tobacco: Never  Vaping Use   Vaping Use: Never used  Substance and Sexual Activity   Alcohol use: No   Drug use: No   Sexual activity: Yes    Birth control/protection: Pill  Other Topics Concern   Not on file  Social History Narrative   Not on file   Social Determinants of Health   Financial Resource Strain: Low Risk  (07/16/2021)   Overall Financial Resource Strain (CARDIA)    Difficulty of Paying Living Expenses: Not hard at all  Food Insecurity: No Food Insecurity (07/16/2021)   Hunger Vital Sign    Worried About Running Out of Food in the Last Year: Never true    [place] in the Last Year: Never true  Transportation Needs: No Transportation Needs (07/16/2021)   PRAPARE - Hydrologist (Medical): No    Lack of Transportation (Non-Medical): No  Physical Activity: Insufficiently Active (07/16/2021)   Exercise Vital Sign    Days of Exercise per Week: 2 days    Minutes of Exercise per Session: 30 min  Stress: No Stress Concern Present (07/16/2021)   [place]    Feeling of Stress : Only a little  Social Connections: Moderately Integrated (07/16/2021)   Social Connection and Isolation Panel [NHANES]    Frequency of Communication with  Friends and Family: Three times a week    Frequency of Social Gatherings with Friends and Family: Three times a week    Attends Religious Services: More than 4 times per year    Active Member of Clubs or Organizations: No    Attends Archivist Meetings: Never    Marital Status: Married  Human resources officer Violence: Not At Risk (07/16/2021)   Humiliation, Afraid, Rape, and Kick questionnaire    Fear of  Current or Ex-Partner: No    Emotionally Abused: No    Physically Abused: No    Sexually Abused: No   Social History   Tobacco Use  Smoking Status Never  Smokeless Tobacco Never   Social History   Substance and Sexual Activity  Alcohol Use No    Family History:  Family History  Problem Relation Age of Onset   Heart disease Mother    Diabetes Mother    Hypertension Father    Diabetes Father    Diabetes Brother    Diabetes Paternal Grandmother    Diabetes Paternal Grandfather     Past medical history, surgical history, medications, allergies, family history and social history reviewed with patient today and changes made to appropriate areas of the chart.   ROS All other ROS negative except what is listed above and in the HPI.      Objective:    BP 93/63   Pulse 91   Temp 97.9 F (36.6 C) (Oral)   Ht _0  (1.549 m)   Wt 147 lb 1.6 oz (66.7 kg)   SpO2 98%   BMI 27.79 kg/m   Wt Readings from Last 3 Encounters:  07/17/22 147 lb 1.6 oz (66.7 kg)  03/26/22 151 lb 9.6 oz (68.8 kg)  09/20/21 154 lb 3.2 oz (69.9 kg)    Physical Exam Vitals and nursing note reviewed. Exam conducted with a chaperone present.  Constitutional:      General: She is awake. She is not in acute distress.    Appearance: She is well-developed and well-groomed. She is not ill-appearing or toxic-appearing.  HENT:     Head: Normocephalic and atraumatic.     Right Ear: Hearing, tympanic membrane and external ear normal. No drainage.     Left Ear: Hearing, tympanic membrane and external  ear normal. No drainage.     Ears:     Comments: Moderate erythema noted in both canals, no drainage.  TM intact bilaterally.    Nose: Nose normal.     Right Sinus: No maxillary sinus tenderness or frontal sinus tenderness.     Left Sinus: No maxillary sinus tenderness or frontal sinus tenderness.     Mouth/Throat:     Mouth: Mucous membranes are moist.     Pharynx: Oropharynx is clear. Uvula midline. No pharyngeal swelling, oropharyngeal exudate or posterior oropharyngeal erythema.  Eyes:     General: Lids are normal.        Right eye: No discharge.        Left eye: No discharge.     Extraocular Movements: Extraocular movements intact.     Conjunctiva/sclera: Conjunctivae normal.     Pupils: Pupils are equal, round, and reactive to light.     Visual Fields: Right eye visual fields normal and left eye visual fields normal.  Neck:     Thyroid: No thyromegaly.     Vascular: No carotid bruit.     Trachea: Trachea normal.  Cardiovascular:     Rate and Rhythm: Normal rate and regular rhythm.     Heart sounds: Normal heart sounds. No murmur heard.    No gallop.  Pulmonary:     Effort: Pulmonary effort is normal. No accessory muscle usage or respiratory distress.     Breath sounds: Normal breath sounds.  Abdominal:     General: Bowel sounds are normal.     Palpations: Abdomen is soft. There is no hepatomegaly or splenomegaly.     Tenderness: There is no abdominal  tenderness.  Musculoskeletal:        General: Normal range of motion.     Cervical back: Normal range of motion and neck supple. No torticollis. Pain with movement and muscular tenderness present. Normal range of motion.     Right lower leg: No edema.     Left lower leg: No edema.  Lymphadenopathy:     Head:     Right side of head: No submental, submandibular, tonsillar, preauricular or posterior auricular adenopathy.     Left side of head: No submental, submandibular, tonsillar, preauricular or posterior auricular  adenopathy.     Cervical: No cervical adenopathy.  Skin:    General: Skin is warm and dry.     Capillary Refill: Capillary refill takes less than 2 seconds.     Findings: Rash present. Rash is scaling.     Comments: Mild eczema rash to both hands.  Neurological:     Mental Status: She is alert and oriented to person, place, and time.     Gait: Gait is intact.     Deep Tendon Reflexes: Reflexes are normal and symmetric.     Reflex Scores:      Brachioradialis reflexes are 2+ on the right side and 2+ on the left side.      Patellar reflexes are 2+ on the right side and 2+ on the left side. Psychiatric:        Attention and Perception: Attention normal.        Mood and Affect: Mood normal.        Speech: Speech normal.        Behavior: Behavior normal. Behavior is cooperative.        Thought Content: Thought content normal.        Judgment: Judgment normal.     Results for orders placed or performed in visit on 03/26/22  Bayer DCA Hb A1c Waived  Result Value Ref Range   HB A1C (BAYER DCA - WAIVED) 5.3 4.8 - 5.6 %  Comprehensive metabolic panel  Result Value Ref Range   Glucose 85 70 - 99 mg/dL   BUN 6 6 - 20 mg/dL   Creatinine, Ser 0.44 (L) 0.57 - 1.00 mg/dL   eGFR 136 >59 mL/min/1.73   BUN/Creatinine Ratio 14 9 - 23   Sodium 137 134 - 144 mmol/L   Potassium 4.3 3.5 - 5.2 mmol/L   Chloride 104 96 - 106 mmol/L   CO2 20 20 - 29 mmol/L   Calcium 9.6 8.7 - 10.2 mg/dL   Total Protein 7.5 6.0 - 8.5 g/dL   Albumin 4.2 4.0 - 5.0 g/dL   Globulin, Total 3.3 1.5 - 4.5 g/dL   Albumin/Globulin Ratio 1.3 1.2 - 2.2   Bilirubin Total <0.2 0.0 - 1.2 mg/dL   Alkaline Phosphatase 56 44 - 121 IU/L   AST 27 0 - 40 IU/L   ALT 30 0 - 32 IU/L  VITAMIN D 25 Hydroxy (Vit-D Deficiency, Fractures)  Result Value Ref Range   Vit D, 25-Hydroxy 13.9 (L) 30.0 - 100.0 ng/mL  Vitamin B12  Result Value Ref Range   Vitamin B-12 336 232 - 1,245 pg/mL  TSH  Result Value Ref Range   TSH 1.350 0.450 -  4.500 uIU/mL  CBC with Differential/Platelet  Result Value Ref Range   WBC 4.8 3.4 - 10.8 x10E3/uL   RBC 4.84 3.77 - 5.28 x10E6/uL   Hemoglobin 13.7 11.1 - 15.9 g/dL   Hematocrit 40.3 34.0 - 46.6 %  MCV 83 79 - 97 fL   MCH 28.3 26.6 - 33.0 pg   MCHC 34.0 31.5 - 35.7 g/dL   RDW 13.5 11.7 - 15.4 %   Platelets 257 150 - 450 x10E3/uL   Neutrophils 43 Not Estab. %   Lymphs 41 Not Estab. %   Monocytes 7 Not Estab. %   Eos 7 Not Estab. %   Basos 2 Not Estab. %   Neutrophils Absolute 2.1 1.4 - 7.0 x10E3/uL   Lymphocytes Absolute 1.9 0.7 - 3.1 x10E3/uL   Monocytes Absolute 0.3 0.1 - 0.9 x10E3/uL   EOS (ABSOLUTE) 0.3 0.0 - 0.4 x10E3/uL   Basophils Absolute 0.1 0.0 - 0.2 x10E3/uL   Immature Granulocytes 0 Not Estab. %   Immature Grans (Abs) 0.0 0.0 - 0.1 x10E3/uL      Assessment & Plan:   Problem List Items Addressed This Visit       Respiratory   Allergic rhinitis    Ongoing, with underlying eczema.  Will trial Claritin 10 MG daily, has taken in past.  Recommend allergy testing in future.          Digestive   Gastroesophageal reflux disease    Chronic, ongoing.  She continues on Omeprazole daily with benefit, although reports sometimes has to take two.  Recommend heavy focus on diet and monitoring foods that caused discomfort for her.  Risks of PPI use were discussed with patient including bone loss, C. Diff diarrhea, pneumonia, infections, CKD, electrolyte abnormalities.  Verbalizes understanding and chooses to continue the medication.  Mag level annually.       Relevant Medications   omeprazole (PRILOSEC) 20 MG capsule   Other Relevant Orders   Magnesium     Nervous and Auditory   Acute swimmer's ear of both sides    Acute for several days -- start Ciprodex daily for 7 days and return to office if no improvement or ongoing symptoms.        Musculoskeletal and Integument   Dyshidrotic eczema    Chronic, ongoing.  No benefit from [place].  Will return to Triamcinolone  which offered more benefit, script sent in.  Referral to dermatology for further recommendations.      Relevant Orders   CBC with Differential/Platelet   Ambulatory referral to Dermatology     Other   Elevated low density lipoprotein (LDL) cholesterol level    Noted on past labs -- recommend continued focus on diet and recheck labs today.      Relevant Orders   Comprehensive metabolic panel   Lipid Panel w/o Chol/HDL Ratio   History of gestational diabetes - Primary    Recent A1c stable July 5.3 % -- will continue to monitor yearly.      Relevant Orders   Bayer DCA Hb A1c Waived   TSH   Neck pain    Chronic for 2 years -- muscles very tight on exam to back of neck.  She is a mother and wishes to avoid muscle relaxer due to fatigue with this.  Recommend continue Tylenol and Ibuprofen as needed.  Would look into massage therapy which would offer benefit to tension in neck.  Start using Voltaren gel as needed, massaged into neck.  Alert provider if worsening or ongoing.      Uses birth control    Ongoing, tolerating well.  Will check urine pregnancy testing today and CMP. Refills up to date.      Relevant Orders   Comprehensive metabolic panel  Pregnancy, urine   Vitamin D deficiency    Noted on past labs, has not taken supplement consistently.  Recheck level today.      Relevant Orders   VITAMIN D 25 Hydroxy (Vit-D Deficiency, Fractures)   Other Visit Diagnoses     Refused influenza vaccine       Refuses flu vaccine today.   Encounter for annual physical exam       Annual physical today with labs and health maintenance reviewed, discussed with patient.        Follow up plan: Return in about 1 year (around 07/18/2023) for Annual physical.   LABORATORY TESTING:  - Pap smear: up to date  IMMUNIZATIONS:   - Tdap: Tetanus vaccination status reviewed: last tetanus booster within 10 years. - Influenza: Refused - Pneumovax: Not applicable - Prevnar: Not  applicable - COVID: Up to date - HPV: Not applicable - Shingrix vaccine: Not applicable  SCREENING: -Mammogram: Not applicable  - Colonoscopy: Not applicable  - Bone Density: Not applicable  -Hearing Test: Not applicable  -Spirometry: Not applicable   PATIENT COUNSELING:   Advised to take 1 mg of folate supplement per day if capable of pregnancy.   Sexuality: Discussed sexually transmitted diseases, partner selection, use of condoms, avoidance of unintended pregnancy  and contraceptive alternatives.   Advised to avoid cigarette smoking.  I discussed with the patient that most people either abstain from alcohol or drink within safe limits (<=14/week and <=4 drinks/occasion for males, <=7/weeks and <= 3 drinks/occasion for females) and that the risk for alcohol disorders and other health effects rises proportionally with the number of drinks per week and how often a drinker exceeds daily limits.  Discussed cessation/primary prevention of drug use and availability of treatment for abuse.   Diet: Encouraged to adjust caloric intake to maintain  or achieve ideal body weight, to reduce intake of dietary saturated fat and total fat, to limit sodium intake by avoiding high sodium foods and not adding table salt, and to maintain adequate dietary potassium and calcium preferably from fresh fruits, vegetables, and low-fat dairy products.    Stressed the importance of regular exercise  Injury prevention: Discussed safety belts, safety helmets, smoke detector, smoking near bedding or upholstery.   Dental health: Discussed importance of regular tooth brushing, flossing, and dental visits.    NEXT PREVENTATIVE PHYSICAL DUE IN 1 YEAR. Return in about 1 year (around 07/18/2023) for Annual physical.

## 2022-07-18 LAB — CBC WITH DIFFERENTIAL/PLATELET
Basophils Absolute: 0.1 10*3/uL (ref 0.0–0.2)
Basos: 1 %
EOS (ABSOLUTE): 0.5 10*3/uL — ABNORMAL HIGH (ref 0.0–0.4)
Eos: 7 %
Hematocrit: 42 % (ref 34.0–46.6)
Hemoglobin: 13.8 g/dL (ref 11.1–15.9)
Immature Grans (Abs): 0 10*3/uL (ref 0.0–0.1)
Immature Granulocytes: 0 %
Lymphocytes Absolute: 2.3 10*3/uL (ref 0.7–3.1)
Lymphs: 31 %
MCH: 28.5 pg (ref 26.6–33.0)
MCHC: 32.9 g/dL (ref 31.5–35.7)
MCV: 87 fL (ref 79–97)
Monocytes Absolute: 0.4 10*3/uL (ref 0.1–0.9)
Monocytes: 5 %
Neutrophils Absolute: 4.1 10*3/uL (ref 1.4–7.0)
Neutrophils: 56 %
Platelets: 320 10*3/uL (ref 150–450)
RBC: 4.84 x10E6/uL (ref 3.77–5.28)
RDW: 12.8 % (ref 11.7–15.4)
WBC: 7.4 10*3/uL (ref 3.4–10.8)

## 2022-07-18 LAB — COMPREHENSIVE METABOLIC PANEL
ALT: 25 IU/L (ref 0–32)
AST: 19 IU/L (ref 0–40)
Albumin/Globulin Ratio: 1.3 (ref 1.2–2.2)
Albumin: 4.5 g/dL (ref 4.0–5.0)
Alkaline Phosphatase: 65 IU/L (ref 44–121)
BUN/Creatinine Ratio: 16 (ref 9–23)
BUN: 9 mg/dL (ref 6–20)
Bilirubin Total: 0.4 mg/dL (ref 0.0–1.2)
CO2: 22 mmol/L (ref 20–29)
Calcium: 9.6 mg/dL (ref 8.7–10.2)
Chloride: 101 mmol/L (ref 96–106)
Creatinine, Ser: 0.56 mg/dL — ABNORMAL LOW (ref 0.57–1.00)
Globulin, Total: 3.5 g/dL (ref 1.5–4.5)
Glucose: 94 mg/dL (ref 70–99)
Potassium: 4.2 mmol/L (ref 3.5–5.2)
Sodium: 140 mmol/L (ref 134–144)
Total Protein: 8 g/dL (ref 6.0–8.5)
eGFR: 127 mL/min/{1.73_m2} (ref >59)

## 2022-07-18 LAB — LIPID PANEL W/O CHOL/HDL RATIO
Cholesterol, Total: 250 mg/dL — ABNORMAL HIGH (ref 100–199)
HDL: 51 mg/dL (ref >39)
LDL Chol Calc (NIH): 149 mg/dL — ABNORMAL HIGH (ref 0–99)
Triglycerides: 272 mg/dL — ABNORMAL HIGH (ref 0–149)
VLDL Cholesterol Cal: 50 mg/dL — ABNORMAL HIGH (ref 5–40)

## 2022-07-18 LAB — MAGNESIUM: Magnesium: 1.8 mg/dL (ref 1.6–2.3)

## 2022-07-18 LAB — TSH: TSH: 1.53 u[IU]/mL (ref 0.450–4.500)

## 2022-07-18 LAB — VITAMIN D 25 HYDROXY (VIT D DEFICIENCY, FRACTURES): Vit D, 25-Hydroxy: 15.2 ng/mL — ABNORMAL LOW (ref 30.0–100.0)

## 2022-07-18 NOTE — Progress Notes (Signed)
Contacted via MyChart   Good afternoon Cheryl Wong, your labs have returned and overall look stable with a couple exceptions: - Vitamin D level remains a little low, please start taking Vitamin D3 over the counter 2000 units for overall bone health. - Your cholesterol is still high, but continued recommendations to make lifestyle changes. Your LDL is above normal. The LDL is the bad cholesterol. Over time and in combination with inflammation and other factors, this contributes to plaque which in turn may lead to stroke and/or heart attack down the road. Sometimes high LDL is primarily genetic, and people might be eating all the right foods but still have high numbers. Other times, there is room for improvement in one's diet and eating healthier can bring this number down and potentially reduce one's risk of heart attack and/or stroke.   To reduce your LDL, Remember - more fruits and vegetables, more fish, and limit red meat and dairy products. More soy, nuts, beans, barley, lentils, oats and plant sterol ester enriched margarine instead of butter. I also encourage eliminating sugar and processed food. Remember, shop on the outside of the grocery store and visit your [employer]. If you would like to talk with me about dietary changes for your cholesterol, please let me know. We should recheck your cholesterol in 12 months.  Any questions? Keep being amazing!!  Thank you for allowing me to participate in your care.  I appreciate you. Kindest regards, Anurag Scarfo

## 2023-01-15 ENCOUNTER — Ambulatory Visit: Payer: Self-pay | Admitting: *Deleted

## 2023-01-15 NOTE — Telephone Encounter (Signed)
Summary: pt having symptoms concering at nite   Pt fu [phone] PT is having stomach issues @ nite so bad makes her chest hurt and can not catch breath. Just wanted an appt, I ask if a nurse could fu with her, states only at nite but has problem catching breath and hurts in chest? Only at nite. Appt made fu at [phone]            Chief Complaint: upper abdominal pain at night Symptoms: upper abdominal pain happens at night . Chest pain shoot to back. Sour taste in mouth feel like can not catch her breath. Wakes up from sleep takes over 20 minutes to relieve sx after drinking milk. Has tried to take maalox/ [place] with no relief. Has not been able to pay for prilosec due to now charged $200 from pharmacy. After eating small meals feels full.  Frequency: 2 weeks ago and has had 2 episodes Pertinent Negatives: Patient denies sx now  Disposition: [] ED /[] Urgent Care (no appt availability in office) / [x] Appointment(In office/virtual)/ []  [facility]/ [] Home Care/ [] Refused Recommended Disposition /[] [facility]/ []  Follow-up with PCP Additional Notes:   Appt scheduled 01/17/23. Please advise if another medication can be prescribed that would not cost $200.     Reason for Disposition  [1] Abdominal pain is intermittent AND [2] shoots into chest, with sour taste in mouth  Answer Assessment - Initial Assessment Questions 1. LOCATION: "Where does it hurt?"      Upper abdomen  2. RADIATION: "Does the pain shoot anywhere else?" (e.g., chest, back)     Chest to back  3. ONSET: "When did the pain begin?" (e.g., minutes, hours or days ago)      Approx 2 weeks ago has had 2 different episodes that were severe enough to wake her up  4. SUDDEN: "Gradual or sudden onset?"     Happens at night only 5. PATTERN "Does the pain come and go, or is it constant?"    - If it comes and goes: "How long does it last?" "Do you have pain now?"     (Note: Comes and goes means the pain  is intermittent. It goes away completely between bouts.)    - If constant: "Is it getting better, staying the same, or getting worse?"      (Note: Constant means the pain never goes away completely; most serious pain is constant and gets worse.)      Comes and goes mostly happens at night wakes up from sleep 6. SEVERITY: "How bad is the pain?"  (e.g., Scale 1-10; mild, moderate, or severe)    - MILD (1-3): Doesn't interfere with normal activities, abdomen soft and not tender to touch..     - MODERATE (4-7): Interferes with normal activities or awakens from sleep, abdomen tender to touch.     - SEVERE (8-10): Excruciating pain, doubled over, unable to do any normal activities.       Moderate to severe at times.  7. RECURRENT SYMPTOM: "Have you ever had this type of stomach pain before?" If Yes, ask: "When was the last time?" and "What happened that time?"      Yes prior to gallbladder removal  8. AGGRAVATING FACTORS: "Does anything seem to cause this pain?" (e.g., foods, stress, alcohol)     Na  9. CARDIAC SYMPTOMS: "Do you have any of the following symptoms: chest pain, difficulty breathing, sweating, nausea?"     No sx reported now. Reports at  night upper abdominal pain chest pain can not catch breath, sour taste in mouth. Feels full after eating small amount of food  10. OTHER SYMPTOMS: "Do you have any other symptoms?" (e.g., back pain, diarrhea, fever, urination pain, vomiting)       See above  11. PREGNANCY: "Is there any chance you are pregnant?" "When was your last menstrual period?"       na  Protocols used: Abdominal Pain - Upper-A-AH

## 2023-01-17 ENCOUNTER — Ambulatory Visit: Payer: Self-pay | Admitting: Nurse Practitioner

## 2023-01-30 ENCOUNTER — Ambulatory Visit: Payer: Self-pay | Admitting: *Deleted

## 2023-01-30 NOTE — Telephone Encounter (Signed)
  Chief Complaint: abd pain radiating into her back.   Been seen for this and given medication but having issues with her Medicaid so is not on the medicine and her stomach is hurting again. Symptoms: abd pain radiating into her back.   Nausea Frequency: Since being off the medicine due to Medicaid issues. Pertinent Negatives: Patient denies N/A Disposition: [] ED /[] Urgent Care (no appt availability in office) / [x] Appointment(In office/virtual)/ []  [facility]/ [] Home Care/ [] Refused Recommended Disposition /[] [facility]/ []  Follow-up with PCP Additional Notes: Appt made with Cheryl Dials, NP for 02/28/2023 at 9:40.   She wanted to keep this appt so she would have time to get her Medicaid straightened out.    I instructed her to go to the ED if her pain became worse.

## 2023-01-30 NOTE — Telephone Encounter (Signed)
Reason for Disposition  [1] MODERATE pain (e.g., interferes with normal activities) AND [2] pain comes and goes (cramps) AND [3] present > 24 hours  (Exception: Pain with Vomiting or Diarrhea - see that Guideline.)  Answer Assessment - Initial Assessment Questions 1. LOCATION: "Where does it hurt?"      I'm hurting in the middle of my stomach.   The pain goes to my back.   I'm using the heartburn medicine but it's not helping. 2. RADIATION: "Does the pain shoot anywhere else?" (e.g., chest, back)     Yes to my back.   The pain is from the front to my back 3. ONSET: "When did the pain begin?" (e.g., minutes, hours or days ago)      I've been seen for it and given medication.   But I'm not on the medicine so it's hurting worse. I'm trying to get my Medicaid straightened out. 4. SUDDEN: "Gradual or sudden onset?"     Not asked 5. PATTERN "Does the pain come and go, or is it constant?"    - If it comes and goes: "How long does it last?" "Do you have pain now?"     (Note: Comes and goes means the pain is intermittent. It goes away completely between bouts.)    - If constant: "Is it getting better, staying the same, or getting worse?"      (Note: Constant means the pain never goes away completely; most serious pain is constant and gets worse.)      Constant  6. SEVERITY: "How bad is the pain?"  (e.g., Scale 1-10; mild, moderate, or severe)    - MILD (1-3): Doesn't interfere with normal activities, abdomen soft and not tender to touch.     - MODERATE (4-7): Interferes with normal activities or awakens from sleep, abdomen tender to touch.     - SEVERE (8-10): Excruciating pain, doubled over, unable to do any normal activities.       Moderate 7. RECURRENT SYMPTOM: "Have you ever had this type of stomach pain before?" If Yes, ask: "When was the last time?" and "What happened that time?"      I've been seen for this pain before. 8. CAUSE: "What do you think is causing the stomach pain?"     I don't  know 9. RELIEVING/AGGRAVATING FACTORS: "What makes it better or worse?" (e.g., antacids, bending or twisting motion, bowel movement)     Nothing helps     10. OTHER SYMPTOMS: "Do you have any other symptoms?" (e.g., back pain, diarrhea, fever, urination pain, vomiting)       I vomit after I eat.    I'm [place] but when I smell the food I get nauseas. 11. PREGNANCY: "Is there any chance you are pregnant?" "When was your last menstrual period?"       Not asked  Protocols used: Abdominal Pain - [facility]

## 2023-01-31 ENCOUNTER — Telehealth: Payer: Self-pay | Admitting: Nurse Practitioner

## 2023-01-31 NOTE — Telephone Encounter (Signed)
Copied from CRM [id number]. Topic: General - Other >> Jan 28, 2023  2:44 PM Franchot Heidelberg wrote: Reason for CRM: Pt called stating she needs help with reinstating her medicaid card, she says that she has been unsuccessful with attempting to reach Medicaid.

## 2023-02-04 ENCOUNTER — Telehealth: Payer: Self-pay | Admitting: *Deleted

## 2023-02-04 NOTE — Patient Outreach (Addendum)
  Care Coordination   Care Coordination  Visit Note   02/04/2023 Name: ALEXSANDRA ZETTLE MRN: [id number] DOB: 1996-08-27  Laurence A Brathwaite is a 27 y.o. year old female who sees [place], Corrie Dandy T, NP for primary care. I spoke with  Sheniah A Montrose  and her spouse today by phone today.  What matters to the patients health and wellness today?  Patient states that her Medicaid is no longer active, would like assistance with who to contact. This Child psychotherapist provided patient and her spouse with the customer services     Goals Addressed             This Visit's Progress    Care coordination activities       Interventions Today    Flowsheet Row Most Recent Value  General Interventions   General Interventions Discussed/Reviewed General Interventions Reviewed, [employer]  [patient confirmed that her medicaid is inactive-encouraged patient to contact customer services line for [employer] [phone]              SDOH assessments and interventions completed:  No     Care Coordination Interventions:  Yes, provided   Follow up plan: No further intervention required.   Encounter Outcome:  Pt. Visit Completed

## 2023-02-28 ENCOUNTER — Ambulatory Visit: Payer: Self-pay | Admitting: Nurse Practitioner

## 2023-05-05 ENCOUNTER — Other Ambulatory Visit: Payer: Self-pay | Admitting: Nurse Practitioner

## 2023-05-05 DIAGNOSIS — Z3041 Encounter for surveillance of contraceptive pills: Secondary | ICD-10-CM

## 2023-05-05 NOTE — Telephone Encounter (Signed)
Medication Refill - Medication: Norgestimate-Ethinyl Estradiol Triphasic (TRI-SPRINTEC) 0.18/0.215/0.25 MG-35 MCG tablet   Has the patient contacted their pharmacy? Yes.   (Agent: If no, request that the patient contact the pharmacy for the refill. If patient does not wish to contact the pharmacy document the reason why and proceed with request.) (Agent: If yes, when and what did the pharmacy advise?)  Preferred Pharmacy (with phone number or street name):  [id number] [place], [place] - [address] AT [facility] OF [address] [facility]  [address] [place] [address]  Phone: [phone] Fax: [phone]   Has the patient been seen for an appointment in the last year OR does the patient have an upcoming appointment? Yes.    Agent: Please be advised that RX refills may take up to 3 business days. We ask that you follow-up with your pharmacy.

## 2023-05-06 MED ORDER — NORGESTIM-ETH ESTRAD TRIPHASIC 0.18/0.215/0.25 MG-35 MCG PO TABS: 1.0000 | ORAL_TABLET | Freq: Every day | ORAL | 4 refills | Status: DC

## 2023-05-06 NOTE — Telephone Encounter (Signed)
Requested Prescriptions  Pending Prescriptions Disp Refills   Norgestimate-Ethinyl Estradiol Triphasic (TRI-SPRINTEC) 0.18/0.215/0.25 MG-35 MCG tablet 84 tablet 4    Sig: Take 1 tablet by mouth daily.     OB/GYN:  Contraceptives Passed - 05/05/2023 11:20 AM      Passed - Last BP in normal range    BP Readings from Last 1 Encounters:  07/17/22 93/63         Passed - Valid encounter within last 12 months    Recent Outpatient Visits           9 months ago History of gestational diabetes   [place] [facility] [place], Corrie Dandy T, NP   1 year ago History of gestational diabetes   [place] [facility] [place], Corrie Dandy T, NP   1 year ago Acute otitis externa of left ear, unspecified type   [place] [facility] [place], Jake Church, NP   1 year ago Encounter to establish care   [place] [facility] Marjie Skiff, NP       Future Appointments             In 4 weeks Deirdre Evener, MD [facility]   In 2 months [place], Dorie Rank, NP [facility]            Passed - Patient is not a smoker

## 2023-06-04 ENCOUNTER — Ambulatory Visit: Payer: Medicaid Other | Admitting: Dermatology

## 2023-07-21 ENCOUNTER — Encounter: Payer: Medicaid Other | Admitting: Nurse Practitioner

## 2023-07-21 DIAGNOSIS — K219 Gastro-esophageal reflux disease without esophagitis: Secondary | ICD-10-CM

## 2023-07-21 DIAGNOSIS — Z23 Encounter for immunization: Secondary | ICD-10-CM

## 2023-07-21 DIAGNOSIS — Z8632 Personal history of gestational diabetes: Secondary | ICD-10-CM

## 2023-07-21 DIAGNOSIS — E559 Vitamin D deficiency, unspecified: Secondary | ICD-10-CM

## 2023-07-21 DIAGNOSIS — Z789 Other specified health status: Secondary | ICD-10-CM

## 2023-07-21 DIAGNOSIS — E7800 Pure hypercholesterolemia, unspecified: Secondary | ICD-10-CM

## 2023-07-21 DIAGNOSIS — Z0000 Encounter for general adult medical examination without abnormal findings: Secondary | ICD-10-CM

## 2023-07-21 DIAGNOSIS — Z124 Encounter for screening for malignant neoplasm of cervix: Secondary | ICD-10-CM

## 2023-07-22 ENCOUNTER — Telehealth: Payer: Self-pay | Admitting: Nurse Practitioner

## 2023-07-22 NOTE — Telephone Encounter (Signed)
Patients insurance is not active at this time. Tried to call and mailbox is full.

## 2023-07-22 NOTE — Telephone Encounter (Signed)
-----   Message from Marjie Skiff sent at 07/21/2023  9:33 AM EST ----- Can we see if she was able to get Medicaid reinstated?  Looks like she was having issues with this in May and might be why she no showed for physical.  See if social work was able to assist her.

## 2023-07-29 ENCOUNTER — Other Ambulatory Visit: Payer: Self-pay | Admitting: Nurse Practitioner

## 2023-07-29 DIAGNOSIS — Z3041 Encounter for surveillance of contraceptive pills: Secondary | ICD-10-CM

## 2023-07-30 NOTE — Telephone Encounter (Signed)
Requested Prescriptions  Refused Prescriptions Disp Refills   TRI-SPRINTEC 0.18/0.215/0.25 MG-35 MCG tablet [Pharmacy Med Name: TRI-SPRINTEC TABLETS 28S] 84 tablet 4    Sig: TAKE 1 TABLET BY MOUTH DAILY     OB/GYN:  Contraceptives Failed - 07/29/2023 10:29 AM      Failed - Valid encounter within last 12 months    Recent Outpatient Visits           1 year ago History of gestational diabetes   [place] [facility] [place], Corrie Dandy T, NP   1 year ago History of gestational diabetes   [place] [facility] [place], Corrie Dandy T, NP   1 year ago Acute otitis externa of left ear, unspecified type   [place] [facility] McElwee, Lauren A, NP   2 years ago Encounter to establish care   [place] [facility] [place], [place] T, NP              Passed - Last BP in normal range    BP Readings from Last 1 Encounters:  07/17/22 93/63         Passed - Patient is not a smoker

## 2024-03-06 NOTE — Patient Instructions (Incomplete)
 Abdominal Pain, Adult    Many things can cause belly (abdominal) pain. In most cases, belly pain is not a serious problem and can be watched and treated at home. But in some cases, it can be serious.  Your doctor will try to find the cause of your belly pain.  Follow these instructions at home:  Medicines  Take over-the-counter and prescription medicines only as told by your doctor.  Do not take medicines that help you poop (laxatives) unless told by your doctor.  General instructions  Watch your belly pain for any changes. Tell your doctor if the pain gets worse.  Drink enough fluid to keep your pee (urine) pale yellow.  Contact a doctor if:  Your belly pain changes or gets worse.  You have very bad cramping or bloating in your belly.  You vomit.  Your pain gets worse with meals, after eating, or with certain foods.  You have trouble pooping or have watery poop for more than 2-3 days.  You are not hungry, or you lose weight without trying.  You have signs of not getting enough fluid or water (dehydration). These may include:  Dark pee, very Chastain pee, or no pee.  Cracked lips or dry mouth.  Feeling sleepy or weak.  You have pain when you pee or poop.  Your belly pain wakes you up at night.  You have blood in your pee.  You have a fever.  Get help right away if:  You cannot stop vomiting.  Your pain is only in one part of your belly, like on the right side.  You have bloody or black poop, or poop that looks like tar.  You have trouble breathing.  You have chest pain.  These symptoms may be an emergency. Get help right away. Call 911.  Do not wait to see if the symptoms will go away.  Do not drive yourself to the hospital.  This information is not intended to replace advice given to you by your health care provider. Make sure you discuss any questions you have with your health care provider.  Document Revised: 06/19/2022 Document Reviewed: 06/19/2022  Elsevier Patient Education  2024 [employer].

## 2024-03-06 NOTE — Patient Instructions (Incomplete)

## 2024-03-08 ENCOUNTER — Ambulatory Visit: Payer: Self-pay | Admitting: Nurse Practitioner

## 2024-03-08 ENCOUNTER — Ambulatory Visit (INDEPENDENT_AMBULATORY_CARE_PROVIDER_SITE_OTHER): Payer: Self-pay | Admitting: Nurse Practitioner

## 2024-03-08 ENCOUNTER — Encounter: Payer: Self-pay | Admitting: Nurse Practitioner

## 2024-03-08 VITALS — BP 99/65 | HR 85 | Temp 98.9°F | Ht 61.0 in | Wt 146.3 lb

## 2024-03-08 DIAGNOSIS — Z789 Other specified health status: Secondary | ICD-10-CM

## 2024-03-08 DIAGNOSIS — K219 Gastro-esophageal reflux disease without esophagitis: Secondary | ICD-10-CM

## 2024-03-08 DIAGNOSIS — R1084 Generalized abdominal pain: Secondary | ICD-10-CM | POA: Insufficient documentation

## 2024-03-08 LAB — MICROSCOPIC EXAMINATION: RBC, Urine: NONE SEEN /HPF (ref 0–2)

## 2024-03-08 LAB — URINALYSIS, ROUTINE W REFLEX MICROSCOPIC
Bilirubin, UA: NEGATIVE
Glucose, UA: NEGATIVE
Ketones, UA: NEGATIVE
Nitrite, UA: NEGATIVE
Protein,UA: NEGATIVE
RBC, UA: NEGATIVE
Specific Gravity, UA: 1.02 (ref 1.005–1.030)
Urobilinogen, Ur: 0.2 mg/dL (ref 0.2–1.0)
pH, UA: 7 (ref 5.0–7.5)

## 2024-03-08 LAB — WET PREP FOR TRICH, YEAST, CLUE
Clue Cell Exam: NEGATIVE
Trichomonas Exam: NEGATIVE
Yeast Exam: NEGATIVE

## 2024-03-08 LAB — PREGNANCY, URINE: Preg Test, Ur: NEGATIVE

## 2024-03-08 MED ORDER — OMEPRAZOLE 20 MG PO CPDR: DELAYED_RELEASE_CAPSULE | ORAL | 4 refills | Status: DC

## 2024-03-08 MED ORDER — NORGESTIM-ETH ESTRAD TRIPHASIC 0.18/0.215/0.25 MG-35 MCG PO TABS: 1.0000 | ORAL_TABLET | Freq: Every day | ORAL | 4 refills | Status: DC

## 2024-03-08 NOTE — Assessment & Plan Note (Signed)
 Ongoing, tolerating well.  Will check urine pregnancy testing today and CMP. Refills up to date. She prefers to hold off on IUD due to side effect concerns.

## 2024-03-08 NOTE — Assessment & Plan Note (Signed)
 Ongoing and intermittent for 6 months.  Generalized tenderness on exam.  Will check labs today: CBC, CMP, TSH, UA, and wet prep.  Treat as needed.  Refills on Omeprazole  sent and recommend she start taking Metamucil powder daily to help with constipation.  Determine next steps after all labs returned.

## 2024-03-08 NOTE — Progress Notes (Signed)
 Contacted via MyChart  Overall urine and vaginal swab are not showing infection.  We will see what labs show.:)

## 2024-03-08 NOTE — Progress Notes (Signed)
 BP 99/65   Pulse 85   Temp 98.9 F (37.2 C) (Oral)   Ht 5' 1 (1.549 m)   Wt 146 lb 4.8 oz (66.4 kg)   LMP 02/17/2024 (Approximate)   SpO2 98%   Breastfeeding No   BMI 27.64 kg/m    Subjective:    Patient ID: Cheryl Wong, female    DOB: [birth year], 28 y.o.   MRN: [id number]  HPI: Cheryl Wong is a 28 y.o. female  Chief Complaint  Patient presents with   Abdominal Pain    Patient states she has been having issues with feeling bloated and off and on constipation over the last 6 months. States she occasionally has to take medication to help her go to the bathroom.    Contraception    Patient wants to discuss possibly switching to an IUD instead of BCP's   ABDOMINAL PAIN  For 6 months has been coming and going.  Sometimes she cannot use the bathroom for 3-4 days.  Feels distended.  Eats 2 meals a day. At times uses Senna pills, but these did not work. Green tea now, helps some.  Has hemorrhoids which started after birth of first child.  She wanted to discuss IUD vs birth control pills as well today. Provided at length education on both risks and benefits. She prefers to stay with birth control. Duration:months Onset: gradual Severity: 7/10 Quality: dull, aching, cramping, and throbbing Location:  diffuse  Episode duration: once uses Green Tea it will go away, one month ago lasted hours Radiation: no Frequency: intermittent Alleviating factors: Green Tea Aggravating factors: unknown Status: fluctuating Treatments attempted: Senna, Green Tea, heartburn medication Fever: no Nausea: yes one month ago Vomiting: yes one month ago Weight loss: no Decreased appetite: no Diarrhea: no Constipation: yes - every day if uses Green Tea no issues and no straining Blood in stool: no Heartburn: yes Jaundice: no Rash: no Dysuria/urinary frequency: no Hematuria: no History of sexually transmitted disease: no Recurrent NSAID use: no      03/08/2024    3:05 PM 07/17/2022     9:12 AM 07/16/2021    8:12 AM 06/27/2021   11:49 AM 01/19/2021    9:59 AM  Depression screen PHQ 2/9  Decreased Interest 0 0 0 0 1  Down, Depressed, Hopeless 0 0 0 0 0  PHQ - 2 Score 0 0 0 0 1  Altered sleeping 0 0 2  0  Tired, decreased energy 0 0 2  0  Change in appetite 0 0 0  0  Feeling bad or failure about yourself  0 0 0  0  Trouble concentrating 0 0 0  0  Moving slowly or fidgety/restless 0 0 0  0  Suicidal thoughts 0 0 0  0  PHQ-9 Score 0 0 4  1  Difficult doing work/chores Not difficult at all Not difficult at all Not difficult at all  Not difficult at all       03/08/2024    3:05 PM 07/17/2022    9:13 AM 01/19/2021   10:00 AM  GAD 7 : Generalized Anxiety Score  Nervous, Anxious, on Edge 0 0 0  Control/stop worrying 0 0 0  Worry too much - different things 0 0 0  Trouble relaxing 0 0 0  Restless 0 0 0  Easily annoyed or irritable 0 0 0  Afraid - awful might happen 0 0 0  Total GAD 7 Score 0 0 0  Anxiety Difficulty Not  difficult at all Not difficult at all       Relevant past medical, surgical, family and social history reviewed and updated as indicated. Interim medical history since our last visit reviewed. Allergies and medications reviewed and updated.  Review of Systems  Constitutional:  Negative for activity change, appetite change, diaphoresis, fatigue and fever.  Respiratory:  Negative for cough, chest tightness and shortness of breath.   Cardiovascular:  Negative for chest pain, palpitations and leg swelling.  Gastrointestinal:  Positive for abdominal distention, abdominal pain, constipation, nausea (one month ago) and vomiting (one month ago). Negative for diarrhea.  Endocrine: Negative for cold intolerance, heat intolerance, polydipsia, polyphagia and polyuria.  Neurological: Negative.   Psychiatric/Behavioral: Negative.      Per HPI unless specifically indicated above     Objective:    BP 99/65   Pulse 85   Temp 98.9 F (37.2 C) (Oral)   Ht 5' 1  (1.549 m)   Wt 146 lb 4.8 oz (66.4 kg)   LMP 02/17/2024 (Approximate)   SpO2 98%   Breastfeeding No   BMI 27.64 kg/m   Wt Readings from Last 3 Encounters:  03/08/24 146 lb 4.8 oz (66.4 kg)  07/17/22 147 lb 1.6 oz (66.7 kg)  03/26/22 151 lb 9.6 oz (68.8 kg)    Physical Exam Vitals and nursing note reviewed.  Constitutional:      General: She is awake. She is not in acute distress.    Appearance: She is well-developed and well-groomed. She is not ill-appearing or toxic-appearing.  HENT:     Head: Normocephalic.     Right Ear: Hearing and external ear normal.     Left Ear: Hearing and external ear normal.   Eyes:     General: Lids are normal.        Right eye: No discharge.        Left eye: No discharge.     Conjunctiva/sclera: Conjunctivae normal.     Pupils: Pupils are equal, round, and reactive to light.   Neck:     Thyroid: No thyromegaly.     Vascular: No carotid bruit.   Cardiovascular:     Rate and Rhythm: Normal rate and regular rhythm.     Heart sounds: Normal heart sounds. No murmur heard.    No gallop.  Pulmonary:     Effort: Pulmonary effort is normal. No accessory muscle usage or respiratory distress.     Breath sounds: Normal breath sounds. No decreased breath sounds, wheezing or rales.  Abdominal:     General: Bowel sounds are normal. There is no distension or abdominal bruit.     Palpations: Abdomen is soft. There is no hepatomegaly.     Tenderness: There is generalized abdominal tenderness. There is no right CVA tenderness or left CVA tenderness.   Musculoskeletal:     Cervical back: Normal range of motion and neck supple.     Right lower leg: No edema.     Left lower leg: No edema.  Lymphadenopathy:     Cervical: No cervical adenopathy.   Skin:    General: Skin is warm and dry.   Neurological:     Mental Status: She is alert and oriented to person, place, and time.     Deep Tendon Reflexes: Reflexes are normal and symmetric.     Reflex  Scores:      Brachioradialis reflexes are 2+ on the right side and 2+ on the left side.      Patellar  reflexes are 2+ on the right side and 2+ on the left side.  Psychiatric:        Attention and Perception: Attention normal.        Mood and Affect: Mood normal.        Speech: Speech normal.        Behavior: Behavior normal. Behavior is cooperative.        Thought Content: Thought content normal.     Results for orders placed or performed in visit on 07/17/22  Bayer DCA Hb A1c Waived   Collection Time: 07/17/22  9:14 AM  Result Value Ref Range   HB A1C (BAYER DCA - WAIVED) 5.4 4.8 - 5.6 %  Pregnancy, urine   Collection Time: 07/17/22  9:14 AM  Result Value Ref Range   Preg Test, Ur Negative Negative  CBC with Differential/Platelet   Collection Time: 07/17/22  9:18 AM  Result Value Ref Range   WBC 7.4 3.4 - 10.8 x10E3/uL   RBC 4.84 3.77 - 5.28 x10E6/uL   Hemoglobin 13.8 11.1 - 15.9 g/dL   Hematocrit 57.9 65.9 - 46.6 %   MCV 87 79 - 97 fL   MCH 28.5 26.6 - 33.0 pg   MCHC 32.9 31.5 - 35.7 g/dL   RDW 87.1 88.2 - 84.5 %   Platelets 320 150 - 450 x10E3/uL   Neutrophils 56 Not Estab. %   Lymphs 31 Not Estab. %   Monocytes 5 Not Estab. %   Eos 7 Not Estab. %   Basos 1 Not Estab. %   Neutrophils Absolute 4.1 1.4 - 7.0 x10E3/uL   Lymphocytes Absolute 2.3 0.7 - 3.1 x10E3/uL   Monocytes Absolute 0.4 0.1 - 0.9 x10E3/uL   EOS (ABSOLUTE) 0.5 (H) 0.0 - 0.4 x10E3/uL   Basophils Absolute 0.1 0.0 - 0.2 x10E3/uL   Immature Granulocytes 0 Not Estab. %   Immature Grans (Abs) 0.0 0.0 - 0.1 x10E3/uL  Comprehensive metabolic panel   Collection Time: 07/17/22  9:18 AM  Result Value Ref Range   Glucose 94 70 - 99 mg/dL   BUN 9 6 - 20 mg/dL   Creatinine, Ser 9.43 (L) 0.57 - 1.00 mg/dL   eGFR 872 >40 fO/fpw/8.26   BUN/Creatinine Ratio 16 9 - 23   Sodium 140 134 - 144 mmol/L   Potassium 4.2 3.5 - 5.2 mmol/L   Chloride 101 96 - 106 mmol/L   CO2 22 20 - 29 mmol/L   Calcium 9.6 8.7 - 10.2  mg/dL   Total Protein 8.0 6.0 - 8.5 g/dL   Albumin 4.5 4.0 - 5.0 g/dL   Globulin, Total 3.5 1.5 - 4.5 g/dL   Albumin/Globulin Ratio 1.3 1.2 - 2.2   Bilirubin Total 0.4 0.0 - 1.2 mg/dL   Alkaline Phosphatase 65 44 - 121 IU/L   AST 19 0 - 40 IU/L   ALT 25 0 - 32 IU/L  Lipid Panel w/o Chol/HDL Ratio   Collection Time: 07/17/22  9:18 AM  Result Value Ref Range   Cholesterol, Total 250 (H) 100 - 199 mg/dL   Triglycerides 727 (H) 0 - 149 mg/dL   HDL 51 >60 mg/dL   VLDL Cholesterol Cal 50 (H) 5 - 40 mg/dL   LDL Chol Calc (NIH) 850 (H) 0 - 99 mg/dL  VITAMIN D  25 Hydroxy (Vit-D Deficiency, Fractures)   Collection Time: 07/17/22  9:18 AM  Result Value Ref Range   Vit D, 25-Hydroxy 15.2 (L) 30.0 - 100.0 ng/mL  TSH  Collection Time: 07/17/22  9:18 AM  Result Value Ref Range   TSH 1.530 0.450 - 4.500 uIU/mL  Magnesium    Collection Time: 07/17/22  9:18 AM  Result Value Ref Range   Magnesium  1.8 1.6 - 2.3 mg/dL      Assessment & Plan:   Problem List Items Addressed This Visit       Digestive   Gastroesophageal reflux disease   Chronic, ongoing.  She continues on Omeprazole  daily with benefit.  Recommend heavy focus on diet and monitoring foods that caused discomfort for her.  Risks of PPI use were discussed with patient including bone loss, C. Diff diarrhea, pneumonia, infections, CKD, electrolyte abnormalities.  Verbalizes understanding and chooses to continue the medication.  Mag level annually.       Relevant Medications   omeprazole  (PRILOSEC) 20 MG capsule     Other   Uses birth control   Ongoing, tolerating well.  Will check urine pregnancy testing today and CMP. Refills up to date. She prefers to hold off on IUD due to side effect concerns.      Relevant Medications   Norgestimate-Ethinyl Estradiol Triphasic (TRI-SPRINTEC) 0.18/0.215/0.25 MG-35 MCG tablet   Generalized abdominal pain - Primary   Ongoing and intermittent for 6 months.  Generalized tenderness on exam.   Will check labs today: CBC, CMP, TSH, UA, and wet prep.  Treat as needed.  Refills on Omeprazole  sent and recommend she start taking Metamucil powder daily to help with constipation.  Determine next steps after all labs returned.      Relevant Orders   Urinalysis, Routine w reflex microscopic   WET PREP FOR TRICH, YEAST, CLUE   Pregnancy, urine   CBC with Differential/Platelet   Comprehensive metabolic panel with GFR   TSH     Follow up plan: Return in about 8 weeks (around 05/03/2024) for Abdominal Pain.

## 2024-03-08 NOTE — Assessment & Plan Note (Signed)
 Chronic, ongoing.  She continues on Omeprazole  daily with benefit.  Recommend heavy focus on diet and monitoring foods that caused discomfort for her.  Risks of PPI use were discussed with patient including bone loss, C. Diff diarrhea, pneumonia, infections, CKD, electrolyte abnormalities.  Verbalizes understanding and chooses to continue the medication.  Mag level annually.

## 2024-03-09 ENCOUNTER — Encounter: Payer: Self-pay | Admitting: Nurse Practitioner

## 2024-03-09 LAB — COMPREHENSIVE METABOLIC PANEL WITH GFR
ALT: 22 IU/L (ref 0–32)
AST: 19 IU/L (ref 0–40)
Albumin: 4.3 g/dL (ref 4.0–5.0)
Alkaline Phosphatase: 47 IU/L (ref 44–121)
BUN/Creatinine Ratio: 15 (ref 9–23)
BUN: 8 mg/dL (ref 6–20)
Bilirubin Total: 0.2 mg/dL (ref 0.0–1.2)
CO2: 22 mmol/L (ref 20–29)
Calcium: 9.6 mg/dL (ref 8.7–10.2)
Chloride: 105 mmol/L (ref 96–106)
Creatinine, Ser: 0.55 mg/dL — ABNORMAL LOW (ref 0.57–1.00)
Globulin, Total: 3 g/dL (ref 1.5–4.5)
Glucose: 70 mg/dL (ref 70–99)
Potassium: 4 mmol/L (ref 3.5–5.2)
Sodium: 140 mmol/L (ref 134–144)
Total Protein: 7.3 g/dL (ref 6.0–8.5)
eGFR: 127 mL/min/{1.73_m2} (ref >59)

## 2024-03-09 LAB — CBC WITH DIFFERENTIAL/PLATELET
Basophils Absolute: 0.1 10*3/uL (ref 0.0–0.2)
Basos: 1 %
EOS (ABSOLUTE): 0.2 10*3/uL (ref 0.0–0.4)
Eos: 3 %
Hematocrit: 41.9 % (ref 34.0–46.6)
Hemoglobin: 14.1 g/dL (ref 11.1–15.9)
Immature Grans (Abs): 0 10*3/uL (ref 0.0–0.1)
Immature Granulocytes: 0 %
Lymphocytes Absolute: 2.2 10*3/uL (ref 0.7–3.1)
Lymphs: 34 %
MCH: 30.9 pg (ref 26.6–33.0)
MCHC: 33.7 g/dL (ref 31.5–35.7)
MCV: 92 fL (ref 79–97)
Monocytes Absolute: 0.4 10*3/uL (ref 0.1–0.9)
Monocytes: 6 %
Neutrophils Absolute: 3.6 10*3/uL (ref 1.4–7.0)
Neutrophils: 56 %
Platelets: 282 10*3/uL (ref 150–450)
RBC: 4.57 x10E6/uL (ref 3.77–5.28)
RDW: 12.7 % (ref 11.7–15.4)
WBC: 6.5 10*3/uL (ref 3.4–10.8)

## 2024-03-09 LAB — TSH: TSH: 1.14 u[IU]/mL (ref 0.450–4.500)

## 2024-03-09 NOTE — Progress Notes (Signed)
 Contacted via MyChart  Good morning Cheryl Wong, your labs have returned and are overall stable.  Kidney function, creatinine and eGFR, remains normal, as is liver function, AST and ALT.  Thyroid normal.  I would recommend if pain continues we get some imaging of abdomen to further evaluate.  Any questions? Keep being stellar!!  Thank you for allowing me to participate in your care.  I appreciate you. Kindest regards, Rosalva Neary

## 2024-03-11 ENCOUNTER — Ambulatory Visit: Payer: Self-pay

## 2024-03-11 ENCOUNTER — Telehealth: Payer: Self-pay

## 2024-03-11 DIAGNOSIS — R1084 Generalized abdominal pain: Secondary | ICD-10-CM

## 2024-03-11 MED ORDER — OMEPRAZOLE 20 MG PO CPDR: 20.0000 mg | DELAYED_RELEASE_CAPSULE | Freq: Two times a day (BID) | ORAL | 1 refills | Status: DC

## 2024-03-11 NOTE — Addendum Note (Signed)
 Addended by: Tyteanna Ost T on: 03/11/2024 12:15 PM   Modules accepted: Orders

## 2024-03-11 NOTE — Telephone Encounter (Signed)
 Copied from CRM [id number]. Topic: Clinical - Lab/Test Results >> Mar 11, 2024  9:22 AM Ivette P wrote: Reason for CRM: PT called in about lab results read as follows    Valerio Melanie DASEN, NP    03/09/24  9:48 AM Note Contacted via MyChart  Good morning Margarett, your labs have returned and are overall stable.  Kidney function, creatinine and eGFR, remains normal, as is liver function, AST and ALT.  Thyroid normal.  I would recommend if pain continues we get some imaging of abdomen to further evaluate.  Any questions? Keep being stellar!!  Thank you for allowing me to participate in your care.  I appreciate you. Kindest regards, [name]     Pt understood, no further questions.

## 2024-03-11 NOTE — Telephone Encounter (Signed)
 FYI Only or Action Required?: Action required by provider: clinical question for provider and update on patient condition.  Patient was last seen in primary care on 03/08/2024 by Cannady, [name] T, NP. Called Nurse Triage reporting Abdominal Pain and Advice Only. Symptoms began several months ago. Interventions attempted: Prescription medications: omeprazole . Symptoms are: unchanged.  Triage Disposition: Call PCP When Office is Open  Patient/caregiver understands and will follow disposition?: Yes                             Copied from CRM [id number]. Topic: Clinical - Red Word Triage >> Mar 11, 2024  9:23 AM Ivette P wrote: Red Word that prompted transfer to Nurse Triage: abdominal pain. Pt was recently seen but was not Treated. Reason for Disposition  [1] Caller requesting NON-URGENT health information AND [2] PCP's office is the best resource  Answer Assessment - Initial Assessment Questions 1. REASON FOR CALL or QUESTION: What is your reason for calling today? or How can I best help you? or What question do you have that I can help answer?     Patient is experiencing abdominal pain and was seen for OV on 03/08/24. Patient had labs drawn. This RN read provider's result note to patient. Providers suggested additional imaging, if pain persists. Patient stated she is still experiencing abdominal pain and would like to speak with provider on how to proceed.  Protocols used: Information Only Call - No Triage-A-AH

## 2024-03-11 NOTE — Telephone Encounter (Signed)
Called and notified patient of [name]'s message. Patient verbalized understanding.

## 2024-03-16 ENCOUNTER — Ambulatory Visit
Admission: RE | Admit: 2024-03-16 | Discharge: 2024-03-16 | Disposition: A | Discharge status: Home / Self Care | Source: Ambulatory Visit | Attending: Nurse Practitioner | Admitting: Nurse Practitioner

## 2024-03-16 DIAGNOSIS — R1084 Generalized abdominal pain: Secondary | ICD-10-CM | POA: Insufficient documentation

## 2024-03-17 ENCOUNTER — Ambulatory Visit: Payer: Self-pay | Admitting: Nurse Practitioner

## 2024-03-20 NOTE — Patient Instructions (Signed)

## 2024-03-23 ENCOUNTER — Encounter: Payer: Self-pay | Admitting: Nurse Practitioner

## 2024-03-23 ENCOUNTER — Ambulatory Visit (INDEPENDENT_AMBULATORY_CARE_PROVIDER_SITE_OTHER): Payer: Self-pay | Admitting: Nurse Practitioner

## 2024-03-23 VITALS — BP 96/64 | HR 87 | Temp 98.2°F | Ht 61.2 in | Wt 142.6 lb

## 2024-03-23 DIAGNOSIS — E7800 Pure hypercholesterolemia, unspecified: Secondary | ICD-10-CM

## 2024-03-23 DIAGNOSIS — J4520 Mild intermittent asthma, uncomplicated: Secondary | ICD-10-CM

## 2024-03-23 DIAGNOSIS — Z0000 Encounter for general adult medical examination without abnormal findings: Secondary | ICD-10-CM

## 2024-03-23 DIAGNOSIS — E559 Vitamin D deficiency, unspecified: Secondary | ICD-10-CM | POA: Diagnosis not present

## 2024-03-23 DIAGNOSIS — K219 Gastro-esophageal reflux disease without esophagitis: Secondary | ICD-10-CM

## 2024-03-23 DIAGNOSIS — H539 Unspecified visual disturbance: Secondary | ICD-10-CM

## 2024-03-23 DIAGNOSIS — R238 Other skin changes: Secondary | ICD-10-CM

## 2024-03-23 NOTE — Assessment & Plan Note (Signed)
Noted on past labs, has not taken supplement consistently.  Recheck level today.

## 2024-03-23 NOTE — Progress Notes (Signed)
 BP 96/64   Pulse 87   Temp 98.2 F (36.8 C) (Oral)   Ht 5' 1.2 (1.554 m)   Wt 142 lb 9.6 oz (64.7 kg)   LMP 03/17/2024 (Approximate)   SpO2 98%   BMI 26.77 kg/m    Subjective:    Patient ID: Cheryl Wong, female    DOB: 1995/12/18, 28 y.o.   MRN: [id number]  HPI: Cheryl Wong is a 28 y.o. female presenting on 03/23/2024 for comprehensive medical examination. Current medical complaints include:none  She currently lives with: husband and children Menopausal Symptoms: no  Would like to see eye doctor and skin doctor.  Depression Screen done today and results listed below:     03/23/2024    1:32 PM 03/08/2024    3:05 PM 07/17/2022    9:12 AM 07/16/2021    8:12 AM 06/27/2021   11:49 AM  Depression screen PHQ 2/9  Decreased Interest 0 0 0 0 0  Down, Depressed, Hopeless 0 0 0 0 0  PHQ - 2 Score 0 0 0 0 0  Altered sleeping 0 0 0 2   Tired, decreased energy 0 0 0 2   Change in appetite 0 0 0 0   Feeling bad or failure about yourself  0 0 0 0   Trouble concentrating 0 0 0 0   Moving slowly or fidgety/restless 0 0 0 0   Suicidal thoughts 0 0 0 0   PHQ-9 Score 0 0 0 4   Difficult doing work/chores Not difficult at all Not difficult at all Not difficult at all Not difficult at all       03/23/2024    1:32 PM 03/08/2024    3:05 PM 07/17/2022    9:13 AM 01/19/2021   10:00 AM  GAD 7 : Generalized Anxiety Score  Nervous, Anxious, on Edge 0 0 0 0  Control/stop worrying 0 0 0 0  Worry too much - different things 0 0 0 0  Trouble relaxing 0 0 0 0  Restless 0 0 0 0  Easily annoyed or irritable 0 0 0 0  Afraid - awful might happen 0 0 0 0  Total GAD 7 Score 0 0 0 0  Anxiety Difficulty Not difficult at all Not difficult at all Not difficult at all       12/14/2020    9:25 AM 12/29/2020    6:02 PM 07/16/2021    8:12 AM 07/17/2022    9:12 AM 03/23/2024    1:32 PM  Fall Risk  Falls in the past year? 0  0 1 0  Was there an injury with Fall? 0  0 0 0  Fall Risk Category Calculator  0  0 2 0  Fall Risk Category (Retired) Low   Low  Moderate    (RETIRED) Patient Fall Risk Level  Low fall risk  Low fall risk  Moderate fall risk    Patient at Risk for Falls Due to   No Fall Risks History of fall(s) No Fall Risks  Fall risk Follow up   Falls evaluation completed  Falls evaluation completed  Falls evaluation completed     Data saved with a previous flowsheet row definition     Past Medical History:  Past Medical History:  Diagnosis Date   Acute cholecystitis 10/04/2016   Asthma    Biliary colic    Dyshidrotic eczema    Gestational diabetes    Psoriasis    RUQ pain  Surgical History:  Past Surgical History:  Procedure Laterality Date   CESAREAN SECTION N/A 12/08/2020   Procedure: CESAREAN SECTION;  Surgeon: Connell Davies, MD;  Location: [facility] ORS;  Service: Obstetrics;  Laterality: N/A;   NO PAST SURGERIES      Medications:  Current Outpatient Medications on File Prior to Visit  Medication Sig   Norgestimate-Ethinyl Estradiol Triphasic (TRI-SPRINTEC) 0.18/0.215/0.25 MG-35 MCG tablet Take 1 tablet by mouth daily.   omeprazole  (PRILOSEC) 20 MG capsule Take 1 capsule (20 mg total) by mouth 2 (two) times daily before a meal. TAKE 1 CAPSULE(20 MG) BY MOUTH DAILY (Patient not taking: Reported on 03/23/2024)   No current facility-administered medications on file prior to visit.    Allergies:  No Known Allergies  Social History:  Social History   Socioeconomic History   Marital status: Married    Spouse name: Not on file   Number of children: 2   Years of education: Not on file   Highest education level: Not on file  Occupational History   Not on file  Tobacco Use   Smoking status: Never   Smokeless tobacco: Never  Vaping Use   Vaping status: Never Used  Substance and Sexual Activity   Alcohol use: No   Drug use: No   Sexual activity: Yes    Birth control/protection: Pill  Other Topics Concern   Not on file  Social History Narrative   Not on file    Social Drivers of Health   Financial Resource Strain: Low Risk  (07/16/2021)   Overall Financial Resource Strain (CARDIA)    Difficulty of Paying Living Expenses: Not hard at all  Food Insecurity: No Food Insecurity (07/16/2021)   Hunger Vital Sign    Worried About Running Out of Food in the Last Year: Never true    Ran Out of Food in the Last Year: Never true  Transportation Needs: No Transportation Needs (07/16/2021)   PRAPARE - Administrator, Civil Service (Medical): No    Lack of Transportation (Non-Medical): No  Physical Activity: Insufficiently Active (07/16/2021)   Exercise Vital Sign    Days of Exercise per Week: 2 days    Minutes of Exercise per Session: 30 min  Stress: No Stress Concern Present (07/16/2021)   [employer] of Occupational Health - Occupational Stress Questionnaire    Feeling of Stress : Only a little  Social Connections: Moderately Integrated (07/16/2021)   Social Connection and Isolation Panel    Frequency of Communication with Friends and Family: Three times a week    Frequency of Social Gatherings with Friends and Family: Three times a week    Attends Religious Services: More than 4 times per year    Active Member of Clubs or Organizations: No    Attends Banker Meetings: Never    Marital Status: Married  Catering manager Violence: Not At Risk (07/16/2021)   Humiliation, Afraid, Rape, and Kick questionnaire    Fear of Current or Ex-Partner: No    Emotionally Abused: No    Physically Abused: No    Sexually Abused: No   Social History   Tobacco Use  Smoking Status Never  Smokeless Tobacco Never   Social History   Substance and Sexual Activity  Alcohol Use No    Family History:  Family History  Problem Relation Age of Onset   Heart disease Mother    Diabetes Mother    Hypertension Father    Diabetes Father    Diabetes  Brother    Diabetes Paternal Grandmother    Diabetes Paternal Grandfather     Past  medical history, surgical history, medications, allergies, family history and social history reviewed with patient today and changes made to appropriate areas of the chart.   ROS All other ROS negative except what is listed above and in the HPI.      Objective:    BP 96/64   Pulse 87   Temp 98.2 F (36.8 C) (Oral)   Ht 5' 1.2 (1.554 m)   Wt 142 lb 9.6 oz (64.7 kg)   LMP 03/17/2024 (Approximate)   SpO2 98%   BMI 26.77 kg/m   Wt Readings from Last 3 Encounters:  03/23/24 142 lb 9.6 oz (64.7 kg)  03/08/24 146 lb 4.8 oz (66.4 kg)  07/17/22 147 lb 1.6 oz (66.7 kg)    Physical Exam Vitals and nursing note reviewed.  Constitutional:      General: She is awake. She is not in acute distress.    Appearance: She is well-developed and well-groomed. She is not ill-appearing or toxic-appearing.  HENT:     Head: Normocephalic and atraumatic.     Right Ear: Hearing, tympanic membrane, ear canal and external ear normal. No drainage.     Left Ear: Hearing, tympanic membrane, ear canal and external ear normal. No drainage.     Nose: Nose normal.     Right Sinus: No maxillary sinus tenderness or frontal sinus tenderness.     Left Sinus: No maxillary sinus tenderness or frontal sinus tenderness.     Mouth/Throat:     Mouth: Mucous membranes are moist.     Pharynx: Oropharynx is clear. Uvula midline. No pharyngeal swelling, oropharyngeal exudate or posterior oropharyngeal erythema.  Eyes:     General: Lids are normal.        Right eye: No discharge.        Left eye: No discharge.     Extraocular Movements: Extraocular movements intact.     Conjunctiva/sclera: Conjunctivae normal.     Pupils: Pupils are equal, round, and reactive to light.     Visual Fields: Right eye visual fields normal and left eye visual fields normal.  Neck:     Thyroid: No thyromegaly.     Vascular: No carotid bruit.     Trachea: Trachea normal.  Cardiovascular:     Rate and Rhythm: Normal rate and regular rhythm.      Heart sounds: Normal heart sounds. No murmur heard.    No gallop.  Pulmonary:     Effort: Pulmonary effort is normal. No accessory muscle usage or respiratory distress.     Breath sounds: Normal breath sounds.  Chest:     Comments: Deferred per request. Abdominal:     General: Bowel sounds are normal.     Palpations: Abdomen is soft. There is no hepatomegaly or splenomegaly.     Tenderness: There is no abdominal tenderness.  Musculoskeletal:        General: Normal range of motion.     Cervical back: Normal range of motion and neck supple.     Right lower leg: No edema.     Left lower leg: No edema.  Lymphadenopathy:     Head:     Right side of head: No submental, submandibular, tonsillar, preauricular or posterior auricular adenopathy.     Left side of head: No submental, submandibular, tonsillar, preauricular or posterior auricular adenopathy.     Cervical: No cervical adenopathy.  Skin:  General: Skin is warm and dry.     Capillary Refill: Capillary refill takes less than 2 seconds.     Findings: No rash.  Neurological:     Mental Status: She is alert and oriented to person, place, and time.     Gait: Gait is intact.     Deep Tendon Reflexes: Reflexes are normal and symmetric.     Reflex Scores:      Brachioradialis reflexes are 2+ on the right side and 2+ on the left side.      Patellar reflexes are 2+ on the right side and 2+ on the left side. Psychiatric:        Attention and Perception: Attention normal.        Mood and Affect: Mood normal.        Speech: Speech normal.        Behavior: Behavior normal. Behavior is cooperative.        Thought Content: Thought content normal.        Judgment: Judgment normal.     Results for orders placed or performed in visit on 03/08/24  WET PREP FOR TRICH, YEAST, CLUE   Collection Time: 03/08/24  2:39 PM   Specimen: Urine   Urine  Result Value Ref Range   Trichomonas Exam Negative Negative   Yeast Exam Negative Negative    Clue Cell Exam Negative Negative  Microscopic Examination   Collection Time: 03/08/24  2:39 PM   Urine  Result Value Ref Range   WBC, UA 0-5 0 - 5 /hpf   RBC, Urine None seen 0 - 2 /hpf   Epithelial Cells (non renal) 0-10 0 - 10 /hpf   Bacteria, UA Few None seen/Few  Urinalysis, Routine w reflex microscopic   Collection Time: 03/08/24  2:39 PM  Result Value Ref Range   Specific Gravity, UA 1.020 1.005 - 1.030   pH, UA 7.0 5.0 - 7.5   Color, UA Yellow Yellow   Appearance Ur Clear Clear   Leukocytes,UA Trace (A) Negative   Protein,UA Negative Negative/Trace   Glucose, UA Negative Negative   Ketones, UA Negative Negative   RBC, UA Negative Negative   Bilirubin, UA Negative Negative   Urobilinogen, Ur 0.2 0.2 - 1.0 mg/dL   Nitrite, UA Negative Negative   Microscopic Examination See below:   Pregnancy, urine   Collection Time: 03/08/24  2:39 PM  Result Value Ref Range   Preg Test, Ur Negative Negative  CBC with Differential/Platelet   Collection Time: 03/08/24  2:47 PM  Result Value Ref Range   WBC 6.5 3.4 - 10.8 x10E3/uL   RBC 4.57 3.77 - 5.28 x10E6/uL   Hemoglobin 14.1 11.1 - 15.9 g/dL   Hematocrit 58.0 65.9 - 46.6 %   MCV 92 79 - 97 fL   MCH 30.9 26.6 - 33.0 pg   MCHC 33.7 31.5 - 35.7 g/dL   RDW 87.2 88.2 - 84.5 %   Platelets 282 150 - 450 x10E3/uL   Neutrophils 56 Not Estab. %   Lymphs 34 Not Estab. %   Monocytes 6 Not Estab. %   Eos 3 Not Estab. %   Basos 1 Not Estab. %   Neutrophils Absolute 3.6 1.4 - 7.0 x10E3/uL   Lymphocytes Absolute 2.2 0.7 - 3.1 x10E3/uL   Monocytes Absolute 0.4 0.1 - 0.9 x10E3/uL   EOS (ABSOLUTE) 0.2 0.0 - 0.4 x10E3/uL   Basophils Absolute 0.1 0.0 - 0.2 x10E3/uL   Immature Granulocytes 0 Not Estab. %  Immature Grans (Abs) 0.0 0.0 - 0.1 x10E3/uL  Comprehensive metabolic panel with GFR   Collection Time: 03/08/24  2:47 PM  Result Value Ref Range   Glucose 70 70 - 99 mg/dL   BUN 8 6 - 20 mg/dL   Creatinine, Ser 9.44 (L) 0.57 - 1.00  mg/dL   eGFR 872 >40 fO/fpw/8.26   BUN/Creatinine Ratio 15 9 - 23   Sodium 140 134 - 144 mmol/L   Potassium 4.0 3.5 - 5.2 mmol/L   Chloride 105 96 - 106 mmol/L   CO2 22 20 - 29 mmol/L   Calcium 9.6 8.7 - 10.2 mg/dL   Total Protein 7.3 6.0 - 8.5 g/dL   Albumin 4.3 4.0 - 5.0 g/dL   Globulin, Total 3.0 1.5 - 4.5 g/dL   Bilirubin Total 0.2 0.0 - 1.2 mg/dL   Alkaline Phosphatase 47 44 - 121 IU/L   AST 19 0 - 40 IU/L   ALT 22 0 - 32 IU/L  TSH   Collection Time: 03/08/24  2:47 PM  Result Value Ref Range   TSH 1.140 0.450 - 4.500 uIU/mL      Assessment & Plan:   Problem List Items Addressed This Visit       Digestive   Gastroesophageal reflux disease - Primary   Chronic, ongoing.  No longer taking Omeprazole .  Recommend heavy focus on diet and monitoring foods that caused discomfort for her.  Risks of PPI use were discussed with patient including bone loss, C. Diff diarrhea, pneumonia, infections, CKD, electrolyte abnormalities.  Verbalizes understanding and chooses to continue the medication.  Mag level today.       Relevant Orders   Magnesium      Other   Vitamin D  deficiency   Noted on past labs, has not taken supplement consistently.  Recheck level today.      Relevant Orders   VITAMIN D  25 Hydroxy (Vit-D Deficiency, Fractures)   Elevated low density lipoprotein (LDL) cholesterol level   Noted on past labs -- recommend continued focus on diet and recheck labs today.      Relevant Orders   Lipid Panel w/o Chol/HDL Ratio   Other Visit Diagnoses       Vision changes       Referral to opthalmology placed.   Relevant Orders   Ambulatory referral to Ophthalmology     Other skin changes       Referral to dermatology per request.   Relevant Orders   Ambulatory referral to Dermatology     Encounter for annual physical exam       Annual physical today with labs and health maintenance reviewed, discussed with patient.        Follow up plan: Return in about 1 year  (around 03/23/2025) for Annual Physical.   LABORATORY TESTING:  - Pap smear: not up to date, refuses today  IMMUNIZATIONS:   - Tdap: Tetanus vaccination status reviewed: last tetanus booster within 10 years. - Influenza: Up to date - Pneumovax: Not applicable - Prevnar: Not applicable - COVID: Up to date - HPV: Not applicable - Shingrix vaccine: Not applicable  SCREENING: -Mammogram: Not applicable  - Colonoscopy: Not applicable  - Bone Density: Not applicable  -Hearing Test: Not applicable  -Spirometry: Not applicable   PATIENT COUNSELING:   Advised to take 1 mg of folate supplement per day if capable of pregnancy.   Sexuality: Discussed sexually transmitted diseases, partner selection, use of condoms, avoidance of unintended pregnancy  and contraceptive alternatives.  Advised to avoid cigarette smoking.  I discussed with the patient that most people either abstain from alcohol or drink within safe limits (<=14/week and <=4 drinks/occasion for males, <=7/weeks and <= 3 drinks/occasion for females) and that the risk for alcohol disorders and other health effects rises proportionally with the number of drinks per week and how often a drinker exceeds daily limits.  Discussed cessation/primary prevention of drug use and availability of treatment for abuse.   Diet: Encouraged to adjust caloric intake to maintain  or achieve ideal body weight, to reduce intake of dietary saturated fat and total fat, to limit sodium intake by avoiding high sodium foods and not adding table salt, and to maintain adequate dietary potassium and calcium preferably from fresh fruits, vegetables, and low-fat dairy products.    Stressed the importance of regular exercise  Injury prevention: Discussed safety belts, safety helmets, smoke detector, smoking near bedding or upholstery.   Dental health: Discussed importance of regular tooth brushing, flossing, and dental visits.    NEXT PREVENTATIVE PHYSICAL  DUE IN 1 YEAR. Return in about 1 year (around 03/23/2025) for Annual Physical.

## 2024-03-23 NOTE — Assessment & Plan Note (Signed)
 Chronic, ongoing.  No longer taking Omeprazole .  Recommend heavy focus on diet and monitoring foods that caused discomfort for her.  Risks of PPI use were discussed with patient including bone loss, C. Diff diarrhea, pneumonia, infections, CKD, electrolyte abnormalities.  Verbalizes understanding and chooses to continue the medication.  Mag level today.

## 2024-03-23 NOTE — Assessment & Plan Note (Signed)
Noted on past labs -- recommend continued focus on diet and recheck labs today.

## 2024-03-24 ENCOUNTER — Ambulatory Visit: Payer: Self-pay | Admitting: Nurse Practitioner

## 2024-03-24 LAB — LIPID PANEL W/O CHOL/HDL RATIO
Cholesterol, Total: 260 mg/dL — ABNORMAL HIGH (ref 100–199)
HDL: 57 mg/dL (ref >39)
LDL Chol Calc (NIH): 173 mg/dL — ABNORMAL HIGH (ref 0–99)
Triglycerides: 165 mg/dL — ABNORMAL HIGH (ref 0–149)
VLDL Cholesterol Cal: 30 mg/dL (ref 5–40)

## 2024-03-24 LAB — MAGNESIUM: Magnesium: 2 mg/dL (ref 1.6–2.3)

## 2024-03-24 LAB — VITAMIN D 25 HYDROXY (VIT D DEFICIENCY, FRACTURES): Vit D, 25-Hydroxy: 24.6 ng/mL — ABNORMAL LOW (ref 30.0–100.0)

## 2024-03-24 NOTE — Progress Notes (Signed)
 Contacted via MyChart  Good morning Cheryl Wong, your labs have returned: -Vitamin D  level remains a bit low, ensure you are taking Vitamin D3 2000 units daily which you can obtain over the counter. - Lipid panel continues to show elevations in LDL, bad cholesterol, and total cholesterol.  No medication needed. Continue focus on healthy diet and regular exercise. - Magnesium  level normal.  Any questions? Keep being stellar!!  Thank you for allowing me to participate in your care.  I appreciate you. Kindest regards, Nealy Karapetian

## 2024-04-02 ENCOUNTER — Ambulatory Visit: Payer: Self-pay | Admitting: Nurse Practitioner

## 2024-04-02 DIAGNOSIS — Z789 Other specified health status: Secondary | ICD-10-CM

## 2024-04-02 MED ORDER — OMEPRAZOLE 20 MG PO CPDR: 20.0000 mg | DELAYED_RELEASE_CAPSULE | Freq: Two times a day (BID) | ORAL | 1 refills | Status: AC

## 2024-04-02 MED ORDER — NORGESTIM-ETH ESTRAD TRIPHASIC 0.18/0.215/0.25 MG-35 MCG PO TABS
1.0000 | ORAL_TABLET | Freq: Every day | ORAL | 4 refills | Status: AC
Start: 2024-04-02 — End: ?

## 2024-04-02 NOTE — Telephone Encounter (Signed)
 FYI Only or Action Required?: Action required by provider: medication refill request and clinical question for provider.  Patient was last seen in primary care on 03/23/2024 by Cheryl Melanie DASEN, NP.  Called Nurse Triage reporting Medication Refill and New med fill.  Symptoms began Chronic.  Interventions attempted: Nothing.  Symptoms are: unchanged.  Triage Disposition: See PCP When Office is Open (Within 3 Days)  Patient/caregiver understands and will follow disposition?: No, wishes to speak with PCP  Copied from CRM [id number]. Topic: Clinical - Medication Question >> Apr 02, 2024 11:26 AM Cheryl Wong wrote: Reason for CRM: Patient is requesting a refill on 2 medications, unsure of name she stated birth control meds and one other. Requested names but patient unsure. Please contact her Reason for Disposition  Prescription request for new medicine (not a refill)  Answer Assessment - Initial Assessment Questions 1. DRUG NAME: What medicine do you need to have refilled?     Birth Control and Omeprozole refills.    New order for Zyrtec and Proair . She has used this regimen in the past for seasonal allergies with good effect.   Declines triage and/or need for an appointment at this time. States no new symptoms, seasonal.  Protocols used: Medication Refill and Renewal Call-A-AH

## 2024-04-02 NOTE — Telephone Encounter (Signed)
 Both of these medicines were refilled 3 weeks ago. I'm unsure what they're asking or what they need

## 2024-04-06 ENCOUNTER — Other Ambulatory Visit: Payer: Self-pay | Admitting: Nurse Practitioner

## 2024-04-08 NOTE — Telephone Encounter (Signed)
 Requested Prescriptions  Refused Prescriptions Disp Refills   omeprazole  (PRILOSEC) 20 MG capsule [Pharmacy Med Name: OMEPRAZOLE  20MG  CAPSULES] 90 capsule     Sig: TAKE 1 CAPSULE(20 MG) BY MOUTH DAILY     Gastroenterology: Proton Pump Inhibitors Passed - 04/08/2024  3:21 PM      Passed - Valid encounter within last 12 months    Recent Outpatient Visits           2 weeks ago Gastroesophageal reflux disease without esophagitis   [place] [facility] [place], [place] T, NP   1 month ago Generalized abdominal pain   [place] [facility] [place], Melanie T, NP       Future Appointments             In 6 months Alm Delon SAILOR, DO [facility]             loratadine  (CLARITIN ) 10 MG tablet [Pharmacy Med Name: ALLERGY RELF (LORATADINE ) 10MG  TABS] 30 tablet 11    Sig: TAKE 1 TABLET(10 MG) BY MOUTH DAILY     Ear, Nose, and Throat:  Antihistamines 2 Failed - 04/08/2024  3:21 PM      Failed - Cr in normal range and within 360 days    Creatinine, Ser  Date Value Ref Range Status  03/08/2024 0.55 (L) 0.57 - 1.00 mg/dL Final   Creatinine, Urine  Date Value Ref Range Status  11/20/2016 31 mg/dL Final         Passed - Valid encounter within last 12 months    Recent Outpatient Visits           2 weeks ago Gastroesophageal reflux disease without esophagitis   [place] [facility] [place], Melanie T, NP   1 month ago Generalized abdominal pain   [place] [facility] [place] Melanie DASEN, NP       Future Appointments             In 6 months Alm Delon SAILOR, DO [facility]

## 2024-05-01 NOTE — Patient Instructions (Incomplete)
 Abdominal Pain, Adult    Many things can cause belly (abdominal) pain. In most cases, belly pain is not a serious problem and can be watched and treated at home. But in some cases, it can be serious.  Your doctor will try to find the cause of your belly pain.  Follow these instructions at home:  Medicines  Take over-the-counter and prescription medicines only as told by your doctor.  Do not take medicines that help you poop (laxatives) unless told by your doctor.  General instructions  Watch your belly pain for any changes. Tell your doctor if the pain gets worse.  Drink enough fluid to keep your pee (urine) pale yellow.  Contact a doctor if:  Your belly pain changes or gets worse.  You have very bad cramping or bloating in your belly.  You vomit.  Your pain gets worse with meals, after eating, or with certain foods.  You have trouble pooping or have watery poop for more than 2-3 days.  You are not hungry, or you lose weight without trying.  You have signs of not getting enough fluid or water (dehydration). These may include:  Dark pee, very Chastain pee, or no pee.  Cracked lips or dry mouth.  Feeling sleepy or weak.  You have pain when you pee or poop.  Your belly pain wakes you up at night.  You have blood in your pee.  You have a fever.  Get help right away if:  You cannot stop vomiting.  Your pain is only in one part of your belly, like on the right side.  You have bloody or black poop, or poop that looks like tar.  You have trouble breathing.  You have chest pain.  These symptoms may be an emergency. Get help right away. Call 911.  Do not wait to see if the symptoms will go away.  Do not drive yourself to the hospital.  This information is not intended to replace advice given to you by your health care provider. Make sure you discuss any questions you have with your health care provider.  Document Revised: 06/19/2022 Document Reviewed: 06/19/2022  Elsevier Patient Education  2024 [employer].

## 2024-05-05 ENCOUNTER — Ambulatory Visit: Admitting: Nurse Practitioner

## 2024-06-15 ENCOUNTER — Ambulatory Visit: Payer: Self-pay

## 2024-06-15 ENCOUNTER — Telehealth: Payer: Self-pay

## 2024-06-15 NOTE — Telephone Encounter (Signed)
 FYI Only or Action Required?: FYI only for provider.  Patient was last seen in primary care on 03/23/2024 by Valerio Melanie DASEN, NP.  Called Nurse Triage reporting Otalgia.  Symptoms began x 3 days ago.  Interventions attempted: OTC medications: Tylenol .  Symptoms are: unchanged.  Triage Disposition: See Physician Within 24 Hours  Patient/caregiver understands and will follow disposition?: Yes  **Appt. Scheduled for 10/2**        Copied from CRM #[id number]. Topic: Clinical - Red Word Triage >> Jun 15, 2024  8:33 AM Treva T wrote: Red Word that prompted transfer to Nurse Triage: Patient calling, state she thinks she has an ear infection.   Symptoms are left ear pain, and head, and facial pain. Reason for Disposition  Earache  (Exceptions: Brief ear pain of lasting less than 60 minutes, or earache occurring during air travel.)  Answer Assessment - Initial Assessment Questions 1. LOCATION: Which ear is involved?     Left ear  2. ONSET: When did the ear pain start?      X 3 day   3. SEVERITY: How bad is the pain?  (Scale 1-10; mild, moderate or severe)     6/10  4. URI SYMPTOMS: Do you have a runny nose or cough?     No   5. FEVER: Do you have a fever? If Yes, ask: What is your temperature, how was it measured, and when did it start?     NO  6. CAUSE: Have you been swimming recently?, How often do you use Q-TIPS?, Have you had any recent air travel or scuba diving?     NO  7. OTHER SYMPTOMS: Do you have any other symptoms? (e.g., decreased hearing, dizziness, headache, stiff neck, vomiting)     Headache, no there symptoms noted   8. PREGNANCY: Is there any chance you are pregnant? When was your last menstrual period?     No   Patient has taken Tylenol  for the ear pain, headache. Patient scheduled for 10/2 in office; if there is a sooner appt. Avail; patient would like to be notified.  Protocols used: [place]

## 2024-06-15 NOTE — Telephone Encounter (Signed)
 Noted

## 2024-06-15 NOTE — Telephone Encounter (Signed)
 Copied from CRM #[id number]. Topic: Referral - Question >> Jun 15, 2024  8:26 AM Treva T wrote: Reason for CRM: Patient calling, states she was referred to [facility] for vision changes, and seen for an appointment on 05/06/24. Patient reports at that appointment it was recommended that she needed eye glasses, and would refer her to an office that could assist with her getting glasses.  Per patient she has not heard back from the office, has called the office at [phone], but no response, or return call after leaving multiple messages.  Patient would like to know what to do going forward.   Patient can be reached at [phone], to advise further.   Patient is aware of same day call back.

## 2024-06-17 ENCOUNTER — Ambulatory Visit: Admitting: Nurse Practitioner

## 2024-06-21 NOTE — Telephone Encounter (Signed)
 Ok for E2C2 to review.  Please advise of the below information. Referrals are generally not needed for this and patient can just call and schedule.

## 2024-07-01 ENCOUNTER — Other Ambulatory Visit: Payer: Self-pay | Admitting: Nurse Practitioner

## 2024-07-01 DIAGNOSIS — Z789 Other specified health status: Secondary | ICD-10-CM

## 2024-07-02 NOTE — Telephone Encounter (Signed)
 Requested Prescriptions  Pending Prescriptions Disp Refills   TRI-SPRINTEC 0.18/0.215/0.25 MG-35 MCG tablet [Pharmacy Med Name: TRI-SPRINTEC TABLETS 28S] 84 tablet 4    Sig: TAKE 1 TABLET BY MOUTH DAILY     OB/GYN:  Contraceptives Passed - 07/02/2024  4:46 PM      Passed - Last BP in normal range    BP Readings from Last 1 Encounters:  03/23/24 96/64         Passed - Valid encounter within last 12 months    Recent Outpatient Visits           3 months ago Gastroesophageal reflux disease without esophagitis   [place] [facility] [place], Melanie T, NP   3 months ago Generalized abdominal pain   [place] [facility] [place], Melanie DASEN, NP       Future Appointments             In 4 months Alm Delon SAILOR, DO [facility]            Passed - Patient is not a smoker

## 2024-11-02 ENCOUNTER — Ambulatory Visit: Admitting: Dermatology

## 2025-03-25 ENCOUNTER — Encounter: Admitting: Nurse Practitioner
